# Patient Record
Sex: Male | Born: 1965 | Race: Black or African American | Hispanic: No | Marital: Single | Smoking: Former smoker
Health system: Southern US, Community
[De-identification: ages and names within clinical notes are randomized; demographics above are authoritative.]

## PROBLEM LIST (undated history)

## (undated) DIAGNOSIS — E669 Obesity, unspecified: Secondary | ICD-10-CM

## (undated) DIAGNOSIS — E785 Hyperlipidemia, unspecified: Secondary | ICD-10-CM

## (undated) DIAGNOSIS — Z9189 Other specified personal risk factors, not elsewhere classified: Secondary | ICD-10-CM

## (undated) DIAGNOSIS — E119 Type 2 diabetes mellitus without complications: Secondary | ICD-10-CM

## (undated) HISTORY — DX: Type 2 diabetes mellitus without complications: E11.9

## (undated) HISTORY — PX: COLONOSCOPY: SHX174

## (undated) HISTORY — PX: NO PAST SURGERIES: SHX2092

## (undated) HISTORY — DX: Other specified personal risk factors, not elsewhere classified: Z91.89

## (undated) HISTORY — DX: Hyperlipidemia, unspecified: E78.5

---

## 2004-12-17 ENCOUNTER — Emergency Department (HOSPITAL_COMMUNITY): Admission: EM | Admit: 2004-12-17 | Discharge: 2004-12-17 | Payer: Self-pay | Admitting: Emergency Medicine

## 2007-01-07 ENCOUNTER — Emergency Department (HOSPITAL_COMMUNITY): Admission: EM | Admit: 2007-01-07 | Discharge: 2007-01-07 | Payer: Self-pay | Admitting: *Deleted

## 2007-01-12 ENCOUNTER — Emergency Department (HOSPITAL_COMMUNITY): Admission: EM | Admit: 2007-01-12 | Discharge: 2007-01-12 | Payer: Self-pay | Admitting: Family Medicine

## 2008-04-30 ENCOUNTER — Emergency Department (HOSPITAL_COMMUNITY): Admission: EM | Admit: 2008-04-30 | Discharge: 2008-04-30 | Payer: Self-pay | Admitting: Emergency Medicine

## 2012-08-21 ENCOUNTER — Emergency Department (INDEPENDENT_AMBULATORY_CARE_PROVIDER_SITE_OTHER): Payer: Self-pay

## 2012-08-21 ENCOUNTER — Emergency Department (INDEPENDENT_AMBULATORY_CARE_PROVIDER_SITE_OTHER)
Admission: EM | Admit: 2012-08-21 | Discharge: 2012-08-21 | Disposition: A | Discharge status: Home / Self Care | Payer: Self-pay | Source: Home / Self Care | Attending: Family Medicine | Admitting: Family Medicine

## 2012-08-21 DIAGNOSIS — S93402A Sprain of unspecified ligament of left ankle, initial encounter: Secondary | ICD-10-CM

## 2012-08-21 DIAGNOSIS — S93409A Sprain of unspecified ligament of unspecified ankle, initial encounter: Secondary | ICD-10-CM

## 2012-08-21 MED ORDER — HYDROCODONE-ACETAMINOPHEN 5-500 MG PO TABS: 1.0000 | ORAL_TABLET | Freq: Four times a day (QID) | ORAL | Status: DC | PRN

## 2012-08-21 MED ORDER — IBUPROFEN 600 MG PO TABS: 600.0000 mg | ORAL_TABLET | Freq: Three times a day (TID) | ORAL | Status: DC | PRN

## 2012-08-21 MED ORDER — HYDROCODONE-ACETAMINOPHEN 5-325 MG PO TABS
ORAL_TABLET | ORAL | Status: AC
  Filled 2012-08-21: qty 1

## 2012-08-21 MED ORDER — HYDROCODONE-ACETAMINOPHEN 5-325 MG PO TABS
1.0000 | ORAL_TABLET | Freq: Once | ORAL | Status: AC
  Administered 2012-08-21: 1 via ORAL
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
## 2014-09-20 ENCOUNTER — Inpatient Hospital Stay (HOSPITAL_COMMUNITY)
Admission: EM | Admit: 2014-09-20 | Discharge: 2014-09-22 | DRG: 638 | Disposition: A | Discharge status: Home / Self Care | Payer: Self-pay | Attending: Internal Medicine | Admitting: Internal Medicine

## 2014-09-20 ENCOUNTER — Encounter (HOSPITAL_COMMUNITY): Payer: Self-pay | Admitting: Emergency Medicine

## 2014-09-20 DIAGNOSIS — Z87891 Personal history of nicotine dependence: Secondary | ICD-10-CM

## 2014-09-20 DIAGNOSIS — R739 Hyperglycemia, unspecified: Secondary | ICD-10-CM | POA: Diagnosis present

## 2014-09-20 DIAGNOSIS — E119 Type 2 diabetes mellitus without complications: Secondary | ICD-10-CM

## 2014-09-20 DIAGNOSIS — N19 Unspecified kidney failure: Secondary | ICD-10-CM | POA: Insufficient documentation

## 2014-09-20 DIAGNOSIS — N289 Disorder of kidney and ureter, unspecified: Secondary | ICD-10-CM

## 2014-09-20 DIAGNOSIS — E8729 Other acidosis: Secondary | ICD-10-CM | POA: Diagnosis present

## 2014-09-20 DIAGNOSIS — B351 Tinea unguium: Secondary | ICD-10-CM | POA: Diagnosis present

## 2014-09-20 DIAGNOSIS — E872 Acidosis: Secondary | ICD-10-CM | POA: Diagnosis present

## 2014-09-20 DIAGNOSIS — N179 Acute kidney failure, unspecified: Secondary | ICD-10-CM | POA: Diagnosis present

## 2014-09-20 DIAGNOSIS — Z6835 Body mass index (BMI) 35.0-35.9, adult: Secondary | ICD-10-CM

## 2014-09-20 DIAGNOSIS — E860 Dehydration: Secondary | ICD-10-CM | POA: Diagnosis present

## 2014-09-20 DIAGNOSIS — E669 Obesity, unspecified: Secondary | ICD-10-CM | POA: Diagnosis present

## 2014-09-20 DIAGNOSIS — E1310 Other specified diabetes mellitus with ketoacidosis without coma: Principal | ICD-10-CM | POA: Diagnosis present

## 2014-09-20 DIAGNOSIS — R748 Abnormal levels of other serum enzymes: Secondary | ICD-10-CM | POA: Diagnosis present

## 2014-09-20 DIAGNOSIS — N189 Chronic kidney disease, unspecified: Secondary | ICD-10-CM | POA: Diagnosis present

## 2014-09-20 HISTORY — DX: Obesity, unspecified: E66.9

## 2014-09-20 LAB — COMPREHENSIVE METABOLIC PANEL
ALBUMIN: 4 g/dL (ref 3.5–5.2)
ALT: 20 U/L (ref 0–53)
ANION GAP: 17 — AB (ref 5–15)
AST: 19 U/L (ref 0–37)
Alkaline Phosphatase: 244 U/L — ABNORMAL HIGH (ref 39–117)
BILIRUBIN TOTAL: 1.2 mg/dL (ref 0.3–1.2)
BUN: 11 mg/dL (ref 6–23)
CHLORIDE: 94 meq/L — AB (ref 96–112)
CO2: 18 mmol/L — AB (ref 19–32)
CREATININE: 1.26 mg/dL (ref 0.50–1.35)
Calcium: 9.4 mg/dL (ref 8.4–10.5)
GFR calc non Af Amer: 66 mL/min — ABNORMAL LOW (ref >90)
GFR, EST AFRICAN AMERICAN: 77 mL/min — AB (ref >90)
GLUCOSE: 661 mg/dL — AB (ref 70–99)
Potassium: 4.4 mmol/L (ref 3.5–5.1)
SODIUM: 129 mmol/L — AB (ref 135–145)
Total Protein: 7.1 g/dL (ref 6.0–8.3)

## 2014-09-20 LAB — URINALYSIS, ROUTINE W REFLEX MICROSCOPIC
BILIRUBIN URINE: NEGATIVE
Glucose, UA: >1000 mg/dL — AB
KETONES UR: 40 mg/dL — AB
LEUKOCYTES UA: NEGATIVE
Nitrite: NEGATIVE
PROTEIN: NEGATIVE mg/dL
SPECIFIC GRAVITY, URINE: 1.035 — AB (ref 1.005–1.030)
UROBILINOGEN UA: 0.2 mg/dL (ref 0.0–1.0)
pH: 5 (ref 5.0–8.0)

## 2014-09-20 LAB — BASIC METABOLIC PANEL
ANION GAP: 14 (ref 5–15)
Anion gap: 7 (ref 5–15)
BUN: 8 mg/dL (ref 6–23)
BUN: 6 mg/dL (ref 6–23)
CALCIUM: 8.3 mg/dL — AB (ref 8.4–10.5)
CHLORIDE: 103 meq/L (ref 96–112)
CO2: 19 mmol/L (ref 19–32)
CO2: 20 mmol/L (ref 19–32)
CREATININE: 1.1 mg/dL (ref 0.50–1.35)
Calcium: 8.6 mg/dL (ref 8.4–10.5)
Chloride: 109 mEq/L (ref 96–112)
Creatinine, Ser: 0.93 mg/dL (ref 0.50–1.35)
GFR calc Af Amer: >90 mL/min (ref >90)
GFR calc Af Amer: >90 mL/min (ref >90)
GFR calc non Af Amer: >90 mL/min (ref >90)
GFR, EST NON AFRICAN AMERICAN: 78 mL/min — AB (ref >90)
GLUCOSE: 253 mg/dL — AB (ref 70–99)
GLUCOSE: 162 mg/dL — AB (ref 70–99)
Potassium: 3.4 mmol/L — ABNORMAL LOW (ref 3.5–5.1)
Potassium: 3.2 mmol/L — ABNORMAL LOW (ref 3.5–5.1)
SODIUM: 136 mmol/L (ref 135–145)
SODIUM: 136 mmol/L (ref 135–145)

## 2014-09-20 LAB — GLUCOSE, CAPILLARY
GLUCOSE-CAPILLARY: 132 mg/dL — AB (ref 70–99)
GLUCOSE-CAPILLARY: 134 mg/dL — AB (ref 70–99)
Glucose-Capillary: 128 mg/dL — ABNORMAL HIGH (ref 70–99)
Glucose-Capillary: 130 mg/dL — ABNORMAL HIGH (ref 70–99)

## 2014-09-20 LAB — CBG MONITORING, ED
GLUCOSE-CAPILLARY: 406 mg/dL — AB (ref 70–99)
GLUCOSE-CAPILLARY: 249 mg/dL — AB (ref 70–99)
GLUCOSE-CAPILLARY: 167 mg/dL — AB (ref 70–99)
Glucose-Capillary: 582 mg/dL (ref 70–99)
Glucose-Capillary: 305 mg/dL — ABNORMAL HIGH (ref 70–99)
Glucose-Capillary: 244 mg/dL — ABNORMAL HIGH (ref 70–99)
Glucose-Capillary: 211 mg/dL — ABNORMAL HIGH (ref 70–99)
Glucose-Capillary: 197 mg/dL — ABNORMAL HIGH (ref 70–99)
Glucose-Capillary: 148 mg/dL — ABNORMAL HIGH (ref 70–99)

## 2014-09-20 LAB — URINE MICROSCOPIC-ADD ON

## 2014-09-20 LAB — CBC
HCT: 42.8 % (ref 39.0–52.0)
HEMOGLOBIN: 14.8 g/dL (ref 13.0–17.0)
MCH: 30.5 pg (ref 26.0–34.0)
MCHC: 34.6 g/dL (ref 30.0–36.0)
MCV: 88.2 fL (ref 78.0–100.0)
Platelets: 150 10*3/uL (ref 150–400)
RBC: 4.85 MIL/uL (ref 4.22–5.81)
RDW: 12.5 % (ref 11.5–15.5)
WBC: 7.3 10*3/uL (ref 4.0–10.5)

## 2014-09-20 LAB — MRSA PCR SCREENING: MRSA BY PCR: NEGATIVE

## 2014-09-20 LAB — HEMOGLOBIN A1C
HEMOGLOBIN A1C: 13.2 % — AB (ref <5.7)
MEAN PLASMA GLUCOSE: 332 mg/dL — AB (ref <117)

## 2014-09-20 LAB — I-STAT VENOUS BLOOD GAS, ED
Acid-base deficit: 7 mmol/L — ABNORMAL HIGH (ref 0.0–2.0)
BICARBONATE: 17.8 meq/L — AB (ref 20.0–24.0)
O2 Saturation: 93 %
PCO2 VEN: 32.2 mmHg — AB (ref 45.0–50.0)
PH VEN: 7.351 — AB (ref 7.250–7.300)
PO2 VEN: 70 mmHg — AB (ref 30.0–45.0)
TCO2: 19 mmol/L (ref 0–100)

## 2014-09-20 MED ORDER — INSULIN GLARGINE 100 UNIT/ML ~~LOC~~ SOLN
10.0000 [IU] | Freq: Every day | SUBCUTANEOUS | Status: DC
  Administered 2014-09-20: 10 [IU] via SUBCUTANEOUS
  Filled 2014-09-20 (×2): qty 0.1

## 2014-09-20 MED ORDER — INSULIN ASPART 100 UNIT/ML ~~LOC~~ SOLN
0.0000 [IU] | Freq: Three times a day (TID) | SUBCUTANEOUS | Status: DC
  Administered 2014-09-21: 3 [IU] via SUBCUTANEOUS
  Administered 2014-09-21: 7 [IU] via SUBCUTANEOUS

## 2014-09-20 MED ORDER — ACETAMINOPHEN 650 MG RE SUPP: 650.0000 mg | Freq: Four times a day (QID) | RECTAL | Status: DC | PRN

## 2014-09-20 MED ORDER — ONDANSETRON HCL 4 MG/2ML IJ SOLN: 4.0000 mg | Freq: Four times a day (QID) | INTRAMUSCULAR | Status: DC | PRN

## 2014-09-20 MED ORDER — HEPARIN SODIUM (PORCINE) 5000 UNIT/ML IJ SOLN
5000.0000 [IU] | Freq: Three times a day (TID) | INTRAMUSCULAR | Status: DC
  Administered 2014-09-20 – 2014-09-22 (×5): 5000 [IU] via SUBCUTANEOUS
  Filled 2014-09-20 (×10): qty 1

## 2014-09-20 MED ORDER — DEXTROSE-NACL 5-0.45 % IV SOLN
INTRAVENOUS | Status: DC
  Administered 2014-09-20: 14:00:00 via INTRAVENOUS

## 2014-09-20 MED ORDER — INSULIN GLARGINE 100 UNIT/ML ~~LOC~~ SOLN
10.0000 [IU] | Freq: Every day | SUBCUTANEOUS | Status: DC
  Filled 2014-09-20: qty 0.1

## 2014-09-20 MED ORDER — SODIUM CHLORIDE 0.9 % IV SOLN
1000.0000 mL | Freq: Once | INTRAVENOUS | Status: AC
  Administered 2014-09-20: 1000 mL via INTRAVENOUS

## 2014-09-20 MED ORDER — HEPARIN SODIUM (PORCINE) 5000 UNIT/ML IJ SOLN
5000.0000 [IU] | Freq: Three times a day (TID) | INTRAMUSCULAR | Status: DC
  Administered 2014-09-20: 5000 [IU] via SUBCUTANEOUS

## 2014-09-20 MED ORDER — ACETAMINOPHEN 325 MG PO TABS: 650.0000 mg | ORAL_TABLET | Freq: Four times a day (QID) | ORAL | Status: DC | PRN

## 2014-09-20 MED ORDER — SODIUM CHLORIDE 0.9 % IV SOLN
INTRAVENOUS | Status: DC
  Administered 2014-09-20: 3.5 [IU]/h via INTRAVENOUS
  Filled 2014-09-20: qty 2.5

## 2014-09-20 MED ORDER — SODIUM CHLORIDE 0.9 % IV SOLN
1000.0000 mL | INTRAVENOUS | Status: DC
  Administered 2014-09-20 – 2014-09-21 (×4): 1000 mL via INTRAVENOUS

## 2014-09-20 MED ORDER — POTASSIUM CHLORIDE 10 MEQ/100ML IV SOLN
10.0000 meq | INTRAVENOUS | Status: AC
  Filled 2014-09-20: qty 100

## 2014-09-20 MED ORDER — POTASSIUM CHLORIDE 10 MEQ/100ML IV SOLN
10.0000 meq | INTRAVENOUS | Status: AC
  Administered 2014-09-20 (×3): 10 meq via INTRAVENOUS
  Filled 2014-09-20: qty 100

## 2014-09-20 MED ORDER — ONDANSETRON HCL 4 MG PO TABS: 4.0000 mg | ORAL_TABLET | Freq: Four times a day (QID) | ORAL | Status: DC | PRN

## 2014-09-20 MED ORDER — SODIUM CHLORIDE 0.9 % IV BOLUS (SEPSIS)
1000.0000 mL | Freq: Once | INTRAVENOUS | Status: AC
  Administered 2014-09-20: 1000 mL via INTRAVENOUS

## 2014-09-20 NOTE — ED Notes (Addendum)
Pt c/o drinking too much water x 1 month. Pt reports drinking about 40-50 cups of water. CBG 582 in triage. Pt does not have history of diabetes.

## 2014-09-20 NOTE — H&P (Signed)
Date: 09/20/2014               Patient Name:  Dustin Hale MRN: [id number]  DOB: 1966/03/09 Age / Sex: 49 y.o., male   PCP: No primary care provider on file.         Medical Service: Internal Medicine Teaching Service         Attending Physician: Dr. Sid Falcon, MD    First Contact: Dr. Posey Pronto Pager: [phone]  Second Contact: Dr. Denton Brick Pager: [phone]       After Hours (After 5p/  First Contact Pager: [phone]  weekends / holidays): Second Contact Pager: [phone]   Chief Complaint: Polyuria, Polydipsia   History of Present Illness:  Dustin Hale is a 49 yr old man with PMH of tobacco use (16 pack-year quit in Dec 2015), obesity, no medical follow up in years, presenting with increased urinary frequency and thirst for the past month. He states that he was in his usual state of health until 1 month ago when his symptoms started. He thought he had an UTI so he took antibiotics from his "buddy" that were unexpired. His symptoms did not improve and he developed intermittent scant white penile discharge but no ulceration, dysuria, or rash. The penile discharge has resolved now. He denies fever/chills, N/V/D, or upper respiratory symptoms. He had chest pain months ago but none recently. He states that he has stopped smoking and drinking alcohol because he was urinating so much. He has no family history of diabetes but reports eating sweets and snacks frequently.   In the ED he was found to be hemodynamically stable with blood glucose of 661 with AG of 17, bicarb of 18, Venous blood gas with pH of 7.351/32/70/bicarb 17, UA with >1000 glucose, 40 ketones, no leucocytes, rare bacteria. He was given 2L NS bolus and promptly started on insulin drip and IVF. The IMTS was called for his hospitalization for further management of his hyperglycemia.   Meds: Current Facility-Administered Medications  Medication Dose Route Frequency Provider Last Rate Last Dose  . 0.9 %  sodium chloride infusion  1,000 mL  Intravenous Once [employer]. Pickering, MD 1,000 mL/hr at 09/20/14 1116 1,000 mL at 09/20/14 1116  . 0.9 %  sodium chloride infusion  1,000 mL Intravenous Continuous Jasper Riling. Pickering, MD      . dextrose 5 %-0.45 % sodium chloride infusion   Intravenous Continuous Jasper Riling. Pickering, MD      . insulin regular (NOVOLIN R,HUMULIN R) 250 Units in sodium chloride 0.9 % 250 mL (1 Units/mL) infusion   Intravenous Continuous Nathan R. Pickering, MD 3.5 mL/hr at 09/20/14 1115 3.5 Units/hr at 09/20/14 1115   No current outpatient prescriptions on file.    Allergies: Allergies as of 09/20/2014  . (No Known Allergies)   Past Medical History  Diagnosis Date  . Obesity    History reviewed. No pertinent past surgical history. Family History  Problem Relation Age of Onset  . Hypertension Mother   . Hypertension Father    History   Social History  . Marital Status: Single    Spouse Name: N/A    Number of Children: N/A  . Years of Education: N/A   Occupational History  . Not on file.   Social History Main Topics  . Smoking status: Former Smoker -- 0.50 packs/day for 32 years    Types: Cigarettes  . Smokeless tobacco: Never Used     Comment: quit smoking on 08/27/14  . Alcohol  Use: No     Comment: Used to drink heavily, quit in 2014  . Drug Use: No     Comment: Former  . Sexual Activity: Not on file   Other Topics Concern  . Not on file   Social History Narrative    Review of Systems: .Review of Systems  Constitutional: Negative for fever, chills, weight loss, malaise/fatigue and diaphoresis.  HENT: Negative for congestion and sore throat.   Eyes: Negative for blurred vision.  Respiratory: Negative for cough and shortness of breath.   Cardiovascular: Negative for chest pain and leg swelling.  Gastrointestinal: Negative for nausea, vomiting, abdominal pain and diarrhea.       Polydipsia   Genitourinary: Positive for frequency. Negative for dysuria and flank pain.        Polyuria   Musculoskeletal: Negative for myalgias.  Skin: Negative for rash.  Neurological: Negative for dizziness, weakness and headaches.  Psychiatric/Behavioral: Negative for depression. The patient is not nervous/anxious.      Physical Exam: Blood pressure 128/76, pulse 84, temperature 98.4 F (36.9 C), temperature source Oral, resp. rate 16, height _0  (1.753 m), weight 240 lb (108.863 kg), SpO2 97 %. Physical Exam  Nursing note and vitals reviewed. Constitutional: He is oriented to person, place, and time. No distress.  Obese  HENT:  Head: Normocephalic and atraumatic.  Mouth/Throat: Oropharynx is clear and moist. No oropharyngeal exudate.  Moist MM  Eyes: Conjunctivae and EOM are normal. Pupils are equal, round, and reactive to light. Right eye exhibits no discharge. Left eye exhibits no discharge. No scleral icterus.  Neck: Neck supple.  Cardiovascular: Normal rate and regular rhythm.   Respiratory: Effort normal and breath sounds normal. No respiratory distress. He has no wheezes. He has no rales.  GI: Soft. Bowel sounds are normal. There is no tenderness. There is no rebound and no guarding.  Genitourinary:  Exam deferred    Musculoskeletal: He exhibits no edema or tenderness.  Lymphadenopathy:    He has no cervical adenopathy.  Neurological: He is alert and oriented to person, place, and time.  Skin: Skin is warm and dry. He is not diaphoretic. No erythema.  Psychiatric: He has a normal mood and affect.     Lab results: Basic Metabolic Panel:  Recent Labs  09/20/14 0830  NA 129*  K 4.4  CL 94*  CO2 18*  GLUCOSE 661*  BUN 11  CREATININE 1.26  CALCIUM 9.4   Liver Function Tests:  Recent Labs  09/20/14 0830  AST 19  ALT 20  ALKPHOS 244*  BILITOT 1.2  PROT 7.1  ALBUMIN 4.0   CBC:  Recent Labs  09/20/14 0830  WBC 7.3  HGB 14.8  HCT 42.8  MCV 88.2  PLT 150   CBG:  Recent Labs  09/20/14 0730 09/20/14 1110  GLUCAP 582* 406*    Urinalysis:  Recent Labs  09/20/14 0824  COLORURINE YELLOW  LABSPEC 1.035*  PHURINE 5.0  GLUCOSEU >1000*  HGBUR TRACE*  BILIRUBINUR NEGATIVE  KETONESUR 40*  PROTEINUR NEGATIVE  UROBILINOGEN 0.2  NITRITE NEGATIVE  LEUKOCYTESUR NEGATIVE     Assessment & Plan by Problem: 49 yr old man with PMH of tobacco smoking, obesity, with no recent medical follow up, presenting with symptomatic hyperglycemia with increased AG metabolic acidosis, likely due to new onset DM2.   Hyperglycemia: Blood glucose of 661 on presentation. Has had polydipsia and polyuria for one month. Risk factor to include obesity, family history. No signs of acute infection that  could have triggered his hyperglycemia.  -Admit to SDU -Continue insulin drip with CBG q1h -BMET q4h -NS at 113m.hr, followed by D5NS if CBG <250 -After AG gap with 2 repeated BMETs, give Lantus 10 units, wait 1-2 hr for pt to eat carb mod diet then start SSI -Gentle potassium repletion as needed (based on BMET) given his decrease in GFR -NPO for now -Check HgbA1c  -Pending HgA1c, start diabetes medications, order diabetic teaching/diabetes coordinator consult -Will need PCP follow up, former [facility] patient, [facility]  Renal insufficiency, AKI v CKD: Pt has not had medical follow up with no labs in Epic. His Cr at presentation is 1.26 with GFR of 66. He may have chronic kidney disease due to uncontrolled diabetes.  -trend creatinine after IVF -Gentle potassium repletion   Elevated Alkaline phosphatase: No baseline. No hx of liver disease with normal AST, ALT, and bilirubin of only 1.2. He has no abdominal pain, N/V, or signs of obstruction. Isolated elevation of Alk phos can be seen with bone disease and renal disease as well (he may have CKD per above discussion) -CMP in am  Hx ofTobacco use: Has history of 16 pack-year cigarette smoking, quit on 08/27/14. Does not feel the need for  nicotine patch at this time.    DVT prophylaxis: Heparin [place] TID  FEN:  NS 1235mhr  Follow BMET, replete potassium gently as needed NPO for now, Carb mod diet once gap is closed x2 BMETs   Dispo: Disposition is deferred at this time, awaiting improvement of current medical problems. Anticipated discharge in approximately 1-2 day(s).   The patient does not have a current PCP and [facility].  The patient does not have transportation limitations that hinder transportation to clinic appointments.  Signed: SoBlain PaisMD  IMTS, PGY3 09/20/2014, 11:37 AM

## 2014-09-20 NOTE — ED Provider Notes (Signed)
CSN: [id number]     Arrival date & time 09/20/14  0715 History   First MD Initiated Contact with Patient 09/20/14 0813     Chief Complaint  Patient presents with  . Polydipsia     (Consider location/radiation/quality/duration/timing/severity/associated sxs/prior Treatment) The history is provided by the patient.   patient states that for the last month and a half he's been urinating frequently. States he goes all the time. He asked of water with him. He states he has some white penile discharge. No dysuria. No fevers. He states he feels as if his legs walk slow some time. No history of diabetes. He has had rare chest pain. No chest pain now. He states that he stop smoking and drinking because he was urinating so much. He states it did not help. No fevers.  Past Medical History  Diagnosis Date  . Obesity    History reviewed. No pertinent past surgical history. Family History  Problem Relation Age of Onset  . Hypertension Mother   . Hypertension Father    History  Substance Use Topics  . Smoking status: Former Smoker -- 0.50 packs/day for 32 years    Types: Cigarettes  . Smokeless tobacco: Never Used     Comment: quit smoking on 08/27/14  . Alcohol Use: No     Comment: Used to drink heavily, quit in 2014    Review of Systems  Constitutional: Negative for activity change and appetite change.  Eyes: Negative for pain.  Respiratory: Negative for chest tightness and shortness of breath.   Cardiovascular: Negative for chest pain and leg swelling.  Gastrointestinal: Negative for nausea, vomiting, abdominal pain and diarrhea.  Endocrine: Positive for polydipsia, polyphagia and polyuria.  Genitourinary: Positive for discharge. Negative for flank pain.  Musculoskeletal: Negative for back pain and neck stiffness.  Skin: Negative for rash.  Neurological: Negative for weakness, numbness and headaches.  Psychiatric/Behavioral: Negative for behavioral problems.      Allergies   Review of patient's allergies indicates no known allergies.  Home Medications   Prior to Admission medications   Not on File   BP 121/87 mmHg  Pulse 92  Temp(Src) 98.4 F (36.9 C) (Oral)  Resp 16  Ht 5\' 9"  (1.753 m)  Wt 240 lb (108.863 kg)  BMI 35.43 kg/m2  SpO2 98% Physical Exam  Constitutional: He is oriented to person, place, and time. He appears well-developed and well-nourished.  HENT:  Head: Normocephalic and atraumatic.  Neck: Normal range of motion.  Cardiovascular: Regular rhythm and normal heart sounds.   No murmur heard. Mild tachycardia.  Pulmonary/Chest: Effort normal and breath sounds normal.  Abdominal: Soft. Bowel sounds are normal. He exhibits no distension and no mass. There is no tenderness. There is no rebound and no guarding.  Musculoskeletal: Normal range of motion. He exhibits no edema.  Neurological: He is alert and oriented to person, place, and time. No cranial nerve deficit.  Skin: Skin is warm and dry.  Psychiatric: He has a normal mood and affect.  Nursing note and vitals reviewed.   ED Course  Procedures (including critical care time) Labs Review Labs Reviewed  COMPREHENSIVE METABOLIC PANEL - Abnormal; Notable for the following:    Sodium 129 (*)    Chloride 94 (*)    CO2 18 (*)    Glucose, Bld 661 (*)    Alkaline Phosphatase 244 (*)    GFR calc non Af Amer 66 (*)    GFR calc Af Amer 77 (*)  Anion gap 17 (*)    All other components within normal limits  URINALYSIS, ROUTINE W REFLEX MICROSCOPIC - Abnormal; Notable for the following:    Specific Gravity, Urine 1.035 (*)    Glucose, UA >1000 (*)    Hgb urine dipstick TRACE (*)    Ketones, ur 40 (*)    All other components within normal limits  CBG MONITORING, ED - Abnormal; Notable for the following:    Glucose-Capillary 582 (*)    All other components within normal limits  I-STAT VENOUS BLOOD GAS, ED - Abnormal; Notable for the following:    pH, Ven 7.351 (*)    pCO2, Ven 32.2  (*)    pO2, Ven 70.0 (*)    Bicarbonate 17.8 (*)    Acid-base deficit 7.0 (*)    All other components within normal limits  CBG MONITORING, ED - Abnormal; Notable for the following:    Glucose-Capillary 406 (*)    All other components within normal limits  CBG MONITORING, ED - Abnormal; Notable for the following:    Glucose-Capillary 305 (*)    All other components within normal limits  CBG MONITORING, ED - Abnormal; Notable for the following:    Glucose-Capillary 249 (*)    All other components within normal limits  CBG MONITORING, ED - Abnormal; Notable for the following:    Glucose-Capillary 244 (*)    All other components within normal limits  CBC  URINE MICROSCOPIC-ADD ON  HEMOGLOBIN I9J  BASIC METABOLIC PANEL  BASIC METABOLIC PANEL  BASIC METABOLIC PANEL  BASIC METABOLIC PANEL    Imaging Review No results found.   EKG Interpretation None      MDM   Final diagnoses:  Diabetic ketoacidosis without coma associated with other specified diabetes mellitus    Patient with new onset diabetes. Mild DKA. No primary care doctor for follow-up. Will admit to internal medicine.    Jasper Riling. Alvino Chapel, MD 09/20/14 [phone]

## 2014-09-20 NOTE — ED Notes (Signed)
Family at bedside. 

## 2014-09-21 LAB — BASIC METABOLIC PANEL
ANION GAP: 7 (ref 5–15)
BUN: 6 mg/dL (ref 6–23)
CALCIUM: 8.1 mg/dL — AB (ref 8.4–10.5)
CHLORIDE: 109 meq/L (ref 96–112)
CO2: 21 mmol/L (ref 19–32)
Creatinine, Ser: 0.84 mg/dL (ref 0.50–1.35)
GFR calc non Af Amer: >90 mL/min (ref >90)
Glucose, Bld: 137 mg/dL — ABNORMAL HIGH (ref 70–99)
Potassium: 3 mmol/L — ABNORMAL LOW (ref 3.5–5.1)
Sodium: 137 mmol/L (ref 135–145)

## 2014-09-21 LAB — GLUCOSE, CAPILLARY
GLUCOSE-CAPILLARY: 174 mg/dL — AB (ref 70–99)
GLUCOSE-CAPILLARY: 232 mg/dL — AB (ref 70–99)
Glucose-Capillary: 342 mg/dL — ABNORMAL HIGH (ref 70–99)
Glucose-Capillary: 347 mg/dL — ABNORMAL HIGH (ref 70–99)
Glucose-Capillary: 245 mg/dL — ABNORMAL HIGH (ref 70–99)

## 2014-09-21 LAB — COMPREHENSIVE METABOLIC PANEL
ALBUMIN: 2.9 g/dL — AB (ref 3.5–5.2)
ALK PHOS: 139 U/L — AB (ref 39–117)
ALT: 19 U/L (ref 0–53)
AST: 22 U/L (ref 0–37)
Anion gap: 3 — ABNORMAL LOW (ref 5–15)
BILIRUBIN TOTAL: 0.8 mg/dL (ref 0.3–1.2)
BUN: 5 mg/dL — AB (ref 6–23)
CO2: 26 mmol/L (ref 19–32)
Calcium: 8 mg/dL — ABNORMAL LOW (ref 8.4–10.5)
Chloride: 105 mEq/L (ref 96–112)
Creatinine, Ser: 1.11 mg/dL (ref 0.50–1.35)
GFR calc non Af Amer: 77 mL/min — ABNORMAL LOW (ref >90)
GFR, EST AFRICAN AMERICAN: 90 mL/min — AB (ref >90)
Glucose, Bld: 429 mg/dL — ABNORMAL HIGH (ref 70–99)
Potassium: 4 mmol/L (ref 3.5–5.1)
SODIUM: 134 mmol/L — AB (ref 135–145)
Total Protein: 5.3 g/dL — ABNORMAL LOW (ref 6.0–8.3)

## 2014-09-21 LAB — MAGNESIUM: Magnesium: 1.7 mg/dL (ref 1.5–2.5)

## 2014-09-21 MED ORDER — INSULIN ASPART 100 UNIT/ML ~~LOC~~ SOLN
0.0000 [IU] | SUBCUTANEOUS | Status: DC
  Administered 2014-09-21: 5 [IU] via SUBCUTANEOUS
  Administered 2014-09-21: 11 [IU] via SUBCUTANEOUS
  Administered 2014-09-22 (×3): 3 [IU] via SUBCUTANEOUS

## 2014-09-21 MED ORDER — INSULIN ASPART PROT & ASPART (70-30 MIX) 100 UNIT/ML ~~LOC~~ SUSP
15.0000 [IU] | Freq: Every day | SUBCUTANEOUS | Status: DC
  Filled 2014-09-21: qty 10

## 2014-09-21 MED ORDER — INSULIN ASPART PROT & ASPART (70-30 MIX) 100 UNIT/ML ~~LOC~~ SUSP
17.0000 [IU] | Freq: Two times a day (BID) | SUBCUTANEOUS | Status: DC
  Administered 2014-09-21: 17 [IU] via SUBCUTANEOUS
  Filled 2014-09-21: qty 10

## 2014-09-21 MED ORDER — TERBINAFINE HCL 250 MG PO TABS
250.0000 mg | ORAL_TABLET | Freq: Every day | ORAL | Status: DC
  Administered 2014-09-21 – 2014-09-22 (×2): 250 mg via ORAL
  Filled 2014-09-21 (×2): qty 1

## 2014-09-21 MED ORDER — POTASSIUM CHLORIDE 10 MEQ/100ML IV SOLN
10.0000 meq | INTRAVENOUS | Status: AC
  Administered 2014-09-21 (×4): 10 meq via INTRAVENOUS
  Filled 2014-09-21 (×4): qty 100

## 2014-09-21 MED ORDER — INSULIN ASPART 100 UNIT/ML ~~LOC~~ SOLN
7.0000 [IU] | Freq: Once | SUBCUTANEOUS | Status: AC
  Administered 2014-09-21: 7 [IU] via SUBCUTANEOUS

## 2014-09-21 MED ORDER — SODIUM CHLORIDE 0.9 % IV SOLN: 1000.0000 mL | INTRAVENOUS | Status: AC

## 2014-09-21 NOTE — Discharge Summary (Signed)
Name: Dustin Hale MRN: [id number] DOB: September 14, 1965 49 y.o. PCP: No primary care provider on file.  Date of Admission: 09/20/2014  8:05 AM Date of Discharge: 09/22/2014 Attending Physician: Dustin Falcon, MD  Discharge Diagnosis: Diabetic ketoacidosis Poorly controlled DM2 Onychomycosis Renal insufficiency Elevated alkaline phosphatase History of tobacco abuse   Discharge Medications:   Medication List    TAKE these medications        blood glucose meter kit and supplies Kit  Dispense based on patient and insurance preference. Use up to four times daily as directed. (FOR ICD-9 250.00, 250.01).     glucose blood test strip  Use as instructed     insulin NPH-regular Human (70-30) 100 UNIT/ML injection  Commonly known as:  NOVOLIN 70/30 RELION  Inject 20 Units into the skin 2 (two) times daily with a meal.     Insulin Pen Needle 32G X 5 MM Misc  1 Units by Does not apply route daily.     Lancets Thin Misc  1 Units by Does not apply route daily.     metFORMIN 500 MG tablet  Commonly known as:  GLUCOPHAGE  Take 1 tablet (500 mg total) by mouth 2 (two) times daily with a meal.     terbinafine 250 MG tablet  Commonly known as:  LAMISIL  Take 1 tablet (250 mg total) by mouth daily.        Disposition and follow-up:   DustinDaryl Hale was discharged from [facility] in Stable condition.  At the hospital follow up visit please address:  -Enrollment in insurance -DM2: adjustment of his insulin, lifestyle modification, tolerance of metformin -Onychomycosis: tolerance of terbinafine  2.  Labs / imaging needed at time of follow-up: none  3.  Pending labs/ test needing follow-up: none  Follow-up Appointments: Follow-up Information    Follow up with Dustin Hale    . Schedule an appointment as soon as possible for a visit in 1 week.   Contact information:   [address] [place]  [address] [phone]      Discharge Instructions: Discharge Instructions    Diet - low sodium heart healthy    Complete by:  As directed      Discharge instructions    Complete by:  As directed   Please follow up with a doctor at the [facility] as soon as you can- within a week, to establish care with a primary care doctor, as you will need to be monitored because you are on insulin. Please to with your glucometer to you appointment.  You will be taking 20units of your insulin two times a day, morning and evening.   Also we have prescribed a medication called metformin for you. Take one tablet once a day, this dose can be increased gradually when you follow up with a doctor. This medication is also used for treatment of diabetes.  We have also prescribed a medication for your toe nails called terbinafine. Take one tablet once a day for 6 weeks.     Increase activity slowly    Complete by:  As directed            Consultations:    Procedures Performed:  No results found.  Admission HPI: Dustin Hale is a 49 yr old man with PMH of tobacco use (16 pack-year quit in Dec 2015), obesity, no medical follow up in years, presenting with increased urinary frequency and thirst for  the past month. He states that he was in his usual state of health until 1 month ago when his symptoms started. He thought he had an UTI so he took antibiotics from his "buddy" that were unexpired. His symptoms did not improve and he developed intermittent scant white penile discharge but no ulceration, dysuria, or rash. The penile discharge has resolved now. He denies fever/chills, N/V/D, or upper respiratory symptoms. He had chest pain months ago but none recently. He states that he has stopped smoking and drinking alcohol because he was urinating so much. He has no family history of diabetes but reports eating sweets and snacks frequently.   In the ED he was found to be hemodynamically stable with blood  glucose of 661 with AG of 17, bicarb of 18, Venous blood gas with pH of 7.351/32/70/bicarb 17, UA with >1000 glucose, 40 ketones, no leucocytes, rare bacteria. He was given 2L NS bolus and promptly started on insulin drip and IVF. The IMTS was called for his hospitalization for further management of his hyperglycemia.   Hospital Course by problem list:   DKA: Anion gap closed & bicarb improved 18->29 with IV fluids and insulin. A1c was found to be 13.2, and he was diagnosed with DM2. Given that he was in the process of applying for insurance, he was transitioned to Novolog 70/30 given its affordability and started on 17 units BID [based on ~50 units needed for the first 24 hours with CBGs trending in the 200s]. On this regimen CBGs trended upper 100s, so his dose at discharge was increased to 20 units BID and metformin 526m BID. DM coordinator was consulted for insulin management and checking blood sugars. Case Management was also consulted to help arrange PCP follow-up for him. On the day of discharge, he confirmed that he verbalized his understanding of the management of his new diagnosis.  Onychomycosis: 2/2 poorly controlled DM2. He was started on terbinafine 4021mBID to complete a total four-week course of therapy.   Acute kidney injury: Crt on admission 1.3 improved to 0.8 with IV fluids which yields an estimated GFR >90.   Elevated alkaline phosphatase: Initially 244 on admission though trended down 139 on hospital day 2. He did not report any signs of abdominal pain during his hospital stay and should be reassessed at follow-up.   History of tobacco abuse: He declined any kind of nicotine replacement therapy as he had recently quit on 08/27/14.    Discharge Vitals:   BP 102/60 mmHg  Pulse 78  Temp(Src) 98.4 F (36.9 C) (Oral)  Resp 18  Ht 5' 9"  (1.753 m)  Wt 238 lb (107.956 kg)  BMI 35.13 kg/m2  SpO2 99%  Discharge Labs:  No results found for this or any previous visit (from the  past 24 hour(s)).  Signed: RuCharlott RakesMD 09/28/2014, 7:56 AM    Services Ordered on Discharge: None Equipment Ordered on Discharge: None

## 2014-09-21 NOTE — Plan of Care (Signed)
Problem: Phase I Progression Outcomes Goal: Other Phase I Outcomes/Goals Outcome: Progressing Taught patient to draw up and give his own insulin in case he goes home on it.

## 2014-09-21 NOTE — Progress Notes (Signed)
Subjective:  This AM, he is eating breakfast and reports feeling better. We explained to him that he had diabetes and that he would need insulin. His friend also uses insulin. He also reports he might have toe fungus which we explain will improve with medication and controlling blood sugars.  Objective: Vital signs in last 24 hours: Filed Vitals:   09/21/14 0007 09/21/14 0420 09/21/14 0500 09/21/14 0730  BP: 114/84 106/73  125/83  Pulse: 65 82  97  Temp: 98 F (36.7 C) 98.3 F (36.8 C)  98.2 F (36.8 C)  TempSrc: Oral Oral  Oral  Resp: 13 20  20   Height:      Weight:      SpO2: 97% 87% 96% 98%   Weight change:   Intake/Output Summary (Last 24 hours) at 09/21/14 0931 Last data filed at 09/21/14 0800  Gross per 24 hour  Intake   1515 ml  Output   1200 ml  Net    315 ml   General: resting in bed, eating breakfast HEENT: PERRL, EOMI, no scleral icterus Cardiac: RRR, no rubs, murmurs or gallops Pulm: clear to auscultation bilaterally, no wheezes, rales, or rhonchi Abd: soft, nontender, nondistended, BS present Ext: warm and well perfused, no pedal edema, feet bilaterally with lesions underlying nails that appears consistent with onychomycosis  Neuro: responds to questions appropriately; moving all extremities freely   Lab Results: Basic Metabolic Panel:  Recent Labs Lab 09/20/14 2321 09/21/14 0321  NA 137 134*  K 3.0* 4.0  CL 109 105  CO2 21 26  GLUCOSE 137* 429*  BUN 6 5*  CREATININE 0.84 1.11  CALCIUM 8.1* 8.0*  MG  --  1.7   Liver Function Tests:  Recent Labs Lab 09/20/14 0830 09/21/14 0321  AST 19 22  ALT 20 19  ALKPHOS 244* 139*  BILITOT 1.2 0.8  PROT 7.1 5.3*  ALBUMIN 4.0 2.9*   CBC:  Recent Labs Lab 09/20/14 0830  WBC 7.3  HGB 14.8  HCT 42.8  MCV 88.2  PLT 150   CBG:  Recent Labs Lab 09/20/14 1930 09/20/14 2039 09/20/14 2147 09/20/14 2244 09/20/14 2344 09/21/14 0736  GLUCAP 167* 132* 128* 130* 134* 232*   Hemoglobin  A1C:  Recent Labs Lab 09/20/14 1500  HGBA1C 13.2*   Urinalysis:  Recent Labs Lab 09/20/14 0824  COLORURINE YELLOW  LABSPEC 1.035*  PHURINE 5.0  GLUCOSEU >1000*  HGBUR TRACE*  BILIRUBINUR NEGATIVE  KETONESUR 40*  PROTEINUR NEGATIVE  UROBILINOGEN 0.2  NITRITE NEGATIVE  LEUKOCYTESUR NEGATIVE    Micro Results: Recent Results (from the past 240 hour(s))  MRSA PCR Screening     Status: None   Collection Time: 09/20/14  8:50 PM  Result Value Ref Range Status   MRSA by PCR NEGATIVE NEGATIVE Final    Comment:        The GeneXpert MRSA Assay (FDA approved for NASAL specimens only), is one component of a comprehensive MRSA colonization surveillance program. It is not intended to diagnose MRSA infection nor to guide or monitor treatment for MRSA infections.    Studies/Results: No results found. Medications: I have reviewed the patient's current medications. Scheduled Meds: . heparin  5,000 Units Subcutaneous 3 times per day  . insulin aspart  0-9 Units Subcutaneous TID WC   Continuous Infusions: . sodium chloride 1,000 mL (09/21/14 0900)   PRN Meds:.acetaminophen **OR** acetaminophen, ondansetron **OR** ondansetron (ZOFRAN) IV Assessment/Plan:  DM2: DKA resolved with gap closed and bicarb 26. A1c 13.2 indicates  poor control and will require insulin at discharge as he is a new diagnosis of DM2. He received total 53.8 units of insulin, 10 of which were Lantus overnight. Correction factor roughly 28 which is suggestive of resistance. As he is in the process of procuring insurance, Novolog 70/30 would be better choice. -Start terbinafine 250mg  BID x 4 weeks for onychomycosis  -Change SSI-S->SSI-M and check CBGs q4h -Given Novolog 70/30 17 units QHS and before breakfast -Consult Diabetic Educator for teaching -Consult Case Manager to assist with setting up follow-up with [facility] as he is interested in going there  Renal insufficiency: Crt trending  0.8-1.1, down from 1.3 on admission. Unsure of baseline for now but dehydration likely contributory to initial value. -Recheck BMET tomorrow AM  Elevated alkaline phosphatase: AP 139 this AM, down from 244. No physical exam findings suggestive of acute obstruction.  -Workup as outpatient   History of tobacco abuse: 16-pack year history of smoking though quit on 08/27/14.  #FEN:  -Diet: Carb Modified  #DVT prophylaxis: heparin 5000 units subcutaneous  #CODE STATUS: FULL CODE   Dispo: Disposition is deferred at this time, awaiting improvement of current medical problems.  Anticipated discharge in approximately 1 day(s).   The patient does not have a current PCP (No primary care provider on file.) and does not need an [facility] hospital follow-up appointment after discharge.  The patient does not know have transportation limitations that hinder transportation to clinic appointments.  .Services Needed at time of discharge: Y = Yes, Blank = No PT:   OT:   RN:   Equipment:   Other:     LOS: 1 day   Charlott Rakes, MD 09/21/2014, 9:31 AM

## 2014-09-22 DIAGNOSIS — B351 Tinea unguium: Secondary | ICD-10-CM

## 2014-09-22 DIAGNOSIS — E1010 Type 1 diabetes mellitus with ketoacidosis without coma: Secondary | ICD-10-CM

## 2014-09-22 DIAGNOSIS — N179 Acute kidney failure, unspecified: Secondary | ICD-10-CM

## 2014-09-22 LAB — BASIC METABOLIC PANEL
ANION GAP: 6 (ref 5–15)
CO2: 25 mmol/L (ref 19–32)
Calcium: 8.8 mg/dL (ref 8.4–10.5)
Chloride: 104 mEq/L (ref 96–112)
Creatinine, Ser: 0.8 mg/dL (ref 0.50–1.35)
GFR calc Af Amer: >90 mL/min (ref >90)
GFR calc non Af Amer: >90 mL/min (ref >90)
GLUCOSE: 318 mg/dL — AB (ref 70–99)
Potassium: 3.3 mmol/L — ABNORMAL LOW (ref 3.5–5.1)
SODIUM: 135 mmol/L (ref 135–145)

## 2014-09-22 LAB — GLUCOSE, CAPILLARY
GLUCOSE-CAPILLARY: 186 mg/dL — AB (ref 70–99)
Glucose-Capillary: 197 mg/dL — ABNORMAL HIGH (ref 70–99)
Glucose-Capillary: 219 mg/dL — ABNORMAL HIGH (ref 70–99)

## 2014-09-22 MED ORDER — BLOOD GLUCOSE MONITOR KIT: PACK | Status: DC

## 2014-09-22 MED ORDER — INSULIN ASPART PROT & ASPART (70-30 MIX) 100 UNIT/ML ~~LOC~~ SUSP
20.0000 [IU] | Freq: Two times a day (BID) | SUBCUTANEOUS | Status: DC
  Administered 2014-09-22: 20 [IU] via SUBCUTANEOUS

## 2014-09-22 MED ORDER — INSULIN ASPART PROT & ASPART (70-30 MIX) 100 UNIT/ML ~~LOC~~ SUSP: 20.0000 [IU] | Freq: Two times a day (BID) | SUBCUTANEOUS | Status: DC

## 2014-09-22 MED ORDER — INSULIN NPH ISOPHANE & REGULAR (70-30) 100 UNIT/ML ~~LOC~~ SUSP: 20.0000 [IU] | Freq: Two times a day (BID) | SUBCUTANEOUS | Status: DC

## 2014-09-22 MED ORDER — LANCETS THIN MISC: 1.0000 [IU] | Freq: Every day | Status: DC

## 2014-09-22 MED ORDER — GLUCOSE BLOOD VI STRP: ORAL_STRIP | Status: DC

## 2014-09-22 MED ORDER — METFORMIN HCL 500 MG PO TABS: 500.0000 mg | ORAL_TABLET | Freq: Two times a day (BID) | ORAL | Status: DC

## 2014-09-22 MED ORDER — TERBINAFINE HCL 250 MG PO TABS: 250.0000 mg | ORAL_TABLET | Freq: Every day | ORAL | Status: DC

## 2014-09-22 MED ORDER — LIVING WELL WITH DIABETES BOOK
Freq: Once | Status: AC
  Administered 2014-09-22: 10:00:00
  Filled 2014-09-22 (×2): qty 1

## 2014-09-22 MED ORDER — INSULIN STARTER KIT- SYRINGES (ENGLISH)
1.0000 | Freq: Once | Status: AC
  Administered 2014-09-22: 1
  Filled 2014-09-22: qty 1

## 2014-09-22 MED ORDER — INSULIN PEN NEEDLE 32G X 5 MM MISC: 1.0000 [IU] | Freq: Every day | Status: DC

## 2014-09-22 MED ORDER — INSULIN ASPART 100 UNIT/ML ~~LOC~~ SOLN
0.0000 [IU] | Freq: Three times a day (TID) | SUBCUTANEOUS | Status: DC
  Administered 2014-09-22: 5 [IU] via SUBCUTANEOUS

## 2014-09-22 NOTE — Progress Notes (Signed)
  Date: 09/22/2014  Patient name: Dustin Hale  Medical record number: [id number]  Date of birth: [birth year]   This patient's plan of care was discussed with the house staff. Please see Dr. Talmadge Coventry note for complete details. I concur with her findings.  Patient will have outpatient follow up for his DM2.  He will need continued followed up for his DM and good education to help get the disease under control.  He can be discharged today.  Renal function likely stable (fluctuating around 1.0) while here.    Sid Falcon, MD 09/22/2014, 9:54 AM

## 2014-09-22 NOTE — Progress Notes (Addendum)
Discharge instructions gone over with patient. Home medications gone over. Prescriptions faxed to [facility] pharmacy. Patient to make follow up appointment in the morning. Diabetes videos all watched, starter kit given and gone over, as well as "living with Diabetes" booklet. Match letter also given and bottle of 70/30 insulin per physician order. Patient has return demonstrated how to draw up insulin and inject insulin. He stated how to safely dispose of syringes and properly store insulin. Stated signs and symptoms of hypoglycemia. Patient stated understanding of discharge instructions. 

## 2014-09-22 NOTE — Care Management Note (Signed)
    Page 1 of 1   09/22/2014     12:57:05 PM CARE MANAGEMENT NOTE 09/22/2014  Patient:  Dustin Hale,Dustin Hale   Account Number:  [id number]  Date Initiated:  09/22/2014  Documentation initiated by:  [facility]  Subjective/Objective Assessment:   adm: polyuria, polydipsia and fatigue     Action/Plan:   discharge planning   Anticipated DC Date:  09/22/2014   Anticipated DC Plan:  [place]  CM consult  [facility]      Choice offered to / List presented to:             Status of service:   Medicare Important Message given?   (If response is "NO", the following Medicare IM given date fields will be blank) Date Medicare IM given:   Medicare IM given by:   Date Additional Medicare IM given:   Additional Medicare IM given by:    Discharge Disposition:  HOME/SELF CARE  Per UR Regulation:    If discussed at Long Length of Stay Meetings, dates discussed:    Comments:  09/22/14 12:50 Cm met with pt in room and gave pt [place] letter with list of participating pharmacies.  Pt verbalized understanding of MATCH parameters and states he is going to [facility] to have prescriptions filled so he can pick up a ReLion Meter and strips.  CM also gave pt [place] pamphlet and pt verbalizes understanding he is going to clinic in the morning from 9-10:00 am and ask for: AN APPOINTMENT FOR A PRIMARY CARE PHYSICIAN; AN APPOINTMENT FOR A NAVIGATOR TO BEGIN THE INSURANCE PROCESS(MEDICAID);and AN APOINTMENT FOR FOLLOW UP MEDICAL CARE AND DIABETIC RESOURCES.  No other CM needs were communicated. Mariane Masters, BSN, CM [phone].

## 2014-09-22 NOTE — Progress Notes (Signed)
UR Completed.  [phone]  

## 2014-09-22 NOTE — Progress Notes (Signed)
Spoke with patient about new diabetes diagnosis. Discussed A1C results and explained what an A1C is, basic pathophysiology of DM Type 2, basic home care, importance of checking CBGs and maintaining good CBG control to prevent long-term and short-term complications. Reviewed signs and symptoms of hyperglycemia and hypoglycemia along with treatment for both.  Discussed impact of nutrition, exercise, stress, sickness, and medications on diabetes control.  Discussed carbohydrates, carbohydrate goals per day and meal, along with portion sizes. Asked patient to check his glucose at least 4 times a day (before meals and at bedtime and any time his glucose feels low or high) and to keep log of glucose values to take with him to follow up visits so adjustments can be made with his insulin regimen. Since patient does not have insurance or a PCP, advised patient to follow up with [facility] and [facility]. Informed patient he could purchase the Reli-On glucometer at [facility] for $15 and a box of 50 test strips for $9.  Also informed patient that Novolin 70/30 can be purchased at [facility] for $25 per vial (versus Novolog 70/30 which is over $150 per vial). Discussed insulin injection administration and patient states that he has already been giving himself insulin shots and feels comfortable with self injecting insulin. RNs to provide ongoing basic DM education at bedside with this patient and engage patient to actively check blood glucose and administer insulin injections. Have ordered educational booklet, insulin starter kit, RD consult, and DM videos. Patient verbalized understanding of information discussed and he states that he has no further questions at this time related to diabetes.   Patient appears very eager and motivated to learn about diabetes and how to improve glycemic control. Patient expressed appreciation of time and information provided.   Thanks, Barnie Alderman, RN, MSN, CCRN Diabetes  Coordinator Inpatient Diabetes Program [phone] (Team Pager) [phone] (AP office) [phone] [facility] office)

## 2014-09-22 NOTE — Progress Notes (Signed)
Patient demonstrates knowledge of drawing up insulin and used proper technique with injecting insulin. Educational material on diabetes given to patient. On call diabetes coordinator contacted for patient and materials from pharmacy ordered. Patient also has demonstrated proper technique in checking blood glucose level. Will continue to monitor patient.

## 2014-09-22 NOTE — Progress Notes (Signed)
Inpatient Diabetes Program Recommendations  AACE/ADA: New Consensus Statement on Inpatient Glycemic Control (2013)  Target Ranges:  Prepandial:   less than 140 mg/dL      Peak postprandial:   less than 180 mg/dL (1-2 hours)      Critically ill patients:  140 - 180 mg/dL  Results for NIKE, SOUTHWELL (MRN [id number]) as of 09/22/2014 08:11  Ref. Range 09/21/2014 07:36 09/21/2014 12:11 09/21/2014 15:54 09/21/2014 19:57 09/21/2014 23:57 09/22/2014 04:33 09/22/2014 07:52  Glucose-Capillary Latest Range: 70-99 mg/dL 232 (H) 342 (H) 347 (H) 245 (H) 174 (H) 197 (H) 186 (H)   Results for DELWIN, RACZKOWSKI (MRN [id number]) as of 09/22/2014 08:11  Ref. Range 09/20/2014 15:00  Hgb A1c MFr Bld Latest Range: <5.7 % 13.2 (H)   Diabetes history:None Outpatient Diabetes medications: NA Current orders for Inpatient glycemic control:Novolog 70/30 20 units BID, Novolog 0-15 units Q4H   Inpatient Diabetes Program Recommendations Insulin - Basal: Please consider increasing 70/30 to 23 units BID. Correction (SSI): Please consider changing frequency of correction to ACHS.  Note: Paged by RN regarding consult for diabetes education for newly diagnosed DM. Ordered living well with diabetes booklet, DM videos, RD consult, and insulin starter kit. Noted patient does not have any insurance nor a PCP. Patient will need close follow up since newly diagnosed and new to insulin. Plan to talk with patient once he receives booklet and insulin starter kit. Have asked RN to be sure he receives both once they come up from pharmacy.  In reviewing current glycemic trends recommend increasing 70/30 to 23 units BID and changing frequency of correction to ACHS.  Thanks, Barnie Alderman, RN, MSN, CCRN, CDE Diabetes Coordinator Inpatient Diabetes Program [phone] (Team Pager) [phone] ([place] office) [phone] [facility] office)

## 2014-09-22 NOTE — Progress Notes (Addendum)
Subjective:  No complaints today. Will like to go home today. Tolerating PO. Had not been seen by DM co-ordinator.   Objective: Vital signs in last 24 hours: Filed Vitals:   09/21/14 1639 09/21/14 2144 09/22/14 0217 09/22/14 0300  BP: 129/86 124/82 103/56 100/51  Pulse: 80 75 78 77  Temp: 98.7 F (37.1 C) 97.8 F (36.6 C) 98 F (36.7 C) 97.9 F (36.6 C)  TempSrc: Oral Oral Oral Oral  Resp: _0 Height: _1  (1.753 m)     Weight: 238 lb (107.956 kg)     SpO2: 99% 100% 100% 98%   Weight change: -2 lb (-0.907 kg)  Intake/Output Summary (Last 24 hours) at 09/22/14 0844 Last data filed at 09/21/14 1400  Gross per 24 hour  Intake   1050 ml  Output    600 ml  Net    450 ml   General: resting in bed, waiting for breakfast HEENT: PERRL, EOMI, no scleral icterus Cardiac: RRR, no rubs, murmurs or gallops Pulm: clear to auscultation bilaterally, no wheezes, rales, or rhonchi Abd: soft, nontender, nondistended, BS present Ext: warm and well perfused, no pedal edema, feet bilaterally with lesions underlying nails that appears consistent with onychomycosis  Neuro: responds to questions appropriately; moving all extremities freely  Lab Results: Basic Metabolic Panel:  Recent Labs Lab 09/20/14 2321 09/21/14 0321  NA 137 134*  K 3.0* 4.0  CL 109 105  CO2 21 26  GLUCOSE 137* 429*  BUN 6 5*  CREATININE 0.84 1.11  CALCIUM 8.1* 8.0*  MG  --  1.7   Liver Function Tests:  Recent Labs Lab 09/20/14 0830 09/21/14 0321  AST 19 22  ALT 20 19  ALKPHOS 244* 139*  BILITOT 1.2 0.8  PROT 7.1 5.3*  ALBUMIN 4.0 2.9*   CBC:  Recent Labs Lab 09/20/14 0830  WBC 7.3  HGB 14.8  HCT 42.8  MCV 88.2  PLT 150   CBG:  Recent Labs Lab 09/21/14 1211 09/21/14 1554 09/21/14 1957 09/21/14 2357 09/22/14 0433 09/22/14 0752  GLUCAP 342* 347* 245* 174* 197* 186*   Hemoglobin A1C:  Recent Labs Lab 09/20/14 1500  HGBA1C 13.2*   Urinalysis:  Recent Labs Lab  09/20/14 0824  COLORURINE YELLOW  LABSPEC 1.035*  PHURINE 5.0  GLUCOSEU >1000*  HGBUR TRACE*  BILIRUBINUR NEGATIVE  KETONESUR 40*  PROTEINUR NEGATIVE  UROBILINOGEN 0.2  NITRITE NEGATIVE  LEUKOCYTESUR NEGATIVE    Micro Results: Recent Results (from the past 240 hour(s))  MRSA PCR Screening     Status: None   Collection Time: 09/20/14  8:50 PM  Result Value Ref Range Status   MRSA by PCR NEGATIVE NEGATIVE Final    Comment:        The GeneXpert MRSA Assay (FDA approved for NASAL specimens only), is one component of a comprehensive MRSA colonization surveillance program. It is not intended to diagnose MRSA infection nor to guide or monitor treatment for MRSA infections.    Studies/Results: No results found. Medications: I have reviewed the patient's current medications. Scheduled Meds: . heparin  5,000 Units Subcutaneous 3 times per day  . insulin aspart  0-15 Units Subcutaneous 6 times per day  . insulin aspart protamine- aspart  20 Units Subcutaneous BID WC  . insulin starter kit- syringes  1 kit Other Once  . living well with diabetes book   Does not apply Once  . terbinafine  250 mg Oral Daily   Continuous Infusions:  PRN Meds:.acetaminophen **OR** acetaminophen, ondansetron **OR** ondansetron (ZOFRAN) IV Assessment/Plan:  DM2: DKA resolved with gap closed A1c 13.2 indicates poor control and will require insulin at discharge as he is a new diagnosis of DM2. Started on 70/30. -Start terbinafine 27m BID x 4 weeks for onychomycosis  -Change SSI-S->SSI-M and [facility] -Increased 70/30 to 20u BID -Awaiting for Diabetic Educator to see pt so pt can be discharged today. -Consult Case Manager to assist with [facility]. - Discharge home on metformin with 70/30  Renal insufficiency: Improved. Unsure of baseline.  -BMEt today pending  Elevated alkaline phosphatase: ALP 139 this AM,  down from 244. No physical exam findings suggestive of acute obstruction.  -Workup as outpatient   History of tobacco abuse: 16-pack year history of smoking though quit on 08/27/14.  #FEN:  -Diet: Carb Modified  #DVT prophylaxis: heparin 5000 units subcutaneous  #CODE STATUS: FULL CODE   Dispo: Disposition is deferred at this time, awaiting improvement of current medical problems.  Anticipated discharge in approximately 1 day.   The patient does not have a current PCP (No primary care provider on file.) and [facility].  The patient does not know have transportation limitations that hinder transportation to clinic appointments.   LOS: 2 days   EBethena Roys MD 09/22/2014, 8:44 AM

## 2014-09-30 ENCOUNTER — Encounter: Payer: Self-pay | Admitting: Internal Medicine

## 2014-09-30 ENCOUNTER — Ambulatory Visit: Payer: MEDICAID | Attending: Internal Medicine | Admitting: Internal Medicine

## 2014-09-30 VITALS — BP 130/88 | HR 80 | Temp 98.0°F | Resp 16 | Wt 243.0 lb

## 2014-09-30 DIAGNOSIS — R202 Paresthesia of skin: Secondary | ICD-10-CM

## 2014-09-30 DIAGNOSIS — E876 Hypokalemia: Secondary | ICD-10-CM

## 2014-09-30 DIAGNOSIS — E119 Type 2 diabetes mellitus without complications: Secondary | ICD-10-CM | POA: Insufficient documentation

## 2014-09-30 DIAGNOSIS — H538 Other visual disturbances: Secondary | ICD-10-CM

## 2014-09-30 DIAGNOSIS — B351 Tinea unguium: Secondary | ICD-10-CM

## 2014-09-30 DIAGNOSIS — E139 Other specified diabetes mellitus without complications: Secondary | ICD-10-CM

## 2014-09-30 DIAGNOSIS — R200 Anesthesia of skin: Secondary | ICD-10-CM

## 2014-09-30 DIAGNOSIS — L299 Pruritus, unspecified: Secondary | ICD-10-CM

## 2014-09-30 LAB — GLUCOSE, POCT (MANUAL RESULT ENTRY): POC Glucose: 109 mg/dl — AB (ref 70–99)

## 2014-09-30 LAB — COMPLETE METABOLIC PANEL WITH GFR
ALT: 44 U/L (ref 0–53)
AST: 24 U/L (ref 0–37)
Albumin: 4.1 g/dL (ref 3.5–5.2)
Alkaline Phosphatase: 140 U/L — ABNORMAL HIGH (ref 39–117)
BUN: 8 mg/dL (ref 6–23)
CO2: 24 meq/L (ref 19–32)
Calcium: 9.6 mg/dL (ref 8.4–10.5)
Chloride: 107 mEq/L (ref 96–112)
Creat: 0.99 mg/dL (ref 0.50–1.35)
GFR, Est Non African American: >89 mL/min
GLUCOSE: 116 mg/dL — AB (ref 70–99)
Potassium: 4 mEq/L (ref 3.5–5.3)
SODIUM: 143 meq/L (ref 135–145)
Total Bilirubin: 0.3 mg/dL (ref 0.2–1.2)
Total Protein: 6.9 g/dL (ref 6.0–8.3)

## 2014-09-30 LAB — TSH: TSH: 0.876 u[IU]/mL (ref 0.350–4.500)

## 2014-09-30 MED ORDER — GABAPENTIN 300 MG PO CAPS: 300.0000 mg | ORAL_CAPSULE | Freq: Every day | ORAL | Status: DC

## 2014-09-30 MED ORDER — CETIRIZINE HCL 10 MG PO TABS: 10.0000 mg | ORAL_TABLET | Freq: Every day | ORAL | Status: DC

## 2014-09-30 NOTE — Patient Instructions (Signed)
Diabetes Mellitus and Food It is important for you to manage your blood sugar (glucose) level. Your blood glucose level can be greatly affected by what you eat. Eating healthier foods in the appropriate amounts throughout the day at about the same time each day will help you control your blood glucose level. It can also help slow or prevent worsening of your diabetes mellitus. Healthy eating may even help you improve the level of your blood pressure and reach or maintain a healthy weight.  HOW CAN FOOD AFFECT ME? Carbohydrates Carbohydrates affect your blood glucose level more than any other type of food. Your dietitian will help you determine how many carbohydrates to eat at each meal and teach you how to count carbohydrates. Counting carbohydrates is important to keep your blood glucose at a healthy level, especially if you are using insulin or taking certain medicines for diabetes mellitus. Alcohol Alcohol can cause sudden decreases in blood glucose (hypoglycemia), especially if you use insulin or take certain medicines for diabetes mellitus. Hypoglycemia can be a life-threatening condition. Symptoms of hypoglycemia (sleepiness, dizziness, and disorientation) are similar to symptoms of having too much alcohol.  If your health care provider has given you approval to drink alcohol, do so in moderation and use the following guidelines:  Women should not have more than one drink per day, and men should not have more than two drinks per day. One drink is equal to:  12 oz of beer.  5 oz of wine.  1 oz of hard liquor.  Do not drink on an empty stomach.  Keep yourself hydrated. Have water, diet soda, or unsweetened iced tea.  Regular soda, juice, and other mixers might contain a lot of carbohydrates and should be counted. WHAT FOODS ARE NOT RECOMMENDED? As you make food choices, it is important to remember that all foods are not the same. Some foods have fewer nutrients per serving than other  foods, even though they might have the same number of calories or carbohydrates. It is difficult to get your body what it needs when you eat foods with fewer nutrients. Examples of foods that you should avoid that are high in calories and carbohydrates but low in nutrients include:  Trans fats (most processed foods list trans fats on the Nutrition Facts label).  Regular soda.  Juice.  Candy.  Sweets, such as cake, pie, doughnuts, and cookies.  Fried foods. WHAT FOODS CAN I EAT? Have nutrient-rich foods, which will nourish your body and keep you healthy. The food you should eat also will depend on several factors, including:  The calories you need.  The medicines you take.  Your weight.  Your blood glucose level.  Your blood pressure level.  Your cholesterol level. You also should eat a variety of foods, including:  Protein, such as meat, poultry, fish, tofu, nuts, and seeds (lean animal proteins are best).  Fruits.  Vegetables.  Dairy products, such as milk, cheese, and yogurt (low fat is best).  Breads, grains, pasta, cereal, rice, and beans.  Fats such as olive oil, trans fat-free margarine, canola oil, avocado, and olives. DOES EVERYONE WITH DIABETES MELLITUS HAVE THE SAME MEAL PLAN? Because every person with diabetes mellitus is different, there is not one meal plan that works for everyone. It is very important that you meet with a dietitian who will help you create a meal plan that is just right for you. Document Released: 05/20/2005 Document Revised: 08/28/2013 Document Reviewed: 07/20/2013 Exit[employer] Patient Information 2015 [employer]. This   information is not intended to replace advice given to you by your health care provider. Make sure you discuss any questions you have with your health care provider.  

## 2014-09-30 NOTE — Progress Notes (Signed)
Patient Demographics  Dustin Hale, is a 49 y.o. male  RXV:[id number]  [id number]:[id number]  DOB - [birth year]  CC:  Chief Complaint  Patient presents with  . Establish Care       HPI: Dustin Hale is a 49 y.o. male here today to establish medical care.History of tobacco use for several years, recently hospitalized with symptoms of increased urinary frequency past, EMR reviewed her patient was found to be in DKA, he was treated with IV insulin IV fluids, anion gap closed, subsequently was started on NovoLog 70/3020 units twice a day as well as on metformin, his hemoglobin A1c was greater than 13%, she also has onychomycosis and was started on Lamisil. As per patient he is taking his medications, he does report a blurry vision and as per patient he has started itching on his back pain down, denies any fever chills chest and shortness of breath, patient denies change in soap detergent but patient thinks it's probably the Lamisil when he started he noticed the symptoms, is not taking any allergy medications.patient also reported to have noticed some numbness tingling in his right arm but currently denies any. Patient has No headache, No chest pain, No abdominal pain - No Nausea, No new weakness tingling or numbness, No Cough - SOB.  No Known Allergies Past Medical History  Diagnosis Date  . Obesity   . Diabetes mellitus without complication    Current Outpatient Prescriptions on File Prior to Visit  Medication Sig Dispense Refill  . blood glucose meter kit and supplies KIT Dispense based on patient and insurance preference. Use up to four times daily as directed. (FOR ICD-9 250.00, 250.01). 1 each 2  . glucose blood test strip Use as instructed 100 each 12  . insulin NPH-regular Human (NOVOLIN 70/30 RELION) (70-30) 100 UNIT/ML injection Inject 20 Units into the skin 2 (two) times daily with a meal. 10 mL 2  . Insulin Pen Needle 32G X 5 MM MISC 1 Units by Does not apply route daily. 50 each 1   . Lancets Thin MISC 1 Units by Does not apply route daily. 50 each 1  . metFORMIN (GLUCOPHAGE) 500 MG tablet Take 1 tablet (500 mg total) by mouth 2 (two) times daily with a meal. 30 tablet 0  . terbinafine (LAMISIL) 250 MG tablet Take 1 tablet (250 mg total) by mouth daily. 42 tablet 0   No current facility-administered medications on file prior to visit.   Family History  Problem Relation Age of Onset  . Hypertension Mother   . Hypertension Father   . Hypertension Maternal Grandmother   . Hypertension Maternal Grandfather   . Hypertension Paternal Grandmother    History   Social History  . Marital Status: Single    Spouse Name: N/A    Number of Children: N/A  . Years of Education: N/A   Occupational History  . Not on file.   Social History Main Topics  . Smoking status: Former Smoker -- 0.50 packs/day for 32 years    Types: Cigarettes  . Smokeless tobacco: Never Used     Comment: quit smoking on 08/27/14  . Alcohol Use: No     Comment: Used to drink heavily, quit in 2014  . Drug Use: No     Comment: Former  . Sexual Activity: Not on file   Other Topics Concern  . Not on file   Social History Narrative    Review of Systems: Constitutional: Negative for fever, chills, diaphoresis,  activity change, appetite change and fatigue. HENT: Negative for ear pain, nosebleeds, congestion, facial swelling, rhinorrhea, neck pain, neck stiffness and ear discharge.  Eyes: Negative for pain, discharge, redness, itching and visual disturbance. Respiratory: Negative for cough, choking, chest tightness, shortness of breath, wheezing and stridor.  Cardiovascular: Negative for chest pain, palpitations and leg swelling. Gastrointestinal: Negative for abdominal distention. Genitourinary: Negative for dysuria, urgency, frequency, hematuria, flank pain, decreased urine volume, difficulty urinating and dyspareunia.  Musculoskeletal: Negative for back pain, joint swelling, arthralgia and  gait problem. Neurological: Negative for dizziness, tremors, seizures, syncope, facial asymmetry, speech difficulty, weakness, light-headedness, numbness and headaches.  Hematological: Negative for adenopathy. Does not bruise/bleed easily. Psychiatric/Behavioral: Negative for hallucinations, behavioral problems, confusion, dysphoric mood, decreased concentration and agitation.    Objective:   Filed Vitals:   09/30/14 1024  BP: 130/88  Pulse: 80  Temp: 98 F (36.7 C)  Resp: 16    Physical Exam: Constitutional: Patient appears well-developed and well-nourished. No distress. HENT: Normocephalic, atraumatic, External right and left ear normal. Oropharynx is clear and moist.  Eyes: Conjunctivae and EOM are normal. PERRLA, no scleral icterus. Neck: Normal ROM. Neck supple. No JVD. No tracheal deviation. No thyromegaly. CVS: RRR, S1/S2 +, no murmurs, no gallops, no carotid bruit.  Pulmonary: Effort and breath sounds normal, no stridor, rhonchi, wheezes, rales.  Abdominal: Soft. BS +, no distension, tenderness, rebound or guarding.  Musculoskeletal: Normal range of motion. No edema and no tenderness.  Lymphadenopathy: No lymphadenopathy noted, cervical, inguinal or axillary Neuro: Alert. Normal reflexes, muscle tone coordination. No cranial nerve deficit. Skin: Skin is warm and dry. No rash noted. Not diaphoretic. No erythema. No pallor.onychomycosis of bilateral feet big toe, no ulcers.2+ dorsalis pedis pulse Psychiatric: Normal mood and affect. Behavior, judgment, thought content normal.  Lab Results  Component Value Date   WBC 7.3 09/20/2014   HGB 14.8 09/20/2014   HCT 42.8 09/20/2014   MCV 88.2 09/20/2014   PLT 150 09/20/2014   Lab Results  Component Value Date   CREATININE 0.80 09/22/2014   BUN <5* 09/22/2014   NA 135 09/22/2014   K 3.3* 09/22/2014   CL 104 09/22/2014   CO2 25 09/22/2014    Lab Results  Component Value Date   HGBA1C 13.2* 09/20/2014   Lipid Panel    No results found for: CHOL, TRIG, HDL, CHOLHDL, VLDL, LDLCALC     Assessment and plan:   1. Other specified diabetes mellitus without complications Results for orders placed or performed in visit on 09/30/14  Glucose (CBG)  Result Value Ref Range   POC Glucose 109 (A) 70 - 99 mg/dl   Patient will continue with her current dose of insulin 20 units twice a day, metformin 500 mg twice a day, advise patient to keep the fingerstick log, will repeat A1c in 3 months. Ordered baseline blood work.  - Glucose (CBG) - COMPLETE METABOLIC PANEL WITH GFR - Vit D  25 hydroxy (rtn osteoporosis monitoring) - TSH - Ambulatory referral to Podiatry  2. Blurry vision  - Ambulatory referral to Ophthalmology  3. Numbness and tingling Trial of - gabapentin (NEURONTIN) 300 MG capsule; Take 1 capsule (300 mg total) by mouth at bedtime.  Dispense: 90 capsule; Refill: 3  4. Itching ?secondary to Lamisil, patient will try Zyrtec first if not improved, he will  discontinue the Lamisil medication. - cetirizine (ZYRTEC) 10 MG tablet; Take 1 tablet (10 mg total) by mouth daily.  Dispense: 30 tablet; Refill: 3  5. Onychomycosis  -  Ambulatory referral to Podiatry  6. Hypokalemia Will repeat - COMPLETE METABOLIC PANEL WITH GFR  Health Maintenance  -Vaccinations:  Offered but Patient will think about flu shot   Return in about 3 months (around 12/30/2014) for diabetes.  Lorayne Marek, MD

## 2014-09-30 NOTE — Progress Notes (Signed)
Patient here to establish  Care  Recently diagnosed with type 2 diabetes Complains of blurred vision Tingling in his right arm Right leg pain Complains of being itchy all over his torso

## 2014-10-01 LAB — VITAMIN D 25 HYDROXY (VIT D DEFICIENCY, FRACTURES): Vit D, 25-Hydroxy: 8 ng/mL — ABNORMAL LOW (ref 30–100)

## 2014-10-03 ENCOUNTER — Telehealth: Payer: Self-pay | Admitting: *Deleted

## 2014-10-03 MED ORDER — VITAMIN D (ERGOCALCIFEROL) 1.25 MG (50000 UNIT) PO CAPS: 50000.0000 [IU] | ORAL_CAPSULE | ORAL | Status: DC

## 2014-10-03 NOTE — Telephone Encounter (Signed)
-----   Message from Lorayne Marek, MD sent at 10/01/2014  9:23 AM EST ----- Blood work reviewed, noticed low vitamin D, call patient advise to start ergocalciferol 50,000 units once a week for the duration of  12 weeks. Let the patient know that his potassium level is in normal range.

## 2014-10-03 NOTE — Telephone Encounter (Signed)
Unable to contact pt, not a working number Rx was send to [facility]

## 2014-10-05 ENCOUNTER — Emergency Department (HOSPITAL_COMMUNITY)
Admission: EM | Admit: 2014-10-05 | Discharge: 2014-10-05 | Disposition: A | Discharge status: Home / Self Care | Payer: MEDICAID | Attending: Emergency Medicine | Admitting: Emergency Medicine

## 2014-10-05 ENCOUNTER — Encounter (HOSPITAL_COMMUNITY): Payer: Self-pay | Admitting: Cardiology

## 2014-10-05 DIAGNOSIS — Z79899 Other long term (current) drug therapy: Secondary | ICD-10-CM | POA: Insufficient documentation

## 2014-10-05 DIAGNOSIS — Z794 Long term (current) use of insulin: Secondary | ICD-10-CM | POA: Insufficient documentation

## 2014-10-05 DIAGNOSIS — H539 Unspecified visual disturbance: Secondary | ICD-10-CM | POA: Insufficient documentation

## 2014-10-05 DIAGNOSIS — E669 Obesity, unspecified: Secondary | ICD-10-CM | POA: Insufficient documentation

## 2014-10-05 DIAGNOSIS — H538 Other visual disturbances: Secondary | ICD-10-CM

## 2014-10-05 DIAGNOSIS — H5789 Other specified disorders of eye and adnexa: Secondary | ICD-10-CM

## 2014-10-05 DIAGNOSIS — Z87891 Personal history of nicotine dependence: Secondary | ICD-10-CM | POA: Insufficient documentation

## 2014-10-05 DIAGNOSIS — E119 Type 2 diabetes mellitus without complications: Secondary | ICD-10-CM | POA: Insufficient documentation

## 2014-10-05 LAB — CBG MONITORING, ED: Glucose-Capillary: 107 mg/dL — ABNORMAL HIGH (ref 70–99)

## 2014-10-05 MED ORDER — PROPARACAINE HCL 0.5 % OP SOLN
1.0000 [drp] | Freq: Once | OPHTHALMIC | Status: AC
  Administered 2014-10-05: 1 [drp] via OPHTHALMIC
  Filled 2014-10-05: qty 15

## 2014-10-05 MED ORDER — POLYMYXIN B-TRIMETHOPRIM 10000-0.1 UNIT/ML-% OP SOLN
1.0000 [drp] | Freq: Once | OPHTHALMIC | Status: AC
  Administered 2014-10-05: 1 [drp] via OPHTHALMIC
  Filled 2014-10-05: qty 10

## 2014-10-05 NOTE — ED Provider Notes (Signed)
CSN: [id number]     Arrival date & time 10/05/14  1115 History  This chart was scribed for Alecia Lemming, PA-C, working with Quintella Reichert, MD by Steva Colder, ED Scribe. The patient was seen in room TR09C/TR09C at 12:14 PM.    Chief Complaint  Patient presents with  . Eye Pain     The history is provided by the patient. No language interpreter was used.    HPI Comments: Dustin Hale is a 49 y.o. male who presents to the Emergency Department complaining of bilateral blurry vision and scant discharge onset 1 week. He reports that he can not see things far away like he normally would. He reports that his sugar this morning was 151 and he was recently dx with DM. He states that he is having associated symptoms of eye discharge, eye itching, watery eyes. He reports that he can not watch tv for a long period because he is squinting. He reports that he has had these symptoms since he was dx with DM.  He denies any other symptoms. He denies wearing glasses or contacts.   Past Medical History  Diagnosis Date  . Obesity   . Diabetes mellitus without complication    History reviewed. No pertinent past surgical history. Family History  Problem Relation Age of Onset  . Hypertension Mother   . Hypertension Father   . Hypertension Maternal Grandmother   . Hypertension Maternal Grandfather   . Hypertension Paternal Grandmother    History  Substance Use Topics  . Smoking status: Former Smoker -- 0.50 packs/day for 32 years    Types: Cigarettes  . Smokeless tobacco: Never Used     Comment: quit smoking on 08/27/14  . 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  HENT: Negative for rhinorrhea and sore throat.   Eyes: Positive for discharge, itching and visual disturbance. Negative for pain and redness.       Bilateral watery eyes  Respiratory: Negative for cough.   Cardiovascular: Negative for chest  pain.  Gastrointestinal: Negative for nausea, vomiting, abdominal pain and diarrhea.  Genitourinary: Negative for dysuria.  Musculoskeletal: Negative for myalgias.  Skin: Negative for rash.  Neurological: Negative for headaches.    Allergies  Review of patient's allergies indicates no known allergies.  Home Medications   Prior to Admission medications   Medication Sig Start Date End Date Taking? Authorizing Provider  blood glucose meter kit and supplies KIT Dispense based on patient and insurance preference. Use up to four times daily as directed. (FOR ICD-9 250.00, 250.01). 09/22/14   Ejiroghene E Denton Brick, MD  cetirizine (ZYRTEC) 10 MG tablet Take 1 tablet (10 mg total) by mouth daily. 09/30/14   Lorayne Marek, MD  gabapentin (NEURONTIN) 300 MG capsule Take 1 capsule (300 mg total) by mouth at bedtime. 09/30/14   Lorayne Marek, MD  glucose blood test strip Use as instructed 09/22/14   Ejiroghene E Denton Brick, MD  insulin NPH-regular Human (NOVOLIN 70/30 RELION) (70-30) 100 UNIT/ML injection Inject 20 Units into the skin 2 (two) times daily with a meal. 09/22/14   Ejiroghene E Emokpae, MD  Insulin Pen Needle 32G X 5 MM MISC 1 Units by Does not apply route daily. 09/22/14   Ejiroghene Arlyce Dice, MD  Lancets Thin MISC 1 Units by Does not apply route daily. 09/22/14   Ejiroghene Arlyce Dice, MD  metFORMIN (GLUCOPHAGE) 500 MG  tablet Take 1 tablet (500 mg total) by mouth 2 (two) times daily with a meal. 09/22/14   Ejiroghene E Emokpae, MD  terbinafine (LAMISIL) 250 MG tablet Take 1 tablet (250 mg total) by mouth daily. 09/22/14   Ejiroghene Arlyce Dice, MD  Vitamin D, Ergocalciferol, (DRISDOL) 50000 UNITS CAPS capsule Take 1 capsule (50,000 Units total) by mouth every 7 (seven) days. 10/03/14   Lorayne Marek, MD   BP 127/77 mmHg  Pulse 89  Temp(Src) 98.6 F (37 C)  Resp 16  Ht 5' 9"  (1.753 m)  Wt 260 lb (117.935 kg)  BMI 38.38 kg/m2  SpO2 94%  Physical Exam  Constitutional: He is oriented to person,  place, and time. He appears well-developed and well-nourished. No distress.  HENT:  Head: Normocephalic and atraumatic.  Mouth/Throat: Oropharynx is clear and moist.  Eyes: Conjunctivae and EOM are normal. Pupils are equal, round, and reactive to light. Right conjunctiva is not injected. Left conjunctiva is not injected. Right eye exhibits normal extraocular motion. Left eye exhibits normal extraocular motion.  Funduscopic exam limited due to no dilation and patient's compliance. No gross abnormality noted on this limited exam.   Neck: Normal range of motion. Neck supple. No tracheal deviation present.  Cardiovascular: Normal rate.   Pulmonary/Chest: Effort normal. No respiratory distress.  Musculoskeletal: Normal range of motion.  Neurological: He is alert and oriented to person, place, and time.  Skin: Skin is warm and dry.  Psychiatric: He has a normal mood and affect. His behavior is normal.  Nursing note and vitals reviewed.   ED Course  Procedures (including critical care time) DIAGNOSTIC STUDIES: Oxygen Saturation is 94% on room air, adequate by my interpretation.    COORDINATION OF CARE: 12:19 PM-Discussed treatment plan which includes referral to opthalmologist, abx eye drops with pt at bedside and pt agreed to plan.   Labs Review Labs Reviewed  CBG MONITORING, ED    Imaging Review No results found.   EKG Interpretation None      Two drops of proparacaine instilled into affected eye.   Tonometry performed. Right eye pressure: 20 Left eye pressure: 18  Patient tolerated procedure well without immediate complication.   D/c to home with polytrim drops and ophthalmology follow-up.    MDM   Final diagnoses:  Eye drainage  Blurry vision   Patient with diabetes, sugar controlled, with decreased visual acuity bilaterally but not vision loss and some scant discharge.  No foreign bodies noted, suspected. No surrounding erythema, swelling, vision changes/loss  suspicious for orbital or periorbital cellulitis. No signs of iritis. No signs of glaucoma, intraocular pressures normal. No symptoms of retinal detachment. No ophthalmologic emergency suspected. Outpatient referral given in case of no improvement.   I personally performed the services described in this documentation, which was scribed in my presence. The recorded information has been reviewed and is accurate.    Carlisle Cater, PA-C 10/05/14 1317  Quintella Reichert, MD 10/05/14 [phone]

## 2014-10-05 NOTE — Discharge Instructions (Signed)
Please read and follow all provided instructions.  Your diagnoses today include:  1. Eye drainage   2. Blurry vision     Tests performed today include:  Visual acuity testing to check your vision  Tonometry to check the pressure inside of your eye  Vital signs. See below for your results today.   Medications prescribed:   Polytrim (polymyxin B/trimethoprim) - antibiotic eye drops  Use this medication as follows:  Use 1 drop in affected eye every 3 hours while awake for 10 days. Do not exceed 6 doses in 24 hours.  Take any prescribed medications only as directed.  Home care instructions:  Follow any educational materials contained in this packet. If you wear contact lenses, do not use them until your eye caregiver approves. Follow-up care is necessary to be sure the infection is healing if not completely resolved in 2-3 days. See your caregiver or eye specialist as suggested for followup.   If you have an eye infection, wash your hands often as this is very contagious and is easily spread from person to person.   Follow-up instructions: Please follow-up with the opthalmologist listed in the next 2-3 days for further evaluation of your symptoms.  Return instructions:   Please return to the Emergency Department if you experience worsening symptoms.   Please return immediately if you develop severe pain, pus drainage, new change in vision, or fever.  Please return if you have any other emergent concerns.  Additional Information:  Your vital signs today were: BP 127/77 mmHg   Pulse 89   Temp(Src) 98.6 F (37 C)   Resp 16   Ht 5\' 9"  (1.753 m)   Wt 260 lb (117.935 kg)   BMI 38.38 kg/m2   SpO2 94% If your blood pressure (BP) was elevated above 135/85 this visit, please have this repeated by your doctor within one month. ---------------

## 2014-10-05 NOTE — ED Notes (Signed)
Reports that he has had burning to bilateral eyes for the past week. Denies any substance in his eyes. States he is having trouble reading his phone.

## 2014-10-05 NOTE — ED Notes (Signed)
Josh PA at bedside   

## 2014-10-08 ENCOUNTER — Telehealth: Payer: Self-pay | Admitting: *Deleted

## 2014-10-08 MED ORDER — METFORMIN HCL 500 MG PO TABS: 500.0000 mg | ORAL_TABLET | Freq: Two times a day (BID) | ORAL | Status: DC

## 2014-10-08 NOTE — Telephone Encounter (Signed)
Pt requesting Rx refills Metformin Refill was send to [facility]

## 2014-10-16 ENCOUNTER — Encounter (HOSPITAL_COMMUNITY): Payer: Self-pay | Admitting: Emergency Medicine

## 2014-10-16 ENCOUNTER — Emergency Department (HOSPITAL_COMMUNITY)
Admission: EM | Admit: 2014-10-16 | Discharge: 2014-10-16 | Disposition: A | Discharge status: Home / Self Care | Payer: Self-pay | Attending: Emergency Medicine | Admitting: Emergency Medicine

## 2014-10-16 DIAGNOSIS — Z79899 Other long term (current) drug therapy: Secondary | ICD-10-CM | POA: Insufficient documentation

## 2014-10-16 DIAGNOSIS — E119 Type 2 diabetes mellitus without complications: Secondary | ICD-10-CM | POA: Insufficient documentation

## 2014-10-16 DIAGNOSIS — G5621 Lesion of ulnar nerve, right upper limb: Secondary | ICD-10-CM

## 2014-10-16 DIAGNOSIS — E669 Obesity, unspecified: Secondary | ICD-10-CM | POA: Insufficient documentation

## 2014-10-16 DIAGNOSIS — Z794 Long term (current) use of insulin: Secondary | ICD-10-CM | POA: Insufficient documentation

## 2014-10-16 DIAGNOSIS — Z87891 Personal history of nicotine dependence: Secondary | ICD-10-CM | POA: Insufficient documentation

## 2014-10-16 DIAGNOSIS — G5601 Carpal tunnel syndrome, right upper limb: Secondary | ICD-10-CM | POA: Insufficient documentation

## 2014-10-16 MED ORDER — NAPROXEN 500 MG PO TABS: 500.0000 mg | ORAL_TABLET | Freq: Two times a day (BID) | ORAL | Status: DC

## 2014-10-16 NOTE — ED Provider Notes (Signed)
CSN: [id number]     Arrival date & time 10/16/14  1347 History   First MD Initiated Contact with Patient 10/16/14 1506     Chief Complaint  Patient presents with  . right arm numb      (Consider location/radiation/quality/duration/timing/severity/associated sxs/prior Treatment) HPI Dustin Hale is a 49 y.o. male with a history of diabetes comes in for evaluation of right finger numbness. Patient states on Sunday before the game started he was sitting in his recliner leaning with his arm on the armrest when his fourth and fifth fingers on his right hand started to tingle. He says he moved his arm around and sensation went away after about 20 minutes. He noticed it happened again today this morning around 9:00 when he was cooking in the kitchen. He felt that it was due to his diabetes so he took his medications, he reports the sensation left quickly as he took his medications. He denies discomfort now. He reports he is prescribed diabetes medications and "nerve pills" from his PCP, Dr. Annitta Needs. He reports he had a pinched nerve in his back one time and experienced symptoms similar to this. He denies any weakness, neck pain, back pain, loss of bowel or bladder, difficulties ambulating, chronic steroid use, IV drug use, vision changes. Denies any other associated symptoms. No other modifying factors. Reports his diabetes is well-controlled with typical blood sugars in the 130s.  Past Medical History  Diagnosis Date  . Obesity   . Diabetes mellitus without complication    History reviewed. No pertinent past surgical history. Family History  Problem Relation Age of Onset  . Hypertension Mother   . Hypertension Father   . Hypertension Maternal Grandmother   . Hypertension Maternal Grandfather   . Hypertension Paternal Grandmother    History  Substance Use Topics  . Smoking status: Former Smoker -- 0.50 packs/day for 32 years    Types: Cigarettes  . Smokeless tobacco: Never Used     Comment:  quit smoking on 08/27/14  . Alcohol Use: No     Comment: Used to drink heavily, quit in 2014    Review of Systems  All other systems reviewed and are negative.  A 10 point review of systems was completed and was negative except for pertinent positives and negatives as mentioned in the history of present illness     Allergies  Review of patient's allergies indicates no known allergies.  Home Medications   Prior to Admission medications   Medication Sig Start Date End Date Taking? Authorizing Provider  blood glucose meter kit and supplies KIT Dispense based on patient and insurance preference. Use up to four times daily as directed. (FOR ICD-9 250.00, 250.01). 09/22/14   Ejiroghene E Denton Brick, MD  cetirizine (ZYRTEC) 10 MG tablet Take 1 tablet (10 mg total) by mouth daily. 09/30/14   Lorayne Marek, MD  gabapentin (NEURONTIN) 300 MG capsule Take 1 capsule (300 mg total) by mouth at bedtime. 09/30/14   Lorayne Marek, MD  glucose blood test strip Use as instructed 09/22/14   Ejiroghene E Denton Brick, MD  insulin NPH-regular Human (NOVOLIN 70/30 RELION) (70-30) 100 UNIT/ML injection Inject 20 Units into the skin 2 (two) times daily with a meal. 09/22/14   Ejiroghene E Emokpae, MD  Insulin Pen Needle 32G X 5 MM MISC 1 Units by Does not apply route daily. 09/22/14   Ejiroghene Arlyce Dice, MD  Lancets Thin MISC 1 Units by Does not apply route daily. 09/22/14   Ejiroghene E Emokpae,  MD  metFORMIN (GLUCOPHAGE) 500 MG tablet Take 1 tablet (500 mg total) by mouth 2 (two) times daily with a meal. 10/08/14   Lorayne Marek, MD  naproxen (NAPROSYN) 500 MG tablet Take 1 tablet (500 mg total) by mouth 2 (two) times daily with a meal. 10/16/14   Viona Gilmore [place], PA-C  terbinafine (LAMISIL) 250 MG tablet Take 1 tablet (250 mg total) by mouth daily. 09/22/14   Ejiroghene Arlyce Dice, MD  Vitamin D, Ergocalciferol, (DRISDOL) 50000 UNITS CAPS capsule Take 1 capsule (50,000 Units total) by mouth every 7 (seven) days. 10/03/14    Lorayne Marek, MD   BP 138/96 mmHg  Pulse 87  Temp(Src) 98 F (36.7 C) (Oral)  Ht 5' 9" (1.753 m)  Wt 246 lb 14.4 oz (111.993 kg)  BMI 36.44 kg/m2  SpO2 99% Physical Exam  Constitutional: He is oriented to person, place, and time. He appears well-developed and well-nourished.  HENT:  Head: Normocephalic and atraumatic.  Mouth/Throat: Oropharynx is clear and moist.  Eyes: Conjunctivae and EOM are normal. Pupils are equal, round, and reactive to light. Right eye exhibits no discharge. Left eye exhibits no discharge. No scleral icterus.  Neck: Neck supple.  Cardiovascular: Normal rate, regular rhythm and normal heart sounds.   Pulmonary/Chest: Effort normal and breath sounds normal. No respiratory distress. He has no wheezes. He has no rales.  Abdominal: Soft. There is no tenderness.  Musculoskeletal: He exhibits no tenderness.  Paresthesias reproduced with palpation and tapping on cubital tunnel on the right elbow. Maintains full active range of motion of bilateral upper extremities, cervical, thoracic, lumbar spine.  Neurological: He is alert and oriented to person, place, and time.  Cranial Nerves II-XII grossly intact. Motor 5/5 in bilateral upper extremities. Grip strength intact and equal bilaterally. Patient is able to plantarflex and dorsiflex wrist without difficulty. Able to abduct and adduct fingers without difficulty. Sensation is slightly decreased over palmar aspect of fourth and fifth fingers right hand. Sensation is intact over the dorsal aspect. Gait is baseline without any apparent ataxia.  Skin: Skin is warm and dry. No rash noted.  Psychiatric: He has a normal mood and affect.  Nursing note and vitals reviewed.   ED Course  Procedures (including critical care time) Labs Review Labs Reviewed - No data to display  Imaging Review No results found.   EKG Interpretation None     Meds given in ED:  Medications - No data to display  New Prescriptions   NAPROXEN  (NAPROSYN) 500 MG TABLET    Take 1 tablet (500 mg total) by mouth 2 (two) times daily with a meal.   Filed Vitals:   10/16/14 1352  BP: 138/96  Pulse: 87  Temp: 98 F (36.7 C)  TempSrc: Oral  Height: 5' 9" (1.753 m)  Weight: 246 lb 14.4 oz (111.993 kg)  SpO2: 99%    MDM  Vitals stable - WNL -afebrile Pt resting comfortably in ED. PE--paresthesias reproduced with palpation of cubital tunnel on right arm consistent with cubital tunnel syndrome. No focal neurodeficits concerning for stroke.  DDX--Will DC with NSAIDs and referral to orthopedics for further evaluation and management of his symptoms. Low concern for any other acute or emergent pathology at this time.  I discussed all relevant lab findings and imaging results with pt and they verbalized understanding. Discussed f/u with PCP within 48 hrs and return precautions, pt very amenable to plan.  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

## 2014-10-16 NOTE — Discharge Instructions (Signed)
Cubital Tunnel Syndrome (Ulnar Neuritis) Cubital tunnel syndrome is a disorder of the nervous system of the elbow and upper arm the causes pain, tingling, weakness of the hand, or the loss of feeling in the ring and little fingers. The disorder is caused by a compression or stretching of the ulnar nerve at the elbow and in the forearm by muscles or ligament-like tissues. The ulnar nerve is susceptible to injury because it has little tissue that protects it at the elbow. The lack of protection increases the risk of injury. Cubital tunnel syndrome may decrease athletic performance in sports that require strong hand or wrist actions (tennis or racquetball).  SYMPTOMS   Clumsiness or weakness of the hand.  Poor dexterity (fine hand function).  Tenderness of the inner elbow.  Aching or soreness of the inner elbow.  Increased pain with forced full-elbow bending.  Reduced control with throwing, such as pitching.  Tingling, numbness, or burning inside the forearm or in part of the hand or fingers (especially the little finger or ring finger).  Sharp pains that shoot from the elbow down to the wrist and hand.  A weak grip, especially power grip, and a weak pinch.  Reduced performance in sports that require a strong grip. CAUSES   Increased pressure on the ulnar nerve at the elbow, arm, or forearm caused by swollen, inflamed, or scarred tissues; ligament-like; or between muscles.  Stretching of the nerve due to loose elbow ligaments.  Trauma to the nerve at the elbow.  Repetitive elbow bending. RISK INCREASES WITH:  Poor strength or flexibility.  Inadequate warm-up properly physical activity.  Diabetes mellitus.  under-active thyroid gland(hypothyroidism).  Repetitive and/or strenuous throwing motions such as baseball and javelin throwing.  Contact sports (football, soccer, rugby, or lacrosse).  Other elbow conditions (medial epicondylitis or loose inner elbow  ligaments). PREVENTION  Warm up and stretch properly before activity.  Maintain physical fitness:  Wrist, forearm, and elbow flexibility.  Muscle strength and endurance.  Cardiovascular fitness.  Wear proper protective equipment, including elbow pads.  Learn and use proper throwing techniques. PROGNOSIS  Cubital tunnel syndrome is typically curable is treated appropriately. Is some cases the condition may heal without treatment. If the muscle begins to waste or the nerve damage worsens, surgery may be necessary. RELATED COMPLICATIONS   Permanent numbness and weakness of the ring and little fingers.  Weak grip.  Permanent paralysis of some hand and finger muscles.  Risks associated with surgery, including infection, bleeding, injury to nerves (including the ulnar nerve), recurrent or continued symptoms, and elbow stiffness. TREATMENT  Treatment initially involves stopping the activities that cause the symptoms to worsen. Medications and ice can be used to reduce pain in inflammation. Splinting and protecting the elbow with padding (especially at night) to prevent full bending of the elbow may help. Stretching and strengthening exercises of the muscles of the forearm and elbow are important, and they may be performed at home or with the assistance of a therapist. If conservative treatment is not successful, surgery may be necessary to reduce compression of the nerve. Before return to sport, assessment for proper throwing and hitting mechanics is important.  MEDICATION   If pain medication is necessary, nonsteroidal anti-inflammatory medications, such as aspirin and ibuprofen, or other minor pain relievers, such as acetaminophen, are often recommended. Contact your caregiver immediately if any bleeding, stomach upset, or signs of an allergic reaction occur.  Prescription pain relievers are usually only prescribed after surgery. Use only as directed and  only as much as you need. COLD  THERAPY   Cold treatment (icing) relieves pain and reduces inflammation. Cold treatment should be applied for 10 to 15 minutes every 2 to 3 hours for inflammation and pain and immediately after any activity that aggravates your symptoms. Use ice packs or an ice massage. SEEK MEDICAL CARE IF:   Symptoms get worse or do not improve in 2 weeks despite treatment.  You experience pain, numbness, or coldness in the hand.  Blue, gray, or dark color appears in the fingernails.  Any of the following occur after surgery: increased pain, swelling, redness, drainage, or bleeding in the surgical area or signs of infection.  New, unexplained symptoms develop (drugs used in treatment may produce side effects). Document Released: 08/23/2005 Document Revised: 11/15/2011 Document Reviewed: 12/05/2008 [facility] Patient Information 2015 [place], [place]. This information is not intended to replace advice given to you by your health care provider. Make sure you discuss any questions you have with your health care provider.  That does not appear to be an emergent cause for your finger numbness at this time. It is important for you to release follow-up with your primary care provider for further evaluation and management of your symptoms. If your symptoms do not improve he may follow-up with orthopedics. Please take your naproxen as directed for any inflammation or discomfort he may experience. Return to ED for new or worsening symptoms.

## 2014-10-16 NOTE — ED Notes (Signed)
C/O RIGHT ARM,PALM OF HAND AND 4TH/5TH FINGERS WITH NUMBNESS SINCE THIS AM. DENIES PAIN. STATES HE HAS BEEN TOLD HE HAS "A PINCHED NERVE" IN HIS NECK. WAS RECOMMENDED TO HAVE SX.

## 2014-10-16 NOTE — ED Notes (Signed)
Pt had numbness and tingling in right arm, episode on Sunday, and again today-- currently only tip of fingers are numb-- this started approx 9am, states has been dx with "pinched nerve in neck before with same symptoms"

## 2014-10-24 ENCOUNTER — Telehealth: Payer: Self-pay | Admitting: Internal Medicine

## 2014-10-24 ENCOUNTER — Telehealth: Payer: Self-pay

## 2014-10-24 NOTE — Telephone Encounter (Signed)
Patient called to request to speak to doctor, patient states that he was not approved for disability. Patient states that he is on a medication that does not allow him to work. Please f/u with pt.

## 2014-10-24 NOTE — Telephone Encounter (Signed)
Pt Stated Disability been denied, was not sure the reason. Pt has Hx DM type II and is unable to read sometime due to blurry vision  Medication he is taking said no operating machines. Pt stated taking six more medication is not in our records. I explain to Pt PCP does not make the decision on Disability approval.

## 2014-10-24 NOTE — Telephone Encounter (Signed)
Patient called and told he was denied for disability  Patient feels with all the medications he takes he is unable to work Is there any documentation we can give him for his claim?

## 2014-10-29 ENCOUNTER — Ambulatory Visit: Payer: Self-pay | Admitting: Internal Medicine

## 2014-11-01 ENCOUNTER — Ambulatory Visit: Payer: Self-pay | Admitting: Podiatry

## 2014-11-12 ENCOUNTER — Encounter: Payer: Self-pay | Admitting: Internal Medicine

## 2014-11-12 ENCOUNTER — Ambulatory Visit: Payer: Self-pay | Attending: Internal Medicine | Admitting: Internal Medicine

## 2014-11-12 VITALS — BP 130/87 | HR 70 | Temp 98.0°F | Resp 16 | Wt 243.6 lb

## 2014-11-12 DIAGNOSIS — Z87891 Personal history of nicotine dependence: Secondary | ICD-10-CM | POA: Insufficient documentation

## 2014-11-12 DIAGNOSIS — Z794 Long term (current) use of insulin: Secondary | ICD-10-CM | POA: Insufficient documentation

## 2014-11-12 DIAGNOSIS — E119 Type 2 diabetes mellitus without complications: Secondary | ICD-10-CM | POA: Insufficient documentation

## 2014-11-12 DIAGNOSIS — Z791 Long term (current) use of non-steroidal anti-inflammatories (NSAID): Secondary | ICD-10-CM | POA: Insufficient documentation

## 2014-11-12 DIAGNOSIS — E559 Vitamin D deficiency, unspecified: Secondary | ICD-10-CM | POA: Insufficient documentation

## 2014-11-12 DIAGNOSIS — E139 Other specified diabetes mellitus without complications: Secondary | ICD-10-CM

## 2014-11-12 LAB — GLUCOSE, POCT (MANUAL RESULT ENTRY): POC Glucose: 91 mg/dl (ref 70–99)

## 2014-11-12 NOTE — Progress Notes (Signed)
MRN: [id number] Name: Dustin Hale  Sex: male Age: 49 y.o. DOB: 10/30/1965  Allergies: Review of patient's allergies indicates no known allergies.  Chief Complaint  Patient presents with  . Follow-up    HPI: Patient is 49 y.o. male who has history of diabetes mellitus currently taking metformin and NovoLog, he denies any hypoglycemic symptoms, patient had a blood work done which was reviewed with him, noticed vitamin D deficiency, today his major concern is his financial issues, denies any acute symptoms denies any headache dizziness chest and shortness of breath.  Past Medical History  Diagnosis Date  . Obesity   . Diabetes mellitus without complication     History reviewed. No pertinent past surgical history.    Medication List       This list is accurate as of: 11/12/14  5:34 PM.  Always use your most recent med list.               blood glucose meter kit and supplies Kit  Dispense based on patient and insurance preference. Use up to four times daily as directed. (FOR ICD-9 250.00, 250.01).     cetirizine 10 MG tablet  Commonly known as:  ZYRTEC  Take 1 tablet (10 mg total) by mouth daily.     gabapentin 300 MG capsule  Commonly known as:  NEURONTIN  Take 1 capsule (300 mg total) by mouth at bedtime.     glucose blood test strip  Use as instructed     insulin NPH-regular Human (70-30) 100 UNIT/ML injection  Commonly known as:  NOVOLIN 70/30 RELION  Inject 20 Units into the skin 2 (two) times daily with a meal.     Insulin Pen Needle 32G X 5 MM Misc  1 Units by Does not apply route daily.     Lancets Thin Misc  1 Units by Does not apply route daily.     metFORMIN 500 MG tablet  Commonly known as:  GLUCOPHAGE  Take 1 tablet (500 mg total) by mouth 2 (two) times daily with a meal.     naproxen 500 MG tablet  Commonly known as:  NAPROSYN  Take 1 tablet (500 mg total) by mouth 2 (two) times daily with a meal.     terbinafine 250 MG tablet  Commonly  known as:  LAMISIL  Take 1 tablet (250 mg total) by mouth daily.     Vitamin D (Ergocalciferol) 50000 UNITS Caps capsule  Commonly known as:  DRISDOL  Take 1 capsule (50,000 Units total) by mouth every 7 (seven) days.        No orders of the defined types were placed in this encounter.     There is no immunization history on file for this patient.  Family History  Problem Relation Age of Onset  . Hypertension Mother   . Hypertension Father   . Hypertension Maternal Grandmother   . Hypertension Maternal Grandfather   . Hypertension Paternal Grandmother     History  Substance Use Topics  . Smoking status: Former Smoker -- 0.50 packs/day for 32 years    Types: Cigarettes  . Smokeless tobacco: Never Used     Comment: quit smoking on 08/27/14  . 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  Eyes: EOM are normal. Pupils are equal, round, and reactive to light.  Cardiovascular: Normal rate and regular rhythm.   Pulmonary/Chest: Breath sounds normal. No respiratory distress. He has no wheezes. He has no rales.  Musculoskeletal: He exhibits no edema.    CBC    Component Value Date/Time   WBC 7.3 09/20/2014 0830   RBC 4.85 09/20/2014 0830   HGB 14.8 09/20/2014 0830   HCT 42.8 09/20/2014 0830   PLT 150 09/20/2014 0830   MCV 88.2 09/20/2014 0830    CMP     Component Value Date/Time   NA 143 09/30/2014 1109   K 4.0 09/30/2014 1109   CL 107 09/30/2014 1109   CO2 24 09/30/2014 1109   GLUCOSE 116* 09/30/2014 1109   BUN 8 09/30/2014 1109   CREATININE 0.99 09/30/2014 1109   CREATININE 0.80 09/22/2014 0921   CALCIUM 9.6 09/30/2014 1109   PROT 6.9 09/30/2014 1109   ALBUMIN 4.1 09/30/2014 1109   AST 24 09/30/2014 1109   ALT 44 09/30/2014 1109    ALKPHOS 140* 09/30/2014 1109   BILITOT 0.3 09/30/2014 1109   GFRNONAA >89 09/30/2014 1109   GFRNONAA >90 09/22/2014 0921   GFRAA >89 09/30/2014 1109   GFRAA >90 09/22/2014 0921    No results found for: CHOL  No components found for: HGA1C  Lab Results  Component Value Date/Time   AST 24 09/30/2014 11:09 AM    Assessment and Plan  Other specified diabetes mellitus without complications - Plan:  Results for orders placed or performed in visit on 11/12/14  Glucose (CBG)  Result Value Ref Range   POC Glucose 91 70 - 99 mg/dl    patient reported improvement in blood sugar levels, he will continue with metformin, insulin 20 units 2 times a day, keep the fingerstick log will repeat A1c in 3 months.  Vitamin D deficiency Patient has been prescribed vitamin D 50,000 units, patient has financial issues he will check with financial counselor also with her pharmacy to help with the medications.    Return in about 3 months (around 02/12/2015).   This note has been created with Surveyor, quantity. Any transcriptional errors are unintentional.    Lorayne Marek, MD

## 2014-11-12 NOTE — Progress Notes (Signed)
Patient here for follow up on his diabetes Patient has some issues he would like to discuss with doctor Has been turned down three times for disability

## 2014-11-27 ENCOUNTER — Telehealth: Payer: Self-pay | Admitting: Internal Medicine

## 2014-11-27 NOTE — Telephone Encounter (Signed)
Patient called to request his med refills for all of his current medications, please f/u with pt.

## 2014-12-06 ENCOUNTER — Ambulatory Visit: Payer: Self-pay

## 2014-12-09 ENCOUNTER — Telehealth: Payer: Self-pay | Admitting: Internal Medicine

## 2014-12-09 ENCOUNTER — Telehealth: Payer: Self-pay

## 2014-12-09 NOTE — Telephone Encounter (Signed)
Patient called stating he was cut off from food stamps due to new policy that took effect April 1  Patient is a diabetic and not working Asking for a letter  To state his medical conditioner to try to assist with him getting food stamps

## 2014-12-09 NOTE — Telephone Encounter (Signed)
Pt called requesting to speak to nurse, pt states he is needing a letter from the doctor stating that pt is diabetic and needs to get food stamps. Pt states he is not working and does not qualify to receive food stamps. Please f/u with pt

## 2014-12-11 ENCOUNTER — Other Ambulatory Visit: Payer: Self-pay

## 2014-12-12 LAB — HM DIABETES EYE EXAM

## 2014-12-22 ENCOUNTER — Emergency Department (HOSPITAL_COMMUNITY)
Admission: EM | Admit: 2014-12-22 | Discharge: 2014-12-22 | Disposition: A | Discharge status: Home / Self Care | Payer: Self-pay | Attending: Emergency Medicine | Admitting: Emergency Medicine

## 2014-12-22 ENCOUNTER — Emergency Department (HOSPITAL_COMMUNITY): Payer: Self-pay

## 2014-12-22 ENCOUNTER — Encounter (HOSPITAL_COMMUNITY): Payer: Self-pay

## 2014-12-22 DIAGNOSIS — Z87891 Personal history of nicotine dependence: Secondary | ICD-10-CM | POA: Insufficient documentation

## 2014-12-22 DIAGNOSIS — R0789 Other chest pain: Secondary | ICD-10-CM | POA: Insufficient documentation

## 2014-12-22 DIAGNOSIS — Z794 Long term (current) use of insulin: Secondary | ICD-10-CM | POA: Insufficient documentation

## 2014-12-22 DIAGNOSIS — E669 Obesity, unspecified: Secondary | ICD-10-CM | POA: Insufficient documentation

## 2014-12-22 DIAGNOSIS — E119 Type 2 diabetes mellitus without complications: Secondary | ICD-10-CM | POA: Insufficient documentation

## 2014-12-22 DIAGNOSIS — Z79899 Other long term (current) drug therapy: Secondary | ICD-10-CM | POA: Insufficient documentation

## 2014-12-22 DIAGNOSIS — Z791 Long term (current) use of non-steroidal anti-inflammatories (NSAID): Secondary | ICD-10-CM | POA: Insufficient documentation

## 2014-12-22 LAB — I-STAT TROPONIN, ED: Troponin i, poc: 0 ng/mL (ref 0.00–0.08)

## 2014-12-22 LAB — BASIC METABOLIC PANEL
ANION GAP: 10 (ref 5–15)
BUN: 11 mg/dL (ref 6–23)
CO2: 22 mmol/L (ref 19–32)
Calcium: 8.9 mg/dL (ref 8.4–10.5)
Chloride: 105 mmol/L (ref 96–112)
Creatinine, Ser: 0.95 mg/dL (ref 0.50–1.35)
GFR calc Af Amer: >90 mL/min (ref >90)
Glucose, Bld: 83 mg/dL (ref 70–99)
POTASSIUM: 3.7 mmol/L (ref 3.5–5.1)
Sodium: 137 mmol/L (ref 135–145)

## 2014-12-22 LAB — CBC
HEMATOCRIT: 42.1 % (ref 39.0–52.0)
Hemoglobin: 13.7 g/dL (ref 13.0–17.0)
MCH: 29.5 pg (ref 26.0–34.0)
MCHC: 32.5 g/dL (ref 30.0–36.0)
MCV: 90.5 fL (ref 78.0–100.0)
Platelets: 171 10*3/uL (ref 150–400)
RBC: 4.65 MIL/uL (ref 4.22–5.81)
RDW: 13.2 % (ref 11.5–15.5)
WBC: 5.9 10*3/uL (ref 4.0–10.5)

## 2014-12-22 MED ORDER — OXYCODONE-ACETAMINOPHEN 5-325 MG PO TABS
1.0000 | ORAL_TABLET | Freq: Once | ORAL | Status: AC
  Administered 2014-12-22: 1 via ORAL
  Filled 2014-12-22: qty 1

## 2014-12-22 NOTE — ED Notes (Signed)
Pt c/o right sided chest pain worsening on movement x 5 days. Denies injury, shortness of breath. Reports pain in constant.

## 2014-12-22 NOTE — ED Notes (Signed)
Onset 1 week constant right sided chest pain.  Pain has not increased.  No other s/s noted.

## 2014-12-22 NOTE — Discharge Instructions (Signed)
Return to the ED with any concerns including difficulty breathing, fainting, leg swelling, decreased level of alertness/lethargy, or any other alarming symptoms  You should continue taking naproxen as scheduled

## 2014-12-22 NOTE — ED Provider Notes (Signed)
CSN: [id number]     Arrival date & time 12/22/14  1253 History   First MD Initiated Contact with Patient 12/22/14 1546     Chief Complaint  Patient presents with  . Chest Pain     (Consider location/radiation/quality/duration/timing/severity/associated sxs/prior Treatment) HPI  Pt presenting with c/o chest pain over the past 1-2 weeks.  He states pain has been constant.  Pain is worse with movement of right shoulder, picking up his daughter.  Chest wall is sore to touch.  No fever/chills.  No cough.  No leg swelling.  No nausea, diaphoresis or radiation of pain.  No leg swelling.  No hx of DVT/PE no recent travel/trauma/surgery.  No shortness of breath associated with pain.  There are no other associated systemic symptoms, there are no other alleviating or modifying factors.   Past Medical History  Diagnosis Date  . Obesity   . Diabetes mellitus without complication    History reviewed. No pertinent past surgical history. Family History  Problem Relation Age of Onset  . Hypertension Mother   . Hypertension Father   . Hypertension Maternal Grandmother   . Hypertension Maternal Grandfather   . Hypertension Paternal Grandmother    History  Substance Use Topics  . Smoking status: Former Smoker -- 0.50 packs/day for 32 years    Types: Cigarettes  . Smokeless tobacco: Never Used     Comment: quit smoking on 08/27/14  . 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  Home Medications   Prior to Admission medications   Medication Sig Start Date End Date Taking? Authorizing Provider  blood glucose meter kit and supplies KIT Dispense based on patient and insurance preference. Use up to four times daily as directed. (FOR ICD-9 250.00, 250.01). 09/22/14   Ejiroghene E Denton Brick, MD  cetirizine (ZYRTEC)  10 MG tablet Take 1 tablet (10 mg total) by mouth daily. 09/30/14   Lorayne Marek, MD  gabapentin (NEURONTIN) 300 MG capsule Take 1 capsule (300 mg total) by mouth at bedtime. 09/30/14   Lorayne Marek, MD  glucose blood test strip Use as instructed 09/22/14   Ejiroghene E Denton Brick, MD  insulin NPH-regular Human (NOVOLIN 70/30 RELION) (70-30) 100 UNIT/ML injection Inject 20 Units into the skin 2 (two) times daily with a meal. 09/22/14   Ejiroghene E Emokpae, MD  Insulin Pen Needle 32G X 5 MM MISC 1 Units by Does not apply route daily. 09/22/14   Ejiroghene Arlyce Dice, MD  Lancets Thin MISC 1 Units by Does not apply route daily. 09/22/14   Ejiroghene Arlyce Dice, MD  metFORMIN (GLUCOPHAGE) 500 MG tablet Take 1 tablet (500 mg total) by mouth 2 (two) times daily with a meal. 10/08/14   Lorayne Marek, MD  naproxen (NAPROSYN) 500 MG tablet Take 1 tablet (500 mg total) by mouth 2 (two) times daily with a meal. 10/16/14   Comer Locket, PA-C  terbinafine (LAMISIL) 250 MG tablet Take 1 tablet (250 mg total) by mouth daily. 09/22/14   Ejiroghene Arlyce Dice, MD  Vitamin D, Ergocalciferol, (DRISDOL) 50000 UNITS CAPS capsule Take 1 capsule (50,000 Units total) by mouth every 7 (seven) days. 10/03/14   Lorayne Marek, MD   BP 120/77 mmHg  Pulse 72  Temp(Src) 98.1 F (36.7 C) (Oral)  Resp 16  Ht 5' 9"  (1.753 m)  Wt 239 lb 6.4 oz (108.591 kg)  BMI 35.34 kg/m2  SpO2 98%  Vitals reviewed Physical Exam  Physical Examination: General appearance - alert, well appearing, and in no distress Mental status - alert, oriented to person, place, and time Eyes - no conjunctival injection no scleral icterus Mouth - mucous membranes moist, pharynx normal without lesions Chest - clear to auscultation, no wheezes, rales or rhonchi, symmetric air entry, ttp over right pectoral distribution Heart - normal rate, regular rhythm, normal S1, S2, no murmurs, rubs, clicks or gallops Abdomen - soft, nontender, nondistended, no masses or  organomegaly Musculoskeletal - no joint tenderness, deformity or swelling, pain in right side of chest with ROM of right shoulder, no bony point tenderness around shoulder joint itself Extremities - peripheral pulses normal, no pedal edema, no clubbing or cyanosis Skin - normal coloration and turgor, no rashes  ED Course  Procedures (including critical care time) Labs Review Labs Reviewed  CBC  BASIC METABOLIC PANEL  Randolm Idol, ED    Imaging Review Dg Chest 2 View  12/22/2014   CLINICAL DATA:  Right side chest pain for 1 week.  EXAM: CHEST  2 VIEW  COMPARISON:  PA and lateral chest 04/30/2008.  FINDINGS: Lung volumes are somewhat lower than on the comparison study with crowding of the bronchovascular structures. The lungs are clear. Heart size is normal. No pneumothorax or pleural effusion.  IMPRESSION: No acute disease.   Electronically Signed   By: Inge Rise M.D.   On: 12/22/2014 14:15     EKG Interpretation   Date/Time:  Sunday December 22 2014 12:57:21 EDT Ventricular Rate:  79 PR Interval:  162 QRS Duration: 96 QT Interval:  382 QTC Calculation: 438 R Axis:   -24 Text Interpretation:  Normal sinus rhythm Normal ECG No old tracing to  compare Confirmed by [facility]  MD, Michaeal Davis [phone]) on 12/22/2014 4:06:56 PM      MDM   Final diagnoses:  Chest wall pain    Pt presenting with pain in right chest that is made worse by palpation movement of arm, lifting.  EKG, CXR and labs are all reassuring.  Doubt acs or PE,  Pain with more c/w musculoskeletal chest wall pain.   Xray images reviewed and interpreted by me as well.  Prior records reviewed and considered during this visit Nursing notes including past medical history and social history reviewed and considered in documentation Discharged with strict return precautions.  Pt agreeable with plan.     Alfonzo Beers, MD 12/24/14 [phone]

## 2014-12-25 ENCOUNTER — Ambulatory Visit: Payer: No Typology Code available for payment source | Admitting: Podiatry

## 2014-12-25 ENCOUNTER — Encounter: Payer: Self-pay | Admitting: Podiatry

## 2014-12-25 VITALS — BP 117/70 | HR 78 | Resp 18

## 2014-12-25 DIAGNOSIS — M79676 Pain in unspecified toe(s): Secondary | ICD-10-CM

## 2014-12-25 DIAGNOSIS — B351 Tinea unguium: Secondary | ICD-10-CM

## 2014-12-25 MED ORDER — CICLOPIROX 8 % EX SOLN: Freq: Every day | CUTANEOUS | Status: DC

## 2014-12-25 NOTE — Patient Instructions (Signed)
Onychomycosis/Fungal Toenails  WHAT IS IT? An infection that lies within the keratin of your nail plate that is caused by a fungus.  WHY ME? Fungal infections affect all ages, sexes, races, and creeds.  There may be many factors that predispose you to a fungal infection such as age, coexisting medical conditions such as diabetes, or an autoimmune disease; stress, medications, fatigue, genetics, etc.  Bottom line: fungus thrives in a warm, moist environment and your shoes offer such a location.  IS IT CONTAGIOUS? Theoretically, yes.  You do not want to share shoes, nail clippers or files with someone who has fungal toenails.  Walking around barefoot in the same room or sleeping in the same bed is unlikely to transfer the organism.  It is important to realize, however, that fungus can spread easily from one nail to the next on the same foot.  HOW DO WE TREAT THIS?  There are several ways to treat this condition.  Treatment may depend on many factors such as age, medications, pregnancy, liver and kidney conditions, etc.  It is best to ask your doctor which options are available to you.  1. No treatment.   Unlike many other medical concerns, you can live with this condition.  However for many people this can be a painful condition and may lead to ingrown toenails or a bacterial infection.  It is recommended that you keep the nails cut short to help reduce the amount of fungal nail. 2. Topical treatment.  These range from herbal remedies to prescription strength nail lacquers.  About 40-50% effective, topicals require twice daily application for approximately 9 to 12 months or until an entirely new nail has grown out.  The most effective topicals are medical grade medications available through physicians offices. 3. Oral antifungal medications.  With an 80-90% cure rate, the most common oral medication requires 3 to 4 months of therapy and stays in your system for a year as the new nail grows out.  Oral  antifungal medications do require blood work to make sure it is a safe drug for you.  A liver function panel will be performed prior to starting the medication and after the first month of treatment.  It is important to have the blood work performed to avoid any harmful side effects.  In general, this medication safe but blood work is required. 4. Laser Therapy.  This treatment is performed by applying a specialized laser to the affected nail plate.  This therapy is noninvasive, fast, and non-painful.  It is not covered by insurance and is therefore, out of pocket.  The results have been very good with a 80-95% cure rate.  [facility] is the only practice in the area to offer this therapy. Permanent Nail Avulsion.  Removing the entire nail so that a new nail will not grow back.Diabetes and Foot Care Diabetes may cause you to have problems because of poor blood supply (circulation) to your feet and legs. This may cause the skin on your feet to become thinner, break easier, and heal more slowly. Your skin may become dry, and the skin may peel and crack. You may also have nerve damage in your legs and feet causing decreased feeling in them. You may not notice minor injuries to your feet that could lead to infections or more serious problems. Taking care of your feet is one of the most important things you can do for yourself.  HOME CARE INSTRUCTIONS 5. Wear shoes at all times,  even in the house. Do not go barefoot. Bare feet are easily injured. 6. Check your feet daily for blisters, cuts, and redness. If you cannot see the bottom of your feet, use a mirror or ask someone for help. 7. Wash your feet with warm water (do not use hot water) and mild soap. Then pat your feet and the areas between your toes until they are completely dry. Do not soak your feet as this can dry your skin. 8. Apply a moisturizing lotion or petroleum jelly (that does not contain alcohol and is unscented) to the skin on your feet  and to dry, brittle toenails. Do not apply lotion between your toes. 9. Trim your toenails straight across. Do not dig under them or around the cuticle. File the edges of your nails with an emery board or nail file. 10. Do not cut corns or calluses or try to remove them with medicine. 11. Wear clean socks or stockings every day. Make sure they are not too tight. Do not wear knee-high stockings since they may decrease blood flow to your legs. 12. Wear shoes that fit properly and have enough cushioning. To break in new shoes, wear them for just a few hours a day. This prevents you from injuring your feet. Always look in your shoes before you put them on to be sure there are no objects inside. 13. Do not cross your legs. This may decrease the blood flow to your feet. 14. If you find a minor scrape, cut, or break in the skin on your feet, keep it and the skin around it clean and dry. These areas may be cleansed with mild soap and water. Do not cleanse the area with peroxide, alcohol, or iodine. 15. When you remove an adhesive bandage, be sure not to damage the skin around it. 16. If you have a wound, look at it several times a day to make sure it is healing. 17. Do not use heating pads or hot water bottles. They may burn your skin. If you have lost feeling in your feet or legs, you may not know it is happening until it is too late. 18. Make sure your health care provider performs a complete foot exam at least annually or more often if you have foot problems. Report any cuts, sores, or bruises to your health care provider immediately. SEEK MEDICAL CARE IF:   You have an injury that is not healing.  You have cuts or breaks in the skin.  You have an ingrown nail.  You notice redness on your legs or feet.  You feel burning or tingling in your legs or feet.  You have pain or cramps in your legs and feet.  Your legs or feet are numb.  Your feet always feel cold. SEEK IMMEDIATE MEDICAL CARE IF:    There is increasing redness, swelling, or pain in or around a wound.  There is a red line that goes up your leg.  Pus is coming from a wound.  You develop a fever or as directed by your health care provider.  You notice a bad smell coming from an ulcer or wound. Document Released: 08/20/2000 Document Revised: 04/25/2013 Document Reviewed: 01/30/2013 [facility] Patient Information 2015 [place], [place]. This information is not intended to replace advice given to you by your health care provider. Make sure you discuss any questions you have with your health care provider.

## 2014-12-25 NOTE — Progress Notes (Signed)
   Subjective:    Patient ID: Dustin Hale, male    DOB: Mar 24, 1966, 49 y.o.   MRN: [id number]  HPI I 49 year old male presents the office they with complaints of thick, painful, elongated toenails. He states he is previously on Lamisil however his primary care physician and stop taking the medication after 6 weeks. He states that he is unable to trim the toenails himself. States that the toenail to become painful with irritation in shoe gear. Denies any redness or drainage on the nail sites.   Review of Systems  All other systems reviewed and are negative.      Objective:   Physical Exam AAO x3, NAD DP/PT pulses palpable bilaterally, CRT less than 3 seconds Protective sensation intact with Simms Weinstein monofilament, vibratory sensation intact, Achilles tendon reflex intact Nails are hypertrophic, dystrophic, elongated, brittle, discolored particular bilateral hallux and second digit toenails. There is tenderness palpation overlying nails 1-5 bilaterally. There is no swelling erythema or drainage on the nail sites. No areas of tenderness to bilateral lower extremities. MMT 5/5, ROM WNL.  No open lesions or pre-ulcerative lesions.  No overlying edema, erythema, increase in warmth to bilateral lower extremities.  No pain with calf compression, swelling, warmth, erythema bilaterally.       Assessment & Plan:  49 year old male with symptomatic onychomycosis -Treatment options were discussed with the patient including all alternatives, risks, complications -Nail sharply debrided 10 without complications/bleeding -Prescribed Penlac. Application instructions and side effects the medication were discussed with the patient. -Discussed importance of daily foot inspection. Call the office with any changes. -Follow-up in 3 months or sooner if any problems are to arise. Call any questions or concerns or any change in symptoms.

## 2014-12-27 ENCOUNTER — Encounter: Payer: Self-pay | Admitting: Podiatry

## 2014-12-30 ENCOUNTER — Ambulatory Visit: Payer: Self-pay | Admitting: Internal Medicine

## 2015-01-27 ENCOUNTER — Encounter: Payer: Self-pay | Admitting: Internal Medicine

## 2015-03-11 ENCOUNTER — Telehealth: Payer: Self-pay

## 2015-03-11 NOTE — Telephone Encounter (Signed)
Returned patient phone call Patient needed to schedule an appointment with Tye Maryland in pharmacy for PASS program Call transferred to pharmacy

## 2015-03-17 ENCOUNTER — Ambulatory Visit: Payer: Self-pay

## 2015-03-19 ENCOUNTER — Other Ambulatory Visit: Payer: Self-pay

## 2015-03-19 MED ORDER — INSULIN NPH ISOPHANE & REGULAR (70-30) 100 UNIT/ML ~~LOC~~ SUSP: 20.0000 [IU] | Freq: Two times a day (BID) | SUBCUTANEOUS | Status: DC

## 2015-03-25 ENCOUNTER — Ambulatory Visit: Payer: No Typology Code available for payment source | Admitting: Podiatry

## 2015-03-25 ENCOUNTER — Encounter: Payer: Self-pay | Admitting: Podiatry

## 2015-03-25 VITALS — BP 116/75 | HR 69 | Resp 15 | Ht 73.0 in | Wt 235.0 lb

## 2015-03-25 DIAGNOSIS — B351 Tinea unguium: Secondary | ICD-10-CM

## 2015-03-25 DIAGNOSIS — E119 Type 2 diabetes mellitus without complications: Secondary | ICD-10-CM

## 2015-03-25 NOTE — Progress Notes (Signed)
Subjective:     Patient ID: Dustin Hale, male   DOB: 1965-12-16, 49 y.o.   MRN: [id number]  HPIThius patient presents with discoloration on end of her big toes both feet.He says there is fungus and he wants to proceed with treatment.  He previously was treated with lamisil which was previously prescribed and stopped in mid treatment. He denies any pain in the nails. He was seen by dr. Jacqualyn Posey with penlac.     Review of Systems     Objective:   Physical Exam GENERAL APPEARANCE: Alert, conversant. Appropriately groomed. No acute distress.  VASCULAR: Pedal pulses palpable at 2/4 DP and PT bilateral.  Capillary refill time is immediate to all digits,  Proximal to distal cooling it warm to warm.  Digital hair growth is present bilateral  NEUROLOGIC: sensation is intact epicritically and protectively to 5.07 monofilament at 5/5 sites bilateral.  Light touch is intact bilateral, vibratory sensation intact bilateral, achilles tendon reflex is intact bilateral.  MUSCULOSKELETAL: acceptable muscle strength, tone and stability bilateral.  Intrinsic muscluature intact bilateral.  Rectus appearance of foot and digits noted bilateral.   DERMATOLOGIC: skin color, texture, and turgor are within normal limits.  No preulcerative lesions are seen, no interdigital maceration noted.  No open lesions present.   No drainage noted. NAILS  Thick disfigured discolored hallux nails both feet.       Assessment:     Onychomycosis Hallux B/L     Plan:     IE  Sample put onto DTM plate. I wonder why lamisil was discontinued.  He also never included penlac in his history.

## 2015-04-04 ENCOUNTER — Ambulatory Visit: Payer: No Typology Code available for payment source

## 2015-04-16 ENCOUNTER — Telehealth: Payer: Self-pay | Admitting: Podiatry

## 2015-04-16 NOTE — Telephone Encounter (Signed)
Pt requesting lab results from Dr. Prudence Davidson.

## 2015-04-16 NOTE — Telephone Encounter (Signed)
PT LVM ASKING FOR A CALL BACK REGUARDING LAB RESULTS.

## 2015-04-22 ENCOUNTER — Ambulatory Visit: Payer: No Typology Code available for payment source | Admitting: Podiatry

## 2015-04-25 ENCOUNTER — Ambulatory Visit (INDEPENDENT_AMBULATORY_CARE_PROVIDER_SITE_OTHER): Payer: No Typology Code available for payment source | Admitting: Podiatry

## 2015-04-25 ENCOUNTER — Encounter: Payer: Self-pay | Admitting: Podiatry

## 2015-04-25 VITALS — BP 138/73 | HR 71 | Resp 18

## 2015-04-25 DIAGNOSIS — B351 Tinea unguium: Secondary | ICD-10-CM

## 2015-04-25 DIAGNOSIS — L603 Nail dystrophy: Secondary | ICD-10-CM

## 2015-04-26 NOTE — Progress Notes (Signed)
Subjective:     Patient ID: Dustin Hale, male   DOB: 1966-05-24, 49 y.o.   MRN: [id number]  HPIThis patient presents for continued evaluation of his nails.  His nails have grown thick and long.  He desires to be evaluated for possible fungal infection to nails.   Review of Systems     Objective:   Physical Exam GENERAL APPEARANCE: Alert, conversant. Appropriately groomed. No acute distress.  VASCULAR: Pedal pulses palpable at  [facility] and PT bilateral.  Capillary refill time is immediate to all digits,  Normal temperature gradient.  Digital hair growth is present bilateral  NEUROLOGIC: sensation is normal to 5.07 monofilament at 5/5 sites bilateral.  Light touch is intact bilateral, Muscle strength normal.  MUSCULOSKELETAL: acceptable muscle strength, tone and stability bilateral.  Intrinsic muscluature intact bilateral.  Rectus appearance of foot and digits noted bilateral.   DERMATOLOGIC: skin color, texture, and turgor are within normal limits.  No preulcerative lesions or ulcers  are seen, no interdigital maceration noted.  No open lesions present.  Digital nails are asymptomatic. No drainage noted. His hallux nails are thick and disfigured at distal aspect nail plate hallux B/L      Assessment:     Onychomycosis     Plan:     Nail sample taken and sent to [facility].

## 2015-04-28 ENCOUNTER — Encounter (HOSPITAL_COMMUNITY): Payer: Self-pay | Admitting: Emergency Medicine

## 2015-04-28 ENCOUNTER — Emergency Department (HOSPITAL_COMMUNITY)
Admission: EM | Admit: 2015-04-28 | Discharge: 2015-04-28 | Disposition: A | Discharge status: Home / Self Care | Payer: Self-pay | Attending: Emergency Medicine | Admitting: Emergency Medicine

## 2015-04-28 DIAGNOSIS — Z87891 Personal history of nicotine dependence: Secondary | ICD-10-CM | POA: Insufficient documentation

## 2015-04-28 DIAGNOSIS — E669 Obesity, unspecified: Secondary | ICD-10-CM | POA: Insufficient documentation

## 2015-04-28 DIAGNOSIS — E119 Type 2 diabetes mellitus without complications: Secondary | ICD-10-CM | POA: Insufficient documentation

## 2015-04-28 DIAGNOSIS — M25512 Pain in left shoulder: Secondary | ICD-10-CM | POA: Insufficient documentation

## 2015-04-28 DIAGNOSIS — Z794 Long term (current) use of insulin: Secondary | ICD-10-CM | POA: Insufficient documentation

## 2015-04-28 DIAGNOSIS — Z79899 Other long term (current) drug therapy: Secondary | ICD-10-CM | POA: Insufficient documentation

## 2015-04-28 MED ORDER — NAPROXEN 500 MG PO TABS: 500.0000 mg | ORAL_TABLET | Freq: Two times a day (BID) | ORAL | Status: DC

## 2015-04-28 NOTE — Discharge Instructions (Signed)
You were evaluated in the ED for your left shoulder pain. There does not appear to be an emergent cause her symptoms at this time. Please take your medications as prescribed. Do not take this medication in combination with Advil, Motrin, ibuprofen. Follow up with your primary care doctor as needed for reevaluation. Return to ED for worsening symptoms.  Arthralgia Your caregiver has diagnosed you as suffering from an arthralgia. Arthralgia means there is pain in a joint. This can come from many reasons including:  Bruising the joint which causes soreness (inflammation) in the joint.  Wear and tear on the joints which occur as we grow older (osteoarthritis).  Overusing the joint.  Various forms of arthritis.  Infections of the joint. Regardless of the cause of pain in your joint, most of these different pains respond to anti-inflammatory drugs and rest. The exception to this is when a joint is infected, and these cases are treated with antibiotics, if it is a bacterial infection. HOME CARE INSTRUCTIONS   Rest the injured area for as long as directed by your caregiver. Then slowly start using the joint as directed by your caregiver and as the pain allows. Crutches as directed may be useful if the ankles, knees or hips are involved. If the knee was splinted or casted, continue use and care as directed. If an stretchy or elastic wrapping bandage has been applied today, it should be removed and re-applied every 3 to 4 hours. It should not be applied tightly, but firmly enough to keep swelling down. Watch toes and feet for swelling, bluish discoloration, coldness, numbness or excessive pain. If any of these problems (symptoms) occur, remove the ace bandage and re-apply more loosely. If these symptoms persist, contact your caregiver or return to this location.  For the first 24 hours, keep the injured extremity elevated on pillows while lying down.  Apply ice for 15-20 minutes to the sore joint every  couple hours while awake for the first half day. Then 03-04 times per day for the first 48 hours. Put the ice in a plastic bag and place a towel between the bag of ice and your skin.  Wear any splinting, casting, elastic bandage applications, or slings as instructed.  Only take over-the-counter or prescription medicines for pain, discomfort, or fever as directed by your caregiver. Do not use aspirin immediately after the injury unless instructed by your physician. Aspirin can cause increased bleeding and bruising of the tissues.  If you were given crutches, continue to use them as instructed and do not resume weight bearing on the sore joint until instructed. Persistent pain and inability to use the sore joint as directed for more than 2 to 3 days are warning signs indicating that you should see a caregiver for a follow-up visit as soon as possible. Initially, a hairline fracture (break in bone) may not be evident on X-rays. Persistent pain and swelling indicate that further evaluation, non-weight bearing or use of the joint (use of crutches or slings as instructed), or further X-rays are indicated. X-rays may sometimes not show a small fracture until a week or 10 days later. Make a follow-up appointment with your own caregiver or one to whom we have referred you. A radiologist (specialist in reading X-rays) may read your X-rays. Make sure you know how you are to obtain your X-ray results. Do not assume everything is normal if you do not hear from [place]. SEEK MEDICAL CARE IF: Bruising, swelling, or pain increases. [place]  IF:   Your fingers or toes are numb or blue.  The pain is not responding to medications and continues to stay the same or get worse.  The pain in your joint becomes severe.  You develop a fever over 102 F (38.9 C).  It becomes impossible to move or use the joint. MAKE SURE YOU:   Understand these instructions.  Will watch your condition.  Will get help  right away if you are not doing well or get worse. Document Released: 08/23/2005 Document Revised: 11/15/2011 Document Reviewed: 04/10/2008 [facility] Patient Information 2015 [place], [place]. This information is not intended to replace advice given to you by your health care provider. Make sure you discuss any questions you have with your health care provider.

## 2015-04-28 NOTE — ED Notes (Signed)
Declined W/C at D/C and was escorted to lobby by RN. 

## 2015-04-28 NOTE — ED Notes (Addendum)
Pt has L shoulder pain that started 2 mos ago; has full ROM but painful; Nothing seems to make it better or worse except using it to lift things. Cannot think of any injury. R shoulder fine and no other complaints.

## 2015-04-28 NOTE — ED Notes (Signed)
PT refuses to remove shirt to place gown on for physical exam of painful LT shoulder.

## 2015-04-28 NOTE — ED Provider Notes (Signed)
CSN: [id number]     Arrival date & time 04/28/15  0850 History   This chart was scribed for non-physician practitioner, Comer Locket, PA-C, working with Forde Dandy, MD by Ladene Artist, ED Scribe. This patient was seen in room TR06C/TR06C and the patient's care was started at 10:17 AM.   Chief Complaint  Patient presents with  . Shoulder Pain   The history is provided by the patient. No language interpreter was used.   HPI Comments: Dustin Hale is a 49 y.o. male, with a h/o DM,  who presents to the Emergency Department complaining of persistent left shoulder pain for the past 2 months. Pt denies injury. He reports constant sharp pain that is exacerbated with movement. He states that he has discussed symptoms with his PCP Lorayne Marek, MD previously who suspected neuropathy. Pt denies redness, swelling or warmth. No h/o gout. No h/o abdominal or kidney complications.   Past Medical History  Diagnosis Date  . Obesity   . Diabetes mellitus without complication    History reviewed. No pertinent past surgical history. Family History  Problem Relation Age of Onset  . Hypertension Mother   . Hypertension Father   . Hypertension Maternal Grandmother   . Hypertension Maternal Grandfather   . Hypertension Paternal Grandmother    Social History  Substance Use Topics  . Smoking status: Former Smoker -- 0.50 packs/day for 32 years    Types: Cigarettes  . Smokeless tobacco: Never Used     Comment: quit smoking on 08/27/14  . 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 Negative for joint swelling.  Skin: Negative for color change.  All other systems reviewed and are negative.  Allergies  Review of patient's allergies indicates no known allergies.  Home Medications   Prior to Admission medications   Medication Sig Start Date End Date Taking? Authorizing Provider  blood glucose meter kit and  supplies KIT Dispense based on patient and insurance preference. Use up to four times daily as directed. (FOR ICD-9 250.00, 250.01). 09/22/14   Ejiroghene E Denton Brick, MD  cetirizine (ZYRTEC) 10 MG tablet Take 1 tablet (10 mg total) by mouth daily. 09/30/14   Lorayne Marek, MD  ciclopirox (PENLAC) 8 % solution Apply topically at bedtime. Apply over nail and surrounding skin. Apply daily over previous coat. After seven (7) days, may remove with alcohol and continue cycle. 12/25/14   Trula Slade, DPM  gabapentin (NEURONTIN) 300 MG capsule Take 1 capsule (300 mg total) by mouth at bedtime. 09/30/14   Lorayne Marek, MD  glucose blood test strip Use as instructed 09/22/14   Ejiroghene E Denton Brick, MD  insulin NPH-regular Human (NOVOLIN 70/30 RELION) (70-30) 100 UNIT/ML injection Inject 20 Units into the skin 2 (two) times daily with a meal. 03/19/15   Lorayne Marek, MD  Insulin Pen Needle 32G X 5 MM MISC 1 Units by Does not apply route daily. 09/22/14   Ejiroghene Arlyce Dice, MD  Lancets Thin MISC 1 Units by Does not apply route daily. 09/22/14   Ejiroghene Arlyce Dice, MD  metFORMIN (GLUCOPHAGE) 500 MG tablet Take 1 tablet (500 mg total) by mouth 2 (two) times daily with a meal. 10/08/14   Lorayne Marek, MD  naproxen (NAPROSYN) 500 MG tablet Take 1 tablet (500 mg total) by mouth 2 (two) times daily. 04/28/15   Comer Locket, PA-C  terbinafine (LAMISIL) 250 MG tablet  Take 1 tablet (250 mg total) by mouth daily. 09/22/14   Ejiroghene Arlyce Dice, MD  Vitamin D, Ergocalciferol, (DRISDOL) 50000 UNITS CAPS capsule Take 1 capsule (50,000 Units total) by mouth every 7 (seven) days. 10/03/14   Lorayne Marek, MD   BP 123/80 mmHg  Pulse 79  Temp(Src) 98.8 F (37.1 C) (Oral)  Resp 17  Ht 6' 1"  (1.854 m)  Wt 206 lb (93.441 kg)  BMI 27.18 kg/m2  SpO2 100% Physical Exam  Constitutional: He is oriented to person, place, and time. He appears well-developed and well-nourished. No distress.  HENT:  Head: Normocephalic and  atraumatic.  Eyes: Conjunctivae and EOM are normal.  Neck: Neck supple. No tracheal deviation present.  Cardiovascular: Normal rate and intact distal pulses.   Pulmonary/Chest: Effort normal. No respiratory distress.  Musculoskeletal: Normal range of motion.  L shoulder: full active ROM with some discomfort. No bony tenderness. No swelling, warmth, effusion or erythema. Distal pulses intact. No other lesions or deformity noted to L shoulder. No clavicular or scapular tenderness.   Neurological: He is alert and oriented to person, place, and time.  Skin: Skin is warm and dry.  Psychiatric: He has a normal mood and affect. His behavior is normal.  Nursing note and vitals reviewed.  ED Course  Procedures (including critical care time) DIAGNOSTIC STUDIES: Oxygen Saturation is 100% on RA, normal by my interpretation.    COORDINATION OF CARE: 10:21 AM-Discussed treatment plan with pt at bedside and pt agreed to plan.   Labs Review Labs Reviewed - No data to display  Imaging Review No results found. I have personally reviewed and evaluated these images and lab results as part of my medical decision-making.   EKG Interpretation None     Meds given in ED:  Medications - No data to display  Discharge Medication List as of 04/28/2015 10:55 AM     Filed Vitals:   04/28/15 0910 04/28/15 1114  BP: 123/80 118/90  Pulse: 79 90  Temp: 98.8 F (37.1 C) 98.6 F (37 C)  TempSrc: Oral Oral  Resp: 17 16  Height: 6' 1"  (1.854 m)   Weight: 206 lb (93.441 kg)   SpO2: 100% 98%   Meds given in ED:  Medications - No data to display  Discharge Medication List as of 04/28/2015 10:55 AM     Filed Vitals:   04/28/15 0910 04/28/15 1114  BP: 123/80 118/90  Pulse: 79 90  Temp: 98.8 F (37.1 C) 98.6 F (37 C)  TempSrc: Oral Oral  Resp: 17 16  Height: 6' 1"  (1.854 m)   Weight: 206 lb (93.441 kg)   SpO2: 100% 98%    MDM  Vitals stable - WNL -afebrile Pt resting comfortably in  ED. PE--physical exam as above. Normal neuro exam. Suspect likely musculoskeletal source  DDX--patient here for evaluation of shoulder pain for the past 2 months. Denies any mechanism of injury. Suspect likely musculoskeletal injury. No evidence of septic joint, hemarthrosis. No erythema or other evidence of infection. No bony tenderness. Maintains full active range of motion with some discomfort. Will DC with anti-inflammatory's and instruction to follow-up with PCP. No evidence of other acute or emergent pathology at this time I discussed all relevant lab findings and imaging results with pt and they verbalized understanding. Discussed f/u with PCP within 48 hrs and return precautions, pt very amenable to plan.  Final diagnoses:  Left shoulder pain    I personally performed the services described in this documentation, which  was scribed in my presence. The recorded information has been reviewed and is accurate.   Comer Locket, PA-C 04/28/15 [place]or[place] Liu, MD 04/28/15 [phone]

## 2015-04-29 NOTE — Telephone Encounter (Signed)
Patient came back in the office([place]) and Dr Prudence Davidson took another sample to sent to [facility]. Lattie Haw

## 2015-04-29 NOTE — Addendum Note (Signed)
Addended by: Ronald Lobo on: 04/29/2015 08:29 AM   Modules accepted: Orders

## 2015-05-14 ENCOUNTER — Ambulatory Visit: Payer: Self-pay | Attending: Internal Medicine

## 2015-05-22 ENCOUNTER — Telehealth: Payer: Self-pay | Admitting: *Deleted

## 2015-05-22 ENCOUNTER — Ambulatory Visit: Payer: No Typology Code available for payment source | Admitting: Podiatry

## 2015-05-22 ENCOUNTER — Ambulatory Visit: Payer: Self-pay | Admitting: Family Medicine

## 2015-05-29 NOTE — Telephone Encounter (Signed)
Error opened

## 2015-06-19 ENCOUNTER — Other Ambulatory Visit: Payer: Self-pay | Admitting: Internal Medicine

## 2015-06-20 ENCOUNTER — Ambulatory Visit: Payer: Self-pay | Attending: Family Medicine | Admitting: Family Medicine

## 2015-06-20 ENCOUNTER — Encounter: Payer: Self-pay | Admitting: Family Medicine

## 2015-06-20 VITALS — BP 111/69 | HR 68 | Temp 98.5°F | Resp 18 | Ht 69.0 in | Wt 242.0 lb

## 2015-06-20 DIAGNOSIS — Z7984 Long term (current) use of oral hypoglycemic drugs: Secondary | ICD-10-CM | POA: Insufficient documentation

## 2015-06-20 DIAGNOSIS — IMO0002 Reserved for concepts with insufficient information to code with codable children: Secondary | ICD-10-CM | POA: Insufficient documentation

## 2015-06-20 DIAGNOSIS — Z87891 Personal history of nicotine dependence: Secondary | ICD-10-CM | POA: Insufficient documentation

## 2015-06-20 DIAGNOSIS — Z794 Long term (current) use of insulin: Secondary | ICD-10-CM | POA: Insufficient documentation

## 2015-06-20 DIAGNOSIS — M79602 Pain in left arm: Secondary | ICD-10-CM | POA: Insufficient documentation

## 2015-06-20 DIAGNOSIS — E119 Type 2 diabetes mellitus without complications: Secondary | ICD-10-CM | POA: Insufficient documentation

## 2015-06-20 DIAGNOSIS — F17200 Nicotine dependence, unspecified, uncomplicated: Secondary | ICD-10-CM

## 2015-06-20 DIAGNOSIS — H5203 Hypermetropia, bilateral: Secondary | ICD-10-CM

## 2015-06-20 DIAGNOSIS — M542 Cervicalgia: Secondary | ICD-10-CM | POA: Insufficient documentation

## 2015-06-20 DIAGNOSIS — Z720 Tobacco use: Secondary | ICD-10-CM

## 2015-06-20 DIAGNOSIS — E1165 Type 2 diabetes mellitus with hyperglycemia: Secondary | ICD-10-CM | POA: Insufficient documentation

## 2015-06-20 DIAGNOSIS — H5200 Hypermetropia, unspecified eye: Secondary | ICD-10-CM | POA: Insufficient documentation

## 2015-06-20 DIAGNOSIS — Z79899 Other long term (current) drug therapy: Secondary | ICD-10-CM | POA: Insufficient documentation

## 2015-06-20 DIAGNOSIS — M792 Neuralgia and neuritis, unspecified: Secondary | ICD-10-CM

## 2015-06-20 LAB — COMPLETE METABOLIC PANEL WITH GFR
ALBUMIN: 4.2 g/dL (ref 3.6–5.1)
ALK PHOS: 107 U/L (ref 40–115)
ALT: 24 U/L (ref 9–46)
AST: 16 U/L (ref 10–40)
BILIRUBIN TOTAL: 0.4 mg/dL (ref 0.2–1.2)
BUN: 12 mg/dL (ref 7–25)
CO2: 29 mmol/L (ref 20–31)
CREATININE: 1.16 mg/dL (ref 0.60–1.35)
Calcium: 9.8 mg/dL (ref 8.6–10.3)
Chloride: 104 mmol/L (ref 98–110)
GFR, EST AFRICAN AMERICAN: 86 mL/min (ref >=60)
GFR, EST NON AFRICAN AMERICAN: 75 mL/min (ref >=60)
GLUCOSE: 89 mg/dL (ref 65–99)
Potassium: 4.7 mmol/L (ref 3.5–5.3)
SODIUM: 140 mmol/L (ref 135–146)
TOTAL PROTEIN: 7 g/dL (ref 6.1–8.1)

## 2015-06-20 LAB — POCT URINALYSIS DIPSTICK
Bilirubin, UA: NEGATIVE
Glucose, UA: NEGATIVE
Ketones, UA: NEGATIVE
Leukocytes, UA: NEGATIVE
Nitrite, UA: NEGATIVE
Protein, UA: NEGATIVE
SPEC GRAV UA: 1.025
Urobilinogen, UA: 2
pH, UA: 6

## 2015-06-20 LAB — POCT GLYCOSYLATED HEMOGLOBIN (HGB A1C): Hemoglobin A1C: 5.8

## 2015-06-20 LAB — GLUCOSE, POCT (MANUAL RESULT ENTRY): POC Glucose: 104 mg/dl — AB (ref 70–99)

## 2015-06-20 MED ORDER — INSULIN NPH ISOPHANE & REGULAR (70-30) 100 UNIT/ML ~~LOC~~ SUSP: 15.0000 [IU] | Freq: Two times a day (BID) | SUBCUTANEOUS | Status: DC

## 2015-06-20 MED ORDER — TRUEPLUS LANCETS 28G MISC: 1.0000 | Freq: Three times a day (TID) | Status: DC

## 2015-06-20 MED ORDER — METFORMIN HCL 1000 MG PO TABS: 1000.0000 mg | ORAL_TABLET | Freq: Two times a day (BID) | ORAL | Status: DC

## 2015-06-20 MED ORDER — GLUCOSE BLOOD VI STRP: 1.0000 | ORAL_STRIP | Freq: Three times a day (TID) | Status: DC

## 2015-06-20 MED ORDER — TRUE METRIX METER W/DEVICE KIT: 1.0000 | PACK | Status: DC | PRN

## 2015-06-20 NOTE — Patient Instructions (Addendum)
Dustin Hale was seen today for diabetes.  Diagnoses and all orders for this visit:  Controlled type 2 diabetes mellitus without complication, with long-term current use of insulin (HCC) -     HgB A1c -     Glucose (CBG) -     Microalbumin/Creatinine Ratio, Urine -     POCT urinalysis dipstick -     COMPLETE METABOLIC PANEL WITH GFR -     metFORMIN (GLUCOPHAGE) 1000 MG tablet; Take 1 tablet (1,000 mg total) by mouth 2 (two) times daily with a meal. -     insulin NPH-regular Human (NOVOLIN 70/30 RELION) (70-30) 100 UNIT/ML injection; Inject 15 Units into the skin 2 (two) times daily with a meal. -     Blood Glucose Monitoring Suppl (TRUE METRIX METER) W/DEVICE KIT; 1 each by Does not apply route as needed. -     TRUEPLUS LANCETS 28G MISC; 1 each by Does not apply route 3 (three) times daily. -     glucose blood (TRUE METRIX BLOOD GLUCOSE TEST) test strip; 1 each by Other route 3 (three) times daily.  Farsightedness, bilateral  Radicular pain in left arm -     DG Cervical Spine 2 or 3 views; Future -     DG Shoulder Left; Future  Current smoker  Smoking cessation support: smoking cessation hotline: 1-800-QUIT-NOW.  Smoking cessation classes are available through [facility]. Call [phone] or visit our website at [email or web address]   Taper off insulin as tolerated by decrease by 5 U every 2 weeks Start 15 U twice daily now 10 U in two weeks 5 U in two weeks after that  Stop taper if sugars start to rise   Diabetes blood sugar goals  Fasting (in AM before breakfast, 8 hrs of no eating or drinking (except water or unsweetened coffee or tea): 90-110 2 hrs after meals: < 160,   No low sugars: nothing < 70   F.u for flu shot at your convenience  F/u in 6 weeks for shoulder pain  Dr. Adrian Blackwater     Varenicline oral tablets What is this medicine? VARENICLINE (var EN i kleen) is used to help people quit smoking. It can reduce the symptoms caused by stopping  smoking. It is used with a patient support program recommended by your physician. This medicine may be used for other purposes; ask your health care provider or pharmacist if you have questions. What should I tell my health care provider before I take this medicine? They need to know if you have any of these conditions: -bipolar disorder, depression, schizophrenia or other mental illness -heart disease -if you often drink alcohol -kidney disease -peripheral vascular disease -seizures -stroke -suicidal thoughts, plans, or attempt; a previous suicide attempt by you or a family member -an unusual or allergic reaction to varenicline, other medicines, foods, dyes, or preservatives -pregnant or trying to get pregnant -breast-feeding How should I use this medicine? Take this medicine by mouth after eating. Take with a full glass of water. Follow the directions on the prescription label. Take your doses at regular intervals. Do not take your medicine more often than directed. There are 3 ways you can use this medicine to help you quit smoking; talk to your health care professional to decide which plan is right for you: 1) you can choose a quit date and start this medicine 1 week before the quit date, or, 2) you can start taking this medicine before you choose a quit date, and  then pick a quit date between day 8 and 35 days of treatment, or, 3) if you are not sure that you are able or willing to quit smoking right away, start taking this medicine and slowly decrease the amount you smoke as directed by your health care professional with the goal of being cigarette-free by week 12 of treatment. Stick to your plan; ask about support groups or other ways to help you remain cigarette-free. If you are motivated to quit smoking and did not succeed during a previous attempt with this medicine for reasons other than side effects, or if you returned to smoking after this treatment, speak with your health care  professional about whether another course of this medicine may be right for you. A special MedGuide will be given to you by the pharmacist with each prescription and refill. Be sure to read this information carefully each time. Talk to your pediatrician regarding the use of this medicine in children. This medicine is not approved for use in children. Overdosage: If you think you have taken too much of this medicine contact a poison control center or emergency room at once. NOTE: This medicine is only for you. Do not share this medicine with others. What if I miss a dose? If you miss a dose, take it as soon as you can. If it is almost time for your next dose, take only that dose. Do not take double or extra doses. What may interact with this medicine? -alcohol or any product that contains alcohol -insulin -other stop smoking aids -theophylline -warfarin This list may not describe all possible interactions. Give your health care provider a list of all the medicines, herbs, non-prescription drugs, or dietary supplements you use. Also tell them if you smoke, drink alcohol, or use illegal drugs. Some items may interact with your medicine. What should I watch for while using this medicine? Visit your doctor or health care professional for regular check ups. Ask for ongoing advice and encouragement from your doctor or healthcare professional, friends, and family to help you quit. If you smoke while on this medication, quit again Your mouth may get dry. Chewing sugarless gum or sucking hard candy, and drinking plenty of water may help. Contact your doctor if the problem does not go away or is severe. You may get drowsy or dizzy. Do not drive, use machinery, or do anything that needs mental alertness until you know how this medicine affects you. Do not stand or sit up quickly, especially if you are an older patient. This reduces the risk of dizzy or fainting spells. Sleepwalking can happen during treatment  with this medicine, and can sometimes lead to behavior that is harmful to you, other people, or property. Stop taking this medicine and tell your doctor if you start sleepwalking or have other unusual sleep-related activity. Decrease the amount of alcoholic beverages that you drink during treatment with this medicine until you know if this medicine affects your ability to tolerate alcohol. Some people have experienced increased drunkenness (intoxication), unusual or sometimes aggressive behavior, or no memory of things that have happened (amnesia) during treatment with this medicine. The use of this medicine may increase the chance of suicidal thoughts or actions. Pay special attention to how you are responding while on this medicine. Any worsening of mood, or thoughts of suicide or dying should be reported to your health care professional right away. What side effects may I notice from receiving this medicine? Side effects that you should report to  your doctor or health care professional as soon as possible: -allergic reactions like skin rash, itching or hives, swelling of the face, lips, tongue, or throat -acting aggressive, being angry or violent, or acting on dangerous impulses -breathing problems -changes in vision -chest pain or chest tightness -confusion, trouble speaking or understanding -new or worsening depression, anxiety, or panic attacks -extreme increase in activity and talking (mania) -fast, irregular heartbeat -feeling faint or lightheaded, falls -fever -pain in legs when walking -problems with balance, talking, walking -redness, blistering, peeling or loosening of the skin, including inside the mouth -ringing in ears -seeing or hearing things that aren't there (hallucinations) -seizures -sleepwalking -sudden numbness or weakness of the face, arm or leg -thoughts about suicide or dying, or attempts to commit suicide -trouble passing urine or change in the amount of  urine -unusual bleeding or bruising -unusually weak or tired Side effects that usually do not require medical attention (report to your doctor or health care professional if they continue or are bothersome): -constipation -headache -nausea, vomiting -strange dreams -stomach gas -trouble sleeping This list may not describe all possible side effects. Call your doctor for medical advice about side effects. You may report side effects to FDA at 1-800-FDA-1088. Where should I keep my medicine? Keep out of the reach of children. Store at room temperature between 15 and 30 degrees C (59 and 86 degrees F). Throw away any unused medicine after the expiration date. NOTE: This sheet is a summary. It may not cover all possible information. If you have questions about this medicine, talk to your doctor, pharmacist, or health care provider.    2016, Elsevier/Gold Standard. (2015-05-08 16:14:23)

## 2015-06-20 NOTE — Progress Notes (Signed)
F/U DM  Complaining of lt arm pain x 7 month No hx injury  Pain scale #8

## 2015-06-20 NOTE — Assessment & Plan Note (Signed)
  Taper off insulin as tolerated by decrease by 5 U every 2 weeks Start 15 U twice daily now 10 U in two weeks 5 U in two weeks after that  Stop taper if sugars start to rise   Diabetes blood sugar goals  Fasting (in AM before breakfast, 8 hrs of no eating or drinking (except water or unsweetened coffee or tea): 90-110 2 hrs after meals: < 160,   No low sugars: nothing < 70

## 2015-06-20 NOTE — Assessment & Plan Note (Signed)
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

## 2015-06-20 NOTE — Progress Notes (Signed)
Subjective:  Patient ID: Dustin Hale, male    DOB: 04-11-1966  Age: 49 y.o. MRN: [id number]  CC: Diabetes   HPI Rian Busche presents for   1. L arm pain: 6-7 months. No injury. Pain from shoulder down to fingers. Pain is worsening over past few months. Cannot lay L side. Also with L sided neck pain. Decreased L hand grip strength.   2. CHRONIC DIABETES  Disease Monitoring  Blood Sugar Ranges: not checking   Polyuria: no   Visual problems: yes, decreased near vision    Medication Compliance: yes  Medication Side Effects  Hypoglycemia: no   Preventitive Health Care  Eye Exam: done   Foot Exam: done today   Diet pattern: low carb   Exercise: moderate   Social History  Substance Use Topics  . Smoking status: Former Smoker -- 0.50 packs/day for 32 years    Types: Cigarettes  . Smokeless tobacco: Never Used     Comment: quit smoking on 08/27/14  . 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 blood glucose meter kit and supplies KIT Dispense based on patient and insurance preference. Use up to four times daily as directed. (FOR ICD-9 250.00, 250.01). 1 each 2  . cetirizine (ZYRTEC) 10 MG tablet Take 1 tablet (10 mg total) by mouth daily. 30 tablet 3  . ciclopirox (PENLAC) 8 % solution Apply topically at bedtime. Apply over nail and surrounding skin. Apply daily over previous coat. After seven (7) days, may remove with alcohol and continue cycle. 6.6 mL 4  . gabapentin (NEURONTIN) 300 MG capsule Take 1 capsule (300 mg total) by mouth at bedtime. 90 capsule 3  . glucose blood test strip Use as instructed 100 each 12  . insulin NPH-regular Human (NOVOLIN 70/30 RELION) (70-30) 100 UNIT/ML injection Inject 20 Units into the skin 2 (two) times daily with a meal. 40 mL 3  . Insulin Pen Needle 32G X 5 MM MISC 1 Units by Does not apply route daily. 50 each 1  . Lancets Thin MISC 1  Units by Does not apply route daily. 50 each 1  . metFORMIN (GLUCOPHAGE) 500 MG tablet Take 1 tablet (500 mg total) by mouth 2 (two) times daily with a meal. 30 tablet 3  . naproxen (NAPROSYN) 500 MG tablet Take 1 tablet (500 mg total) by mouth 2 (two) times daily. 30 tablet 0  . terbinafine (LAMISIL) 250 MG tablet Take 1 tablet (250 mg total) by mouth daily. 42 tablet 0  . Vitamin D, Ergocalciferol, (DRISDOL) 50000 UNITS CAPS capsule Take 1 capsule (50,000 Units total) by mouth every 7 (seven) days. 12 capsule 0   No facility-administered medications prior to visit.    ROS Review of Systems  Constitutional: Negative for fever, chills, fatigue and unexpected weight change.  Eyes: Positive for visual disturbance.  Respiratory: Negative for cough and shortness of breath.   Cardiovascular: Negative for chest pain, palpitations and leg swelling.  Gastrointestinal: Negative for nausea, vomiting, abdominal pain, diarrhea, constipation and blood in stool.  Endocrine: Negative for polydipsia, polyphagia and polyuria.  Musculoskeletal: Positive for arthralgias and neck pain. Negative for myalgias, back pain and gait problem.  Skin: Negative for rash.  Allergic/Immunologic: Negative for immunocompromised state.  Hematological: Negative for adenopathy. Does not bruise/bleed easily.  Psychiatric/Behavioral: Negative for suicidal ideas, sleep disturbance and dysphoric mood. The  patient is not nervous/anxious.     Objective:  BP 111/69 mmHg  Pulse 68  Temp(Src) 98.5 F (36.9 C) (Oral)  Resp 18  Ht 5' 9" (1.753 m)  Wt 242 lb (109.77 kg)  BMI 35.72 kg/m2  SpO2 100%  BP/Weight 06/20/2015 04/28/2015 03/12/2375  Systolic BP 283 151 761  Diastolic BP 69 90 73  Wt. (Lbs) 242 206 -  BMI 35.72 27.18 -   Physical Exam  Constitutional: He appears well-developed and well-nourished. No distress.  HENT:  Head: Normocephalic and atraumatic.  Neck: Normal range of motion. Neck supple. Muscular  tenderness (L side ) present. No spinous process tenderness present. No rigidity. No edema, no erythema and normal range of motion present.  Neck: Positve spurling's on R  Full neck range of motion Sensation normal in bilateral hands Slight decrease in R grip strength with poor effort during exam  Strength good C4 to T1 distribution No sensory change to C4 to T1   Cardiovascular: Normal rate, regular rhythm, normal heart sounds and intact distal pulses.   Pulmonary/Chest: Effort normal and breath sounds normal.  Musculoskeletal: He exhibits no edema.  Neurological: He is alert.  Skin: Skin is warm and dry. No rash noted. No erythema.  Psychiatric: He has a normal mood and affect.    Lab Results  Component Value Date   HGBA1C 13.2* 09/20/2014   Lab Results  Component Value Date   HGBA1C 5.80 06/20/2015   CBG 104  Assessment & Plan:   Problem List Items Addressed This Visit    Current smoker   Diabetes type 2, controlled ([place]) - Primary (Chronic)   Relevant Medications   metFORMIN (GLUCOPHAGE) 1000 MG tablet   insulin NPH-regular Human (NOVOLIN 70/30 RELION) (70-30) 100 UNIT/ML injection   Blood Glucose Monitoring Suppl (TRUE METRIX METER) W/DEVICE KIT   TRUEPLUS LANCETS 28G MISC   glucose blood (TRUE METRIX BLOOD GLUCOSE TEST) test strip   Other Relevant Orders   HgB A1c (Completed)   Glucose (CBG) (Completed)   Microalbumin/Creatinine Ratio, Urine   POCT urinalysis dipstick (Completed)   COMPLETE METABOLIC PANEL WITH GFR   Farsightedness (Chronic)   Radicular pain in left arm (Chronic)   Relevant Orders   DG Cervical Spine 2 or 3 views   DG Shoulder Left      No orders of the defined types were placed in this encounter.    Follow-up: No Follow-up on file.   Boykin Nearing MD

## 2015-06-21 LAB — MICROALBUMIN / CREATININE URINE RATIO
Creatinine, Urine: 277.1 mg/dL
MICROALB/CREAT RATIO: 2.5 mg/g (ref 0.0–30.0)
Microalb, Ur: 0.7 mg/dL (ref <2.0)

## 2015-06-25 ENCOUNTER — Telehealth: Payer: Self-pay | Admitting: *Deleted

## 2015-06-25 NOTE — Telephone Encounter (Signed)
Date of birth verified by pt Negative lab and urine results given to pt Pt verbalized understanding

## 2015-06-25 NOTE — Telephone Encounter (Signed)
-----   Message from Boykin Nearing, MD sent at 06/23/2015  1:10 PM EDT ----- Normal CMP Normal urine micro albumin

## 2015-06-25 NOTE — Telephone Encounter (Signed)
LVM to return call.

## 2015-06-25 NOTE — Telephone Encounter (Signed)
Pt. Returned call. Please f/u °

## 2015-07-07 ENCOUNTER — Telehealth: Payer: Self-pay | Admitting: Family Medicine

## 2015-07-07 NOTE — Telephone Encounter (Signed)
Pt. Called requesting a letter from PCP stating that he needs his food stamps. Pt. is not employed  Please f/u with pt. For more information.

## 2015-07-07 NOTE — Telephone Encounter (Signed)
Patient came into facility to request a letter from PCP stating that he is a diabetic and needs his food stamps to be able to eat. Please f/u

## 2015-07-08 NOTE — Telephone Encounter (Signed)
Patient came into facility to request a letter from PCP stating that he is a diabetic and needs his food stamps to be able to eat. Please f/u

## 2015-07-08 NOTE — Telephone Encounter (Signed)
Pt requesting letter stating Dx DM type II Letter for Food stamps since has not received food stamps for Oct  Pt not working at this time and looking for a Job

## 2015-07-08 NOTE — Telephone Encounter (Signed)
Patient presents to the clinic wanting to know if the letter for his food stamps is ready for pick up. He is needing this by tomorrow. I mentioned that he can call us in the morning to check on the status, as it would be the second business day. Thank you.

## 2015-07-11 ENCOUNTER — Telehealth: Payer: Self-pay | Admitting: Clinical

## 2015-07-11 NOTE — Telephone Encounter (Signed)
F/u with patient about food resources and job resources needed: Mr. Dustin Hale states that he took the letter from Dr. Adrian Blackwater to DSS, and was told that since the letter did not say that he was medically unable to work, that he still does not qualify for food stamps. Patient aware that there will be a hot meal provided at [employer], [place] at [place]. Mr. Dustin Hale is concerned that food banks will be empty as it gets closer to the holidays, and that he will be unable to eat proper foods to manage his diabetes. Mr. 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

## 2015-07-11 NOTE — Telephone Encounter (Signed)
Letter written and placed up front for pick up Please also give patient coupons to [employer] Also schedule of food kitchens in area

## 2015-07-11 NOTE — Telephone Encounter (Signed)
Patient needs letter from doctor explaining patient is diabetic and eating the correct food is important. Patient is unemployed and social services cut patients benefits out completely.  Patient was getting ebt benefits but can no longer get benefits.  Patient is at unemployment office looking for work now.  Patient can not get foods, eats cookies and snacks, not good for diabetes.

## 2015-07-11 NOTE — Telephone Encounter (Signed)
Pt notified  The little blue/green book given to pt

## 2015-07-23 ENCOUNTER — Telehealth: Payer: Self-pay | Admitting: Family Medicine

## 2015-07-23 DIAGNOSIS — Z0000 Encounter for general adult medical examination without abnormal findings: Secondary | ICD-10-CM | POA: Insufficient documentation

## 2015-07-23 NOTE — Telephone Encounter (Signed)
Dental referral placed Please inform patient  

## 2015-07-23 NOTE — Telephone Encounter (Signed)
-----   Message from [place]in[place]el sent at 07/16/2015  9:41 AM EST ----- Mr. Dearment has orange card, does he need to make appointment for referral to dentist, or can he get a referral without an appointment?

## 2015-10-11 ENCOUNTER — Ambulatory Visit (HOSPITAL_COMMUNITY)
Admission: RE | Admit: 2015-10-11 | Discharge: 2015-10-11 | Disposition: A | Discharge status: Home / Self Care | Payer: MEDICAID | Source: Ambulatory Visit | Attending: Family Medicine | Admitting: Family Medicine

## 2015-10-11 ENCOUNTER — Encounter (HOSPITAL_COMMUNITY): Payer: Self-pay | Admitting: Emergency Medicine

## 2015-10-11 ENCOUNTER — Ambulatory Visit (HOSPITAL_COMMUNITY)
Admission: RE | Admit: 2015-10-11 | Discharge: 2015-10-11 | Disposition: A | Discharge status: Home / Self Care | Payer: Self-pay | Source: Ambulatory Visit | Attending: Family Medicine | Admitting: Family Medicine

## 2015-10-11 ENCOUNTER — Emergency Department (HOSPITAL_COMMUNITY)
Admission: EM | Admit: 2015-10-11 | Discharge: 2015-10-11 | Disposition: A | Discharge status: Home / Self Care | Payer: Self-pay | Attending: Emergency Medicine | Admitting: Emergency Medicine

## 2015-10-11 DIAGNOSIS — E669 Obesity, unspecified: Secondary | ICD-10-CM | POA: Insufficient documentation

## 2015-10-11 DIAGNOSIS — M79602 Pain in left arm: Secondary | ICD-10-CM

## 2015-10-11 DIAGNOSIS — M25512 Pain in left shoulder: Secondary | ICD-10-CM | POA: Insufficient documentation

## 2015-10-11 DIAGNOSIS — M542 Cervicalgia: Secondary | ICD-10-CM | POA: Insufficient documentation

## 2015-10-11 DIAGNOSIS — M19012 Primary osteoarthritis, left shoulder: Secondary | ICD-10-CM | POA: Insufficient documentation

## 2015-10-11 DIAGNOSIS — Z79899 Other long term (current) drug therapy: Secondary | ICD-10-CM | POA: Insufficient documentation

## 2015-10-11 DIAGNOSIS — M5412 Radiculopathy, cervical region: Secondary | ICD-10-CM | POA: Insufficient documentation

## 2015-10-11 DIAGNOSIS — G8929 Other chronic pain: Secondary | ICD-10-CM | POA: Insufficient documentation

## 2015-10-11 DIAGNOSIS — E119 Type 2 diabetes mellitus without complications: Secondary | ICD-10-CM | POA: Insufficient documentation

## 2015-10-11 DIAGNOSIS — M792 Neuralgia and neuritis, unspecified: Secondary | ICD-10-CM

## 2015-10-11 DIAGNOSIS — Z794 Long term (current) use of insulin: Secondary | ICD-10-CM | POA: Insufficient documentation

## 2015-10-11 DIAGNOSIS — R202 Paresthesia of skin: Secondary | ICD-10-CM | POA: Insufficient documentation

## 2015-10-11 DIAGNOSIS — M47892 Other spondylosis, cervical region: Secondary | ICD-10-CM | POA: Insufficient documentation

## 2015-10-11 MED ORDER — HYDROCODONE-ACETAMINOPHEN 5-325 MG PO TABS
1.0000 | ORAL_TABLET | Freq: Once | ORAL | Status: AC
  Administered 2015-10-11: 1 via ORAL
  Filled 2015-10-11: qty 1

## 2015-10-11 MED ORDER — HYDROCODONE-ACETAMINOPHEN 5-325 MG PO TABS: 1.0000 | ORAL_TABLET | Freq: Four times a day (QID) | ORAL | Status: DC | PRN

## 2015-10-11 MED ORDER — MELOXICAM 15 MG PO TABS: 15.0000 mg | ORAL_TABLET | Freq: Every day | ORAL | Status: DC

## 2015-10-11 NOTE — ED Notes (Signed)
Pt c/o left arm pain ongoing for more than 4 weeks. Pt appears tense in triage. When questioned about recent stress, reports, "nothing that I can't handle."

## 2015-10-11 NOTE — ED Provider Notes (Signed)
CSN: [id number]     Arrival date & time 10/11/15  0813 History   First MD Initiated Contact with Patient 10/11/15 0845     Chief Complaint  Patient presents with  . Arm Pain     (Consider location/radiation/quality/duration/timing/severity/associated sxs/prior Treatment) HPI Comments: Dustin Hale is a 50 y.o. male with a PMHx of DM2, who presents to the ED with complaints of chronic left shoulder pain 4.5 years. He was scribed the pain is 10/10 constant throbbing left shoulder pain radiating into his arm and hand, worse with arm movements, with no treatments tried prior to arrival. Associated symptoms include intermittent tingling in his fingertips of his left hand, and somewhat diminished grip strength of his left hand. Chart review reveals he was seen in Aug 2016 for L shoulder pain which his PCP felt was neuropathy at that time. Was seen again on 06/20/15 at which time they ordered xray imaging of neck and L shoulder which it appears he never had done. In that note, they recommended potential MRI if it persisted. Patient states he never had the x-rays done, and has a follow-up appointment on Monday (2 days from now) with his primary care doctor. He does endorse some overhead activities, but denies any injury or trauma to the shoulder.  He denies any fevers, chills, chest pain, shortness breath, abdominal pain, nausea, vomiting, diarrhea, constipation, dysuria, hematuria, joint swelling, skin changes, neck or back pain, or numbness. Aside from the decreased grip strength, no other focal weakness.   Patient is a 50 y.o. male presenting with arm pain. The history is provided by the patient and medical records. No language interpreter was used.  Arm Pain This is a chronic problem. The current episode started more than 1 year ago. The problem occurs constantly. The problem has been unchanged. Associated symptoms include arthralgias (L shoulder/arm) and weakness (decreased grip strength L hand).  Pertinent negatives include no abdominal pain, chest pain, chills, fever, joint swelling, myalgias, nausea, neck pain, numbness, urinary symptoms or vomiting. Exacerbated by: arm movement. He has tried nothing for the symptoms. The treatment provided no relief.    Past Medical History  Diagnosis Date  . Obesity   . Diabetes mellitus without complication ([place])    History reviewed. No pertinent past surgical history. Family History  Problem Relation Age of Onset  . Hypertension Mother   . Hypertension Father   . Hypertension Maternal Grandmother   . Hypertension Maternal Grandfather   . Hypertension Paternal Grandmother    Social History  Substance Use Topics  . Smoking status: Passive Smoke Exposure - Never Smoker -- 0.33 packs/day for 32 years    Types: Cigarettes  . Smokeless tobacco: Never Used     Comment: quit smoking on 08/27/14  . 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  Respiratory: Negative for shortness of breath.   Cardiovascular: Negative for chest pain.  Gastrointestinal: Negative for nausea, vomiting, abdominal pain, diarrhea and constipation.  Genitourinary: Negative for dysuria and hematuria.  Musculoskeletal: Positive for arthralgias (L shoulder/arm). Negative for myalgias, back pain, joint swelling and neck pain.  Skin: Negative for color change.  Allergic/Immunologic: Positive for immunocompromised state (diabetic).  Neurological: Positive for weakness (decreased grip strength L hand). Negative for numbness.       +tingling L hand  Psychiatric/Behavioral: Negative for confusion.   10 Systems reviewed and are negative for acute  change except as noted in the HPI.    Allergies  Review of patient's allergies indicates no known allergies.  Home Medications   Prior to Admission medications   Medication Sig Start Date End Date Taking? Authorizing Provider  blood  glucose meter kit and supplies KIT Dispense based on patient and insurance preference. Use up to four times daily as directed. (FOR ICD-9 250.00, 250.01). 09/22/14   Ejiroghene Arlyce Dice, MD  Blood Glucose Monitoring Suppl (TRUE METRIX METER) W/DEVICE KIT 1 each by Does not apply route as needed. 06/20/15   Josalyn Funches, MD  cetirizine (ZYRTEC) 10 MG tablet Take 1 tablet (10 mg total) by mouth daily. 09/30/14   Lorayne Marek, MD  glucose blood (TRUE METRIX BLOOD GLUCOSE TEST) test strip 1 each by Other route 3 (three) times daily. 06/20/15   Josalyn Funches, MD  insulin NPH-regular Human (NOVOLIN 70/30 RELION) (70-30) 100 UNIT/ML injection Inject 15 Units into the skin 2 (two) times daily with a meal. 06/20/15   Josalyn Funches, MD  Insulin Pen Needle 32G X 5 MM MISC 1 Units by Does not apply route daily. 09/22/14   Ejiroghene Arlyce Dice, MD  metFORMIN (GLUCOPHAGE) 1000 MG tablet Take 1 tablet (1,000 mg total) by mouth 2 (two) times daily with a meal. 06/20/15   Josalyn Funches, MD  TRUEPLUS LANCETS 28G MISC 1 each by Does not apply route 3 (three) times daily. 06/20/15   Josalyn Funches, MD   BP 117/83 mmHg  Pulse 69  Temp(Src) 97.9 F (36.6 C) (Oral)  Resp 20  SpO2 96% Physical Exam  Constitutional: He is oriented to person, place, and time. Vital signs are normal. He appears well-developed and well-nourished.  Non-toxic appearance. No distress.  Afebrile, nontoxic, NAD  HENT:  Head: Normocephalic and atraumatic.  Mouth/Throat: Oropharynx is clear and moist and mucous membranes are normal.  Eyes: Conjunctivae and EOM are normal. Right eye exhibits no discharge. Left eye exhibits no discharge.  Neck: Normal range of motion. Neck supple. No spinous process tenderness and no muscular tenderness present. No rigidity. Normal range of motion present.  FROM intact without spinous process TTP, no bony stepoffs or deformities, no paraspinous muscle TTP or muscle spasms. No rigidity or meningeal signs.  No bruising or swelling.   Cardiovascular: Normal rate, regular rhythm, normal heart sounds and intact distal pulses.  Exam reveals no gallop and no friction rub.   No murmur heard. Pulmonary/Chest: Effort normal and breath sounds normal. No respiratory distress. He has no decreased breath sounds. He has no wheezes. He has no rhonchi. He has no rales.  Abdominal: Soft. Normal appearance and bowel sounds are normal. He exhibits no distension. There is no tenderness. There is no rigidity, no rebound, no guarding and no CVA tenderness.  Musculoskeletal: Normal range of motion.       Left shoulder: He exhibits normal range of motion, no tenderness, no bony tenderness, no swelling, no effusion, no crepitus, no deformity, no spasm, normal pulse and normal strength.  L shoulder with FROM intact, no bony TTP, no muscular TTP or spasms, no swelling/effusion, no crepitus/deformity, negative apley scratch, +pain with resisted ext rotation, no pain with resisted int rotation, neg empty can test. No bruising or abrasions, no warmth or erythema. Strength and sensation grossly intact in all extremities, distal pulses intact.  Soft compartments.   Neurological: He is alert and oriented to person, place, and time. He has normal strength. No sensory deficit.  Skin: Skin is warm, dry and  intact. No rash noted.  Psychiatric: He has a normal mood and affect.  Nursing note and vitals reviewed.   ED Course  Procedures (including critical care time) Labs Review Labs Reviewed - No data to display  Imaging Review No results found. I have personally reviewed and evaluated these images and lab results as part of my medical decision-making.   EKG Interpretation None    EKG: SEE DR. Lyn Hollingshead NOTES  MDM   Final diagnoses:  Chronic left shoulder pain  Chronic pain of left upper extremity  Paresthesia    50 y.o. male here with chronic L arm/shoulder pain x4.55yr. NVI with soft compartments. Some pain with  resisted external rotation, no focal tenderness of shoulder or neck. Was supposed to get xrays outpt but hadn't yet. Discussed that he could go to radiology dept today and have those done, instead of having them done through the ER, and he states he'd rather do that. EKG here unchanged from prior, no ischemic changes. Will give pain meds and start on mobic. F/up with PCP on Monday in 2 days. I explained the diagnosis and have given explicit precautions to return to the ER including for any other new or worsening symptoms. The patient understands and accepts the medical plan as it's been dictated and I have answered their questions. Discharge instructions concerning home care and prescriptions have been given. The patient is STABLE and is discharged to home in good condition.   BP 117/83 mmHg  Pulse 69  Temp(Src) 97.9 F (36.6 C) (Oral)  Resp 20  SpO2 96%  Meds ordered this encounter  Medications  . HYDROcodone-acetaminophen (NORCO/VICODIN) 5-325 MG per tablet 1 tablet    Sig:   . HYDROcodone-acetaminophen (NORCO) 5-325 MG tablet    Sig: Take 1 tablet by mouth every 6 (six) hours as needed for severe pain.    Dispense:  6 tablet    Refill:  0    Order Specific Question:  Supervising Provider    Answer:  MILLER, BRIAN [[id number]]  . meloxicam (MOBIC) 15 MG tablet    Sig: Take 1 tablet (15 mg total) by mouth daily.    Dispense:  30 tablet    Refill:  0    Order Specific Question:  Supervising Provider    Answer:  MNoemi Chapel[[id number]]       Mercedes Camprubi-Soms, PA-C 10/11/15 [id number] SOrlie Dakin MD 10/11/15 1736

## 2015-10-11 NOTE — ED Provider Notes (Signed)
ComPlanes of pain at left shoulder going down left arm for 4.5 years, unchanged today. Denies chest pain denies shortness of breath pain is worse with moving his left shoulder improved by remaining still. No chest pain and no other associated symptoms on exam no distress lungs clear auscultation heart regular rate and rhythm no murmurs left upper 70 no redness no swelling. Full range of motion with pain at shoulder on active motion. Radial pulse 2+ good capillary refill motor strength 5 over 5 overall,. ED ECG REPORT   Date: 10/11/2015  Rate: 75  Rhythm: normal sinus rhythm  QRS Axis: left  Intervals: normal  ST/T Wave abnormalities: normal  Conduction Disutrbances:none  Narrative Inter no change over 04/17/2016pretation:   Old EKG Reviewed: unchanged  I have personally reviewed the EKG tracing and agree with the computerized printout as noted.  Orlie Dakin, MD 10/11/15 [phone]

## 2015-10-11 NOTE — Discharge Instructions (Signed)
Perform gentle range of motion exercises of your shoulder daily. Use heat to your shoulder throughout the day, using a heat pack for 20 minutes at a time every hour. Use mobic daily for pain relief, and use norco as directed as needed for additional relief. Do not drive or operate machinery with pain medication use. Go to the radiology department when you leave today, you have orders for an xray of your neck and shoulder that were ordered by your regular doctor. Follow up with your regular doctor in 1 week for recheck of symptoms and ongoing management of your chronic shoulder pain. Return to the ER for changes or worsening symptoms.    Shoulder Pain The shoulder is the joint that connects your arms to your body. The bones that form the shoulder joint include the upper arm bone (humerus), the shoulder blade (scapula), and the collarbone (clavicle). The top of the humerus is shaped like a ball and fits into a rather flat socket on the scapula (glenoid cavity). A combination of muscles and strong, fibrous tissues that connect muscles to bones (tendons) support your shoulder joint and hold the ball in the socket. Small, fluid-filled sacs (bursae) are located in different areas of the joint. They act as cushions between the bones and the overlying soft tissues and help reduce friction between the gliding tendons and the bone as you move your arm. Your shoulder joint allows a wide range of motion in your arm. This range of motion allows you to do things like scratch your back or throw a ball. However, this range of motion also makes your shoulder more prone to pain from overuse and injury. Causes of shoulder pain can originate from both injury and overuse and usually can be grouped in the following four categories:  Redness, swelling, and pain (inflammation) of the tendon (tendinitis) or the bursae (bursitis).  Instability, such as a dislocation of the joint.  Inflammation of the joint (arthritis).  Broken  bone (fracture). HOME CARE INSTRUCTIONS   Apply ice to the sore area.  Put ice in a plastic bag.  Place a towel between your skin and the bag.  Leave the ice on for 15-20 minutes, 3-4 times per day for the first 2 days, or as directed by your health care provider.  Stop using cold packs if they do not help with the pain.  If you have a shoulder sling or immobilizer, wear it as long as your caregiver instructs. Only remove it to shower or bathe. Move your arm as little as possible, but keep your hand moving to prevent swelling.  Squeeze a soft ball or foam pad as much as possible to help prevent swelling.  Only take over-the-counter or prescription medicines for pain, discomfort, or fever as directed by your caregiver. SEEK MEDICAL CARE IF:   Your shoulder pain increases, or new pain develops in your arm, hand, or fingers.  Your hand or fingers become cold and numb.  Your pain is not relieved with medicines. SEEK IMMEDIATE MEDICAL CARE IF:   Your arm, hand, or fingers are numb or tingling.  Your arm, hand, or fingers are significantly swollen or turn white or blue. MAKE SURE YOU:  1. Understand these instructions. 2. Will watch your condition. 3. Will get help right away if you are not doing well or get worse.   This information is not intended to replace advice given to you by your health care provider. Make sure you discuss any questions you have with your  health care provider.   Document Released: 06/02/2005 Document Revised: 09/13/2014 Document Reviewed: 12/16/2014 Elsevier Interactive Patient Education 2016 [employer]  Shoulder Range of Motion Exercises Shoulder range of motion (ROM) exercises are designed to keep the shoulder moving freely. They are often recommended for people who have shoulder pain. MOVEMENT EXERCISE When you are able, do this exercise 5-6 days per week, or as told by your health care provider. Work toward doing 2 sets of 10 swings. Pendulum  Exercise How To Do This Exercise Lying Down  Lie face-down on a bed with your abdomen close to the side of the bed.  Let your arm hang over the side of the bed.  Relax your shoulder, arm, and hand.  Slowly and gently swing your arm forward and back. Do not use your neck muscles to swing your arm. They should be relaxed. If you are struggling to swing your arm, have someone gently swing it for you. When you do this exercise for the first time, swing your arm at a 15 degree angle for 15 seconds, or swing your arm 10 times. As pain lessens over time, increase the angle of the swing to 30-45 degrees.  Repeat steps 1-4 with the other arm. How To Do This Exercise While Standing  Stand next to a sturdy chair or table and hold on to it with your hand.  Bend forward at the waist.  Bend your knees slightly.  Relax your other arm and let it hang limp.  Relax the shoulder blade of the arm that is hanging and let it drop.  While keeping your shoulder relaxed, use body motion to swing your arm in small circles. The first time you do this exercise, swing your arm for about 30 seconds or 10 times. When you do it next time, swing your arm for a little longer.  Stand up tall and relax.  Repeat steps 1-7, this time changing the direction of the circles.  Repeat steps 1-8 with the other arm. STRETCHING EXERCISES Do these exercises 3-4 times per day on 5-6 days per week or as told by your health care provider. Work toward holding the stretch for 20 seconds. Stretching Exercise 1  Lift your arm straight out in front of you.  Bend your arm 90 degrees at the elbow (right angle) so your forearm goes across your body and looks like the letter "L."  Use your other arm to gently pull the elbow forward and across your body.  Repeat steps 1-3 with the other arm. Stretching Exercise 2 You will need a towel or rope for this exercise.  Bend one arm behind your back with the palm facing outward.  Hold a  towel with your other hand.  Reach the arm that holds the towel above your head, and bend that arm at the elbow. Your wrist should be behind your neck.  Use your free hand to grab the free end of the towel.  With the higher hand, gently pull the towel up behind you.  With the lower hand, pull the towel down behind you.  Repeat steps 1-6 with the other arm. STRENGTHENING EXERCISES Do each of these exercises at four different times of day (sessions) every day or as told by your health care provider. To begin with, repeat each exercise 5 times (repetitions). Work toward doing 3 sets of 12 repetitions or as told by your health care provider. Strengthening Exercise 1 You will need a light weight for this activity. As you grow  stronger, you may use a heavier weight. 4. Standing with a weight in your hand, lift your arm straight out to the side until it is at the same height as your shoulder. 5. Bend your arm at 90 degrees so that your fingers are pointing to the ceiling. 6. Slowly raise your hand until your arm is straight up in the air. 7. Repeat steps 1-3 with the other arm. Strengthening Exercise 2 You will need a light weight for this activity. As you grow stronger, you may use a heavier weight. 1. Standing with a weight in your hand, gradually move your straight arm in an arc, starting at your side, then out in front of you, then straight up over your head. 2. Gradually move your other arm in an arc, starting at your side, then out in front of you, then straight up over your head. 3. Repeat steps 1-2 with the other arm. Strengthening Exercise 3 You will need an elastic band for this activity. As you grow stronger, gradually increase the size of the bands or increase the number of bands that you use at one time. 1. While standing, hold an elastic band in one hand and raise that arm up in the air. 2. With your other hand, pull down the band until that hand is by your side. 3. Repeat steps  1-2 with the other arm.   This information is not intended to replace advice given to you by your health care provider. Make sure you discuss any questions you have with your health care provider.   Document Released: 05/22/2003 Document Revised: 01/07/2015 Document Reviewed: 08/19/2014 Elsevier Interactive Patient Education 2016 Elsevier Inc.  Paresthesia Paresthesia is a burning or prickling feeling. This feeling can happen in any part of the body. It often happens in the hands, arms, legs, or feet. Usually, it is not painful. In most cases, the feeling goes away in a short time and is not a sign of a serious problem. HOME CARE  Avoid drinking alcohol.  Try massage or needle therapy (acupuncture) to help with your problems.  Keep all follow-up visits as told by your doctor. This is important. GET HELP IF:  You keep on having episodes of paresthesia.  Your burning or prickling feeling gets worse when you walk.  You have pain or cramps.  You feel dizzy.  You have a rash. GET HELP RIGHT AWAY IF:  You feel weak.  You have trouble walking or moving.  You have problems speaking, understanding, or seeing.  You feel confused.  You cannot control when you pee (urinate) or poop (bowel movement).  You lose feeling (numbness) after an injury.  You pass out (faint).   This information is not intended to replace advice given to you by your health care provider. Make sure you discuss any questions you have with your health care provider.   Document Released: 08/05/2008 Document Revised: 01/07/2015 Document Reviewed: 08/19/2014 Elsevier Interactive Patient Education 2016 [place] therapy can help ease sore, stiff, injured, and tight muscles and joints. Heat relaxes your muscles, which may help ease your pain. Heat therapy should only be used on old, pre-existing, or long-lasting (chronic) injuries. Do not use heat therapy unless told by your doctor. HOW  TO USE HEAT THERAPY There are several different kinds of heat therapy, including:  Moist heat pack.  Warm water bath.  Hot water bottle.  Electric heating pad.  Heated gel pack.  Heated wrap.  Electric heating pad.  GENERAL HEAT THERAPY RECOMMENDATIONS   Do not sleep while using heat therapy. Only use heat therapy while you are awake.  Your skin may turn pink while using heat therapy. Do not use heat therapy if your skin turns red.  Do not use heat therapy if you have new pain.  High heat or long exposure to heat can cause burns. Be careful when using heat therapy to avoid burning your skin.  Do not use heat therapy on areas of your skin that are already irritated, such as with a rash or sunburn. GET HELP IF:   You have blisters, redness, swelling (puffiness), or numbness.  You have new pain.  Your pain is worse. MAKE SURE YOU:  Understand these instructions.  Will watch your condition.  Will get help right away if you are not doing well or get worse.   This information is not intended to replace advice given to you by your health care provider. Make sure you discuss any questions you have with your health care provider.   Document Released: 11/15/2011 Document Revised: 09/13/2014 Document Reviewed: 10/16/2013 Elsevier Interactive Patient Education [employer].

## 2015-10-13 ENCOUNTER — Telehealth: Payer: Self-pay | Admitting: Family Medicine

## 2015-10-13 NOTE — Telephone Encounter (Signed)
-----   Message from Boykin Nearing, MD sent at 10/12/2015  4:17 PM EST ----- Degenerative changes in L shoulder x-ray

## 2015-10-13 NOTE — Telephone Encounter (Signed)
-----   Message from Boykin Nearing, MD sent at 10/12/2015  4:17 PM EST ----- Degenerative changes in neck x-ray

## 2015-10-13 NOTE — Telephone Encounter (Signed)
Pt. Called requesting a med refill on Metformin. Pt. Already has an appointment scheduled. Please f/u

## 2015-10-13 NOTE — Telephone Encounter (Signed)
Patient requesting a refill for metformin.  Please follow up with patient.

## 2015-10-13 NOTE — Telephone Encounter (Signed)
LVM to return call   Medication refill. Needs Office visit for future refills Please schedule appointment

## 2015-10-14 ENCOUNTER — Other Ambulatory Visit: Payer: Self-pay | Admitting: *Deleted

## 2015-10-14 DIAGNOSIS — Z794 Long term (current) use of insulin: Principal | ICD-10-CM

## 2015-10-14 DIAGNOSIS — E119 Type 2 diabetes mellitus without complications: Secondary | ICD-10-CM

## 2015-10-14 MED ORDER — METFORMIN HCL 1000 MG PO TABS: 1000.0000 mg | ORAL_TABLET | Freq: Two times a day (BID) | ORAL | Status: DC

## 2015-10-14 MED FILL — metFORMIN HCL 1000 MG TABS: 1000 | 30 days supply | Qty: 60 | Fill #0

## 2015-10-27 ENCOUNTER — Telehealth: Payer: Self-pay | Admitting: Family Medicine

## 2015-10-27 ENCOUNTER — Encounter: Payer: Self-pay | Admitting: Family Medicine

## 2015-10-27 ENCOUNTER — Ambulatory Visit: Payer: Self-pay | Attending: Family Medicine | Admitting: Family Medicine

## 2015-10-27 ENCOUNTER — Ambulatory Visit (HOSPITAL_BASED_OUTPATIENT_CLINIC_OR_DEPARTMENT_OTHER): Payer: Self-pay | Admitting: Clinical

## 2015-10-27 VITALS — BP 111/73 | HR 71 | Temp 98.2°F | Resp 16 | Ht 69.0 in | Wt 226.0 lb

## 2015-10-27 DIAGNOSIS — G8929 Other chronic pain: Secondary | ICD-10-CM | POA: Insufficient documentation

## 2015-10-27 DIAGNOSIS — M792 Neuralgia and neuritis, unspecified: Secondary | ICD-10-CM

## 2015-10-27 DIAGNOSIS — M19012 Primary osteoarthritis, left shoulder: Secondary | ICD-10-CM | POA: Insufficient documentation

## 2015-10-27 DIAGNOSIS — E119 Type 2 diabetes mellitus without complications: Secondary | ICD-10-CM | POA: Insufficient documentation

## 2015-10-27 DIAGNOSIS — Z79899 Other long term (current) drug therapy: Secondary | ICD-10-CM | POA: Insufficient documentation

## 2015-10-27 DIAGNOSIS — K089 Disorder of teeth and supporting structures, unspecified: Secondary | ICD-10-CM

## 2015-10-27 DIAGNOSIS — F418 Other specified anxiety disorders: Secondary | ICD-10-CM

## 2015-10-27 DIAGNOSIS — Z7984 Long term (current) use of oral hypoglycemic drugs: Secondary | ICD-10-CM | POA: Insufficient documentation

## 2015-10-27 DIAGNOSIS — Z794 Long term (current) use of insulin: Secondary | ICD-10-CM | POA: Insufficient documentation

## 2015-10-27 LAB — GLUCOSE, POCT (MANUAL RESULT ENTRY): POC GLUCOSE: 96 mg/dL (ref 70–99)

## 2015-10-27 LAB — POCT GLYCOSYLATED HEMOGLOBIN (HGB A1C): Hemoglobin A1C: 5.7

## 2015-10-27 MED ORDER — ACETAMINOPHEN-CODEINE #3 300-30 MG PO TABS: 1.0000 | ORAL_TABLET | Freq: Three times a day (TID) | ORAL | Status: DC | PRN

## 2015-10-27 MED ORDER — METHYLPREDNISOLONE 4 MG PO TBPK: ORAL_TABLET | ORAL | Status: AC

## 2015-10-27 MED ORDER — MELOXICAM 15 MG PO TABS: 15.0000 mg | ORAL_TABLET | Freq: Every day | ORAL | Status: DC

## 2015-10-27 MED FILL — METHYLPREDNISOLONE 4 MG TAB: 4 | 6 days supply | Qty: 21 | Fill #0

## 2015-10-27 MED FILL — MELOXICAM 15 MG TABLET: 15 | 30 days supply | Qty: 30 | Fill #0

## 2015-10-27 NOTE — Assessment & Plan Note (Signed)
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

## 2015-10-27 NOTE — Patient Instructions (Addendum)
Dustin Hale was seen today for hospitalization follow-up.  Diagnoses and all orders for this visit:  Controlled type 2 diabetes mellitus without complication, with long-term current use of insulin (HCC) -     POCT glycosylated hemoglobin (Hb A1C) -     POCT glucose (manual entry)  Radicular pain in left arm -     acetaminophen-codeine (TYLENOL #3) 300-30 MG tablet; Take 1 tablet by mouth every 8 (eight) hours as needed for moderate pain. -     meloxicam (MOBIC) 15 MG tablet; Take 1 tablet (15 mg total) by mouth daily. -     methylPREDNISolone (MEDROL DOSEPAK) 4 MG TBPK tablet; Taper per packet insert  Primary osteoarthritis of left shoulder -     acetaminophen-codeine (TYLENOL #3) 300-30 MG tablet; Take 1 tablet by mouth every 8 (eight) hours as needed for moderate pain. -     meloxicam (MOBIC) 15 MG tablet; Take 1 tablet (15 mg total) by mouth daily. -     methylPREDNISolone (MEDROL DOSEPAK) 4 MG TBPK tablet; Taper per packet insert   Complete depo medrol course, before mobic. Take both with food. Tylenol #3 for pain  F/u in 4 weeks for shoulder pain   Dr .Adrian Blackwater   Generic Shoulder Exercises EXERCISES  RANGE OF MOTION (ROM) AND STRETCHING EXERCISES These exercises may help you when beginning to rehabilitate your injury. Your symptoms may resolve with or without further involvement from your physician, physical therapist or athletic trainer. While completing these exercises, remember:   Restoring tissue flexibility helps normal motion to return to the joints. This allows healthier, less painful movement and activity.  An effective stretch should be held for at least 30 seconds.  A stretch should never be painful. You should only feel a gentle lengthening or release in the stretched tissue. ROM - Pendulum  Bend at the waist so that your right / left arm falls away from your body. Support yourself with your opposite hand on a solid surface, such as a table or a countertop.  Your  right / left arm should be perpendicular to the ground. If it is not perpendicular, you need to lean over farther. Relax the muscles in your right / left arm and shoulder as much as possible.  Gently sway your hips and trunk so they move your right / left arm without any use of your right / left shoulder muscles.  Progress your movements so that your right / left arm moves side to side, then forward and backward, and finally, both clockwise and counterclockwise.  Complete __________ repetitions in each direction. Many people use this exercise to relieve discomfort in their shoulder as well as to gain range of motion. Repeat __________ times. Complete this exercise __________ times per day. STRETCH - Flexion, Standing  Stand with good posture. With an underhand grip on your right / left hand and an overhand grip on the opposite hand, grasp a broomstick or cane so that your hands are a little more than shoulder-width apart.  Keeping your right / left elbow straight and shoulder muscles relaxed, push the stick with your opposite hand to raise your right / left arm in front of your body and then overhead. Raise your arm until you feel a stretch in your right / left shoulder, but before you have increased shoulder pain.  Try to avoid shrugging your right / left shoulder as your arm rises by keeping your shoulder blade tucked down and toward your mid-back spine. Hold __________ seconds.  Slowly  return to the starting position. Repeat __________ times. Complete this exercise __________ times per day. STRETCH - Internal Rotation  Place your right / left hand behind your back, palm-up.  Throw a towel or belt over your opposite shoulder. Grasp the towel/belt with your right / left hand.  While keeping an upright posture, gently pull up on the towel/belt until you feel a stretch in the front of your right / left shoulder.  Avoid shrugging your right / left shoulder as your arm rises by keeping your  shoulder blade tucked down and toward your mid-back spine.  Hold __________. Release the stretch by lowering your opposite hand. Repeat __________ times. Complete this exercise __________ times per day. STRETCH - External Rotation and Abduction  Stagger your stance through a doorframe. It does not matter which foot is forward.  As instructed by your physician, physical therapist or athletic trainer, place your hands:  And forearms above your head and on the door frame.  And forearms at head-height and on the door frame.  At elbow-height and on the door frame.  Keeping your head and chest upright and your stomach muscles tight to prevent over-extending your low-back, slowly shift your weight onto your front foot until you feel a stretch across your chest and/or in the front of your shoulders.  Hold __________ seconds. Shift your weight to your back foot to release the stretch. Repeat __________ times. Complete this stretch __________ times per day.  STRENGTHENING EXERCISES  These exercises may help you when beginning to rehabilitate your injury. They may resolve your symptoms with or without further involvement from your physician, physical therapist or athletic trainer. While completing these exercises, remember:   Muscles can gain both the endurance and the strength needed for everyday activities through controlled exercises.  Complete these exercises as instructed by your physician, physical therapist or athletic trainer. Progress the resistance and repetitions only as guided.  You may experience muscle soreness or fatigue, but the pain or discomfort you are trying to eliminate should never worsen during these exercises. If this pain does worsen, stop and make certain you are following the directions exactly. If the pain is still present after adjustments, discontinue the exercise until you can discuss the trouble with your clinician.  If advised by your physician, during your  recovery, avoid activity or exercises which involve actions that place your right / left hand or elbow above your head or behind your back or head. These positions stress the tissues which are trying to heal. STRENGTH - Scapular Depression and Adduction  With good posture, sit on a firm chair. Supported your arms in front of you with pillows, arm rests or a table top. Have your elbows in line with the sides of your body.  Gently draw your shoulder blades down and toward your mid-back spine. Gradually increase the tension without tensing the muscles along the top of your shoulders and the back of your neck.  Hold for __________ seconds. Slowly release the tension and relax your muscles completely before completing the next repetition.  After you have practiced this exercise, remove the arm support and complete it in standing as well as sitting. Repeat __________ times. Complete this exercise __________ times per day.  STRENGTH - External Rotators  Secure a rubber exercise band/tubing to a fixed object so that it is at the same height as your right / left elbow when you are standing or sitting on a firm surface.  Stand or sit so that the  secured exercise band/tubing is at your side that is not injured.  Bend your elbow 90 degrees. Place a folded towel or small pillow under your right / left arm so that your elbow is a few inches away from your side.  Keeping the tension on the exercise band/tubing, pull it away from your body, as if pivoting on your elbow. Be sure to keep your body steady so that the movement is only coming from your shoulder rotating.  Hold __________ seconds. Release the tension in a controlled manner as you return to the starting position. Repeat __________ times. Complete this exercise __________ times per day.  STRENGTH - Supraspinatus  Stand or sit with good posture. Grasp a __________ weight or an exercise band/tubing so that your hand is "thumbs-up," like when you  shake hands.  Slowly lift your right / left hand from your thigh into the air, traveling about 30 degrees from straight out at your side. Lift your hand to shoulder height or as far as you can without increasing any shoulder pain. Initially, many people do not lift their hands above shoulder height.  Avoid shrugging your right / left shoulder as your arm rises by keeping your shoulder blade tucked down and toward your mid-back spine.  Hold for __________ seconds. Control the descent of your hand as you slowly return to your starting position. Repeat __________ times. Complete this exercise __________ times per day.  STRENGTH - Shoulder Extensors  Secure a rubber exercise band/tubing so that it is at the height of your shoulders when you are either standing or sitting on a firm arm-less chair.  With a thumbs-up grip, grasp an end of the band/tubing in each hand. Straighten your elbows and lift your hands straight in front of you at shoulder height. Step back away from the secured end of band/tubing until it becomes tense.  Squeezing your shoulder blades together, pull your hands down to the sides of your thighs. Do not allow your hands to go behind you.  Hold for __________ seconds. Slowly ease the tension on the band/tubing as you reverse the directions and return to the starting position. Repeat __________ times. Complete this exercise __________ times per day.  STRENGTH - Scapular Retractors  Secure a rubber exercise band/tubing so that it is at the height of your shoulders when you are either standing or sitting on a firm arm-less chair.  With a palm-down grip, grasp an end of the band/tubing in each hand. Straighten your elbows and lift your hands straight in front of you at shoulder height. Step back away from the secured end of band/tubing until it becomes tense.  Squeezing your shoulder blades together, draw your elbows back as you bend them. Keep your upper arm lifted away from your  body throughout the exercise.  Hold __________ seconds. Slowly ease the tension on the band/tubing as you reverse the directions and return to the starting position. Repeat __________ times. Complete this exercise __________ times per day. STRENGTH - Scapular Depressors  Find a sturdy chair without wheels, such as a from a dining room table.  Keeping your feet on the floor, lift your bottom from the seat and lock your elbows.  Keeping your elbows straight, allow gravity to pull your body weight down. Your shoulders will rise toward your ears.  Raise your body against gravity by drawing your shoulder blades down your back, shortening the distance between your shoulders and ears. Although your feet should always maintain contact with the floor, your  feet should progressively support less body weight as you get stronger.  Hold __________ seconds. In a controlled and slow manner, lower your body weight to begin the next repetition. Repeat __________ times. Complete this exercise __________ times per day.    This information is not intended to replace advice given to you by your health care provider. Make sure you discuss any questions you have with your health care provider.   Document Released: 07/07/2005 Document Revised: 09/13/2014 Document Reviewed: 12/05/2008 Elsevier Interactive Patient Education [employer].

## 2015-10-27 NOTE — Progress Notes (Signed)
ASSESSMENT: Pt currently experiencing symptoms of anxiety and depression. Pt needs to f/u with PCP and [facility]; would benefit from psychoeducation and Solution-Focused therapy to cope with symptoms of anxiety and depression.  Stage of Change: contemplative  PLAN: 1. F/U with behavioral health consultant in two weeks 2. Psychiatric Medications: none 3. Behavioral recommendation(s):   -Consider walk-in [facility] or [place] for [facility] med management -Consider importance of self care for wellbeing -Consider reading educational material regarding coping with symptoms of anxiety and depression -Consider food pantry program at [facility], call first week March 2017 to enroll -Consider obtaining orange card transportation application AND obtaining [place] bus ID for reduced-fare transportation  SUBJECTIVE: Pt. referred by Dr Adrian Blackwater for Symptoms of anxiety and depression:  Pt. reports the following symptoms/concerns: Pt states that he has "fleeting thoughts" of suicide, but that he has no plan, and has not ever made an attempt. 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 He feels he is unable to help financially, and that it is straining his relationships with fiancee and other friends and family.  Duration of problem: At least three months Severity: moderate  OBJECTIVE: Orientation & Cognition: Oriented x3. Thought processes normal and appropriate to situation. Mood: appropriate. Affect: appropriate Appearance: appropriate Risk of harm to self or others: low risk of harm to self today, no known risk to others (no SI today, no plan) Substance use: tobacco Assessments administered: PHQ9: 15/ GAD7: 15  Diagnosis: Anxiety and depression CPT Code: F41.8 -------------------------------------------- Other(s) present in the room: none  Time spent with patient in exam room: 25 minutes, 12:00-12:25   Depression screen [facility] 2/9 10/27/2015  Decreased  Interest 1  Down, Depressed, Hopeless 3  PHQ - 2 Score 4  Altered sleeping 0  Tired, decreased energy 3  Change in appetite 0  Feeling bad or failure about yourself  3  Trouble concentrating 1  Moving slowly or fidgety/restless 1  Suicidal thoughts 3  PHQ-9 Score 15

## 2015-10-27 NOTE — Telephone Encounter (Signed)
Patient states he needs a dental referral

## 2015-10-27 NOTE — Progress Notes (Signed)
HFU lt arm pain  Pain scale #9 Former smoker  Suicidal thoughts no plans no attempts

## 2015-10-27 NOTE — Addendum Note (Signed)
Addended by: Boykin Nearing on: 10/27/2015 05:41 PM   Modules accepted: Orders

## 2015-10-27 NOTE — Assessment & Plan Note (Addendum)
A; diabetes remains well controlled P: Continue current regimen

## 2015-10-27 NOTE — Progress Notes (Signed)
Subjective:  Patient ID: Dustin Hale, male    DOB: February 05, 1966  Age: 50 y.o. MRN: [id number]  CC: Hospitalization Follow-up   HPI [name] presents for   1. ED f/u: chronic L shoulder pain. Patient has had L shoulder pain for past 10-11 months. He went to [facility] ED on 10/11/2015. He completed x-rays which revealed mild degenerative changes in his neck and shoulder. He has not filled prescribed vicodin or mobic. He has daily pain. Pain is worse with L shoulder movement. Pain radiates down arm from L lateral shoulder to L radial wrist. No redness, swelling or numbness. No known injury.   2. CHRONIC DIABETES: compliant with medications. No pain or numbness in LE. No rash. No low CBGs   Social History  Substance Use Topics  . Smoking status: Passive Smoke Exposure - Never Smoker -- 0.33 packs/day for 32 years    Types: Cigarettes  . Smokeless tobacco: Never Used     Comment: quit smoking on 08/27/14  . 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 blood glucose meter kit and supplies KIT Dispense based on patient and insurance preference. Use up to four times daily as directed. (FOR ICD-9 250.00, 250.01). 1 each 2  . Blood Glucose Monitoring Suppl (TRUE METRIX METER) W/DEVICE KIT 1 each by Does not apply route as needed. 1 kit 0  . cetirizine (ZYRTEC) 10 MG tablet Take 1 tablet (10 mg total) by mouth daily. 30 tablet 3  . glucose blood (TRUE METRIX BLOOD GLUCOSE TEST) test strip 1 each by Other route 3 (three) times daily. 100 each 11  . insulin NPH-regular Human (NOVOLIN 70/30 RELION) (70-30) 100 UNIT/ML injection Inject 15 Units into the skin 2 (two) times daily with a meal. 40 mL 3  . Insulin Pen Needle 32G X 5 MM MISC 1 Units by Does not apply route daily. 50 each 1  . metFORMIN (GLUCOPHAGE) 1000 MG tablet Take 1 tablet (1,000 mg total) by mouth 2 (two) times daily with a meal. 60  tablet 0  . TRUEPLUS LANCETS 28G MISC 1 each by Does not apply route 3 (three) times daily. 100 each 11  . HYDROcodone-acetaminophen (NORCO) 5-325 MG tablet Take 1 tablet by mouth every 6 (six) hours as needed for severe pain. (Patient not taking: Reported on 10/27/2015) 6 tablet 0  . meloxicam (MOBIC) 15 MG tablet Take 1 tablet (15 mg total) by mouth daily. (Patient not taking: Reported on 10/27/2015) 30 tablet 0   No facility-administered medications prior to visit.    ROS Review of Systems  Constitutional: Negative for fever, chills, fatigue and unexpected weight change.  Eyes: Negative for visual disturbance.  Respiratory: Negative for cough and shortness of breath.   Cardiovascular: Negative for chest pain, palpitations and leg swelling.  Gastrointestinal: Negative for nausea, vomiting, abdominal pain, diarrhea, constipation and blood in stool.  Endocrine: Negative for polydipsia, polyphagia and polyuria.  Musculoskeletal: Positive for arthralgias (L shoulder ). Negative for myalgias, back pain, gait problem and neck pain.  Skin: Negative for rash.  Allergic/Immunologic: Negative for immunocompromised state.  Hematological: Negative for adenopathy. Does not bruise/bleed easily.  Psychiatric/Behavioral: Negative for suicidal ideas, sleep disturbance and dysphoric mood. The patient is not nervous/anxious.     Objective:  BP 111/73 mmHg  Pulse 71  Temp(Src) 98.2 F (36.8 C) (Oral)  Resp 16  Ht  5' 9"  (1.753 m)  Wt 226 lb (102.513 kg)  BMI 33.36 kg/m2  SpO2 99%  BP/Weight 10/27/2015 10/11/2015 09/32/3557  Systolic BP 322 025 427  Diastolic BP 73 83 69  Wt. (Lbs) 226 - 242  BMI 33.36 - 35.72    Physical Exam  Constitutional: He appears well-developed and well-nourished. No distress.  HENT:  Head: Normocephalic and atraumatic.  Neck: Normal range of motion. Neck supple. No spinous process tenderness and no muscular tenderness (L side ) present. No rigidity. No edema, no erythema  and normal range of motion present.  Cardiovascular: Normal rate, regular rhythm, normal heart sounds and intact distal pulses.   Pulmonary/Chest: Effort normal and breath sounds normal.  Musculoskeletal: He exhibits no edema.       Left shoulder: He exhibits decreased range of motion and pain. He exhibits no tenderness, no bony tenderness, no swelling, no effusion, no crepitus, no deformity, no laceration, no spasm, normal pulse and normal strength.  Neurological: He is alert.  Skin: Skin is warm and dry. No rash noted. No erythema.  Psychiatric: He has a normal mood and affect.    Lab Results  Component Value Date   HGBA1C 5.80 06/20/2015   Lab Results  Component Value Date   HGBA1C 5.70 10/27/2015    CBG 96  Assessment & Plan:   Gurdeep was seen today for hospitalization follow-up.  Diagnoses and all orders for this visit:  Controlled type 2 diabetes mellitus without complication, with long-term current use of insulin (HCC) -     POCT glycosylated hemoglobin (Hb A1C) -     POCT glucose (manual entry)  Radicular pain in left arm -     acetaminophen-codeine (TYLENOL #3) 300-30 MG tablet; Take 1 tablet by mouth every 8 (eight) hours as needed for moderate pain. -     meloxicam (MOBIC) 15 MG tablet; Take 1 tablet (15 mg total) by mouth daily. -     methylPREDNISolone (MEDROL DOSEPAK) 4 MG TBPK tablet; Taper per packet insert  Primary osteoarthritis of left shoulder -     acetaminophen-codeine (TYLENOL #3) 300-30 MG tablet; Take 1 tablet by mouth every 8 (eight) hours as needed for moderate pain. -     meloxicam (MOBIC) 15 MG tablet; Take 1 tablet (15 mg total) by mouth daily. -     methylPREDNISolone (MEDROL DOSEPAK) 4 MG TBPK tablet; Taper per packet insert    No orders of the defined types were placed in this encounter.    Follow-up: No Follow-up on file.   Boykin Nearing MD

## 2015-10-28 NOTE — Telephone Encounter (Signed)
Referral was place on Feb 20. Pt was informed at [facility]

## 2015-11-25 ENCOUNTER — Ambulatory Visit: Payer: Self-pay | Admitting: Family Medicine

## 2016-01-08 ENCOUNTER — Ambulatory Visit: Payer: Self-pay | Admitting: Family Medicine

## 2016-02-13 ENCOUNTER — Emergency Department (HOSPITAL_COMMUNITY)
Admission: EM | Admit: 2016-02-13 | Discharge: 2016-02-13 | Disposition: A | Discharge status: Home / Self Care | Payer: Self-pay | Attending: Emergency Medicine | Admitting: Emergency Medicine

## 2016-02-13 ENCOUNTER — Encounter (HOSPITAL_COMMUNITY): Payer: Self-pay

## 2016-02-13 ENCOUNTER — Emergency Department (HOSPITAL_COMMUNITY): Payer: Self-pay

## 2016-02-13 DIAGNOSIS — Z79899 Other long term (current) drug therapy: Secondary | ICD-10-CM | POA: Insufficient documentation

## 2016-02-13 DIAGNOSIS — Z794 Long term (current) use of insulin: Secondary | ICD-10-CM | POA: Insufficient documentation

## 2016-02-13 DIAGNOSIS — Z6835 Body mass index (BMI) 35.0-35.9, adult: Secondary | ICD-10-CM | POA: Insufficient documentation

## 2016-02-13 DIAGNOSIS — Z7984 Long term (current) use of oral hypoglycemic drugs: Secondary | ICD-10-CM | POA: Insufficient documentation

## 2016-02-13 DIAGNOSIS — Z7722 Contact with and (suspected) exposure to environmental tobacco smoke (acute) (chronic): Secondary | ICD-10-CM | POA: Insufficient documentation

## 2016-02-13 DIAGNOSIS — E669 Obesity, unspecified: Secondary | ICD-10-CM | POA: Insufficient documentation

## 2016-02-13 DIAGNOSIS — R0789 Other chest pain: Secondary | ICD-10-CM

## 2016-02-13 DIAGNOSIS — E119 Type 2 diabetes mellitus without complications: Secondary | ICD-10-CM | POA: Insufficient documentation

## 2016-02-13 DIAGNOSIS — R55 Syncope and collapse: Secondary | ICD-10-CM

## 2016-02-13 LAB — CBC
HCT: 43.9 % (ref 39.0–52.0)
HEMOGLOBIN: 14.4 g/dL (ref 13.0–17.0)
MCH: 29.6 pg (ref 26.0–34.0)
MCHC: 32.8 g/dL (ref 30.0–36.0)
MCV: 90.1 fL (ref 78.0–100.0)
PLATELETS: 163 10*3/uL (ref 150–400)
RBC: 4.87 MIL/uL (ref 4.22–5.81)
RDW: 13.3 % (ref 11.5–15.5)
WBC: 8.6 10*3/uL (ref 4.0–10.5)

## 2016-02-13 LAB — BASIC METABOLIC PANEL
ANION GAP: 8 (ref 5–15)
BUN: 11 mg/dL (ref 6–20)
CALCIUM: 9.4 mg/dL (ref 8.9–10.3)
CO2: 23 mmol/L (ref 22–32)
Chloride: 108 mmol/L (ref 101–111)
Creatinine, Ser: 0.97 mg/dL (ref 0.61–1.24)
Glucose, Bld: 103 mg/dL — ABNORMAL HIGH (ref 65–99)
Potassium: 3.4 mmol/L — ABNORMAL LOW (ref 3.5–5.1)
SODIUM: 139 mmol/L (ref 135–145)

## 2016-02-13 LAB — CBG MONITORING, ED: GLUCOSE-CAPILLARY: 111 mg/dL — AB (ref 65–99)

## 2016-02-13 LAB — I-STAT TROPONIN, ED
TROPONIN I, POC: 0 ng/mL (ref 0.00–0.08)
Troponin i, poc: 0 ng/mL (ref 0.00–0.08)

## 2016-02-13 NOTE — Discharge Instructions (Signed)
Push fluids: take small frequent sips of water or Gatorade, do not drink any soda, juice or caffeinated beverages.    Please follow with your primary care doctor in the next 7days for a check-up. They must obtain records for further management.   Do not hesitate to return to the Emergency Department for any new, worsening or concerning symptoms.     Near-Syncope Near-syncope (commonly known as near fainting) is sudden weakness, dizziness, or feeling like you might pass out. During an episode of near-syncope, you may also develop pale skin, have tunnel vision, or feel sick to your stomach (nauseous). Near-syncope may occur when getting up after sitting or while standing for a long time. It is caused by a sudden decrease in blood flow to the brain. This decrease can result from various causes or triggers, most of which are not serious. However, because near-syncope can sometimes be a sign of something serious, a medical evaluation is required. The specific cause is often not determined. HOME CARE INSTRUCTIONS  Monitor your condition for any changes. The following actions may help to alleviate any discomfort you are experiencing:  Have someone stay with you until you feel stable.  Lie down right away and prop your feet up if you start feeling like you might faint. Breathe deeply and steadily. Wait until all the symptoms have passed. Most of these episodes last only a few minutes. You may feel tired for several hours.   Drink enough fluids to keep your urine clear or pale yellow.   If you are taking blood pressure or heart medicine, get up slowly when seated or lying down. Take several minutes to sit and then stand. This can reduce dizziness.  Follow up with your health care provider as directed. SEEK IMMEDIATE MEDICAL CARE IF:   You have a severe headache.   You have unusual pain in the chest, abdomen, or back.   You are bleeding from the mouth or rectum, or you have black or tarry  stool.   You have an irregular or very fast heartbeat.   You have repeated fainting or have seizure-like jerking during an episode.   You faint when sitting or lying down.   You have confusion.   You have difficulty walking.   You have severe weakness.   You have vision problems.  MAKE SURE YOU:   Understand these instructions.  Will watch your condition.  Will get help right away if you are not doing well or get worse.   This information is not intended to replace advice given to you by your health care provider. Make sure you discuss any questions you have with your health care provider.   Document Released: 08/23/2005 Document Revised: 08/28/2013 Document Reviewed: 01/26/2013 Elsevier Interactive Patient Education [employer].

## 2016-02-13 NOTE — ED Notes (Signed)
Pt given [place] sandwich and ginger ale per order from [place], [place].

## 2016-02-13 NOTE — ED Provider Notes (Signed)
Medical screening examination/treatment/procedure(s) were performed by non-physician practitioner and as supervising physician I was immediately available for consultation/collaboration.   EKG Interpretation None      ED ECG REPORT   Date: 02/13/2016  Rate: 67  Rhythm: normal sinus rhythm  QRS Axis: normal  Intervals: normal  ST/T Wave abnormalities: nonspecific ST/T changes  Conduction Disutrbances:none  Narrative Interpretation:   Old EKG Reviewed: unchanged  I have personally reviewed the EKG tracing and agree with the computerized printout as noted.   Fredia Sorrow, MD 02/13/16 2007

## 2016-02-13 NOTE — ED Provider Notes (Signed)
CSN: [id number]     Arrival date & time 02/13/16  1522 History   First MD Initiated Contact with Patient 02/13/16 1702     Chief Complaint  Patient presents with  . Chest Pain  . Near Syncope     (Consider location/radiation/quality/duration/timing/severity/associated sxs/prior Treatment) HPI   Blood pressure 127/99, pulse 65, temperature 98.1 F (36.7 C), resp. rate 19, height 5' 9"  (1.753 m), weight 108.863 kg, SpO2 95 %.  Dustin Hale is a 50 y.o. male with past mental history significant for non-insulin-dependent diabetes, complaint with his metformin complaining of right-sided chest pain, intermittently over the course of the last 2 weeks, described as 3 out of 10 like a slight punch in the chest lasting approximately 20 seconds, nonexertional, not exacerbated by cough or movement. Patient hot had an episode of lightheadedness yesterday while he was shopping for groceries, was associated with nausea, he also had an episode of the right-sided chest pain at that time. He had 2 episodes yesterday. Patient denies recent immobilizations, calf pain, leg swelling, history of DVT or PE, cough, hemoptysis, shortness of breath, fever, chills, melena, hematochezia. He has not had any chest pain today, he is eating and drinking normally, he denies polyuria and polydipsia, history also been slightly looser than normal. He states he is not lightheaded when he goes from sitting to standing.     Past Medical History  Diagnosis Date  . Obesity   . Diabetes mellitus without complication ([place])    History reviewed. No pertinent past surgical history. Family History  Problem Relation Age of Onset  . Hypertension Mother   . Hypertension Father   . Hypertension Maternal Grandmother   . Hypertension Maternal Grandfather   . Hypertension Paternal Grandmother    Social History  Substance Use Topics  . Smoking status: Passive Smoke Exposure - Never Smoker -- 0.33 packs/day for 32 years    Types:  Cigarettes  . Smokeless tobacco: Never Used     Comment: quit smoking on 08/27/14  . Alcohol Use: No     Comment: Used to drink heavily, quit in 2014    Review of Systems  10 systems reviewed and found to be negative, except as noted in the HPI.   Allergies  Review of patient's allergies indicates no known allergies.  Home Medications   Prior to Admission medications   Medication Sig Start Date End Date Taking? Authorizing Provider  acetaminophen (TYLENOL) 500 MG tablet Take 500-1,000 mg by mouth every 6 (six) hours as needed for mild pain or moderate pain.   Yes Historical Provider, MD  acetaminophen-codeine (TYLENOL #3) 300-30 MG tablet Take 1 tablet by mouth every 8 (eight) hours as needed for moderate pain. 10/27/15  Yes Boykin Nearing, MD  blood glucose meter kit and supplies KIT Dispense based on patient and insurance preference. Use up to four times daily as directed. (FOR ICD-9 250.00, 250.01). 09/22/14  Yes Ejiroghene E Emokpae, MD  Blood Glucose Monitoring Suppl (TRUE METRIX METER) W/DEVICE KIT 1 each by Does not apply route as needed. 06/20/15  Yes Josalyn Funches, MD  glucose blood (TRUE METRIX BLOOD GLUCOSE TEST) test strip 1 each by Other route 3 (three) times daily. 06/20/15  Yes Josalyn Funches, MD  insulin NPH-regular Human (NOVOLIN 70/30 RELION) (70-30) 100 UNIT/ML injection Inject 15 Units into the skin 2 (two) times daily with a meal. Patient taking differently: Inject 20 Units into the skin 2 (two) times daily with a meal.  06/20/15  Yes Josalyn Funches,  MD  Insulin Pen Needle 32G X 5 MM MISC 1 Units by Does not apply route daily. 09/22/14  Yes Ejiroghene Arlyce Dice, MD  metFORMIN (GLUCOPHAGE) 1000 MG tablet Take 1 tablet (1,000 mg total) by mouth 2 (two) times daily with a meal. 10/14/15  Yes Josalyn Funches, MD  TRUEPLUS LANCETS 28G MISC 1 each by Does not apply route 3 (three) times daily. 06/20/15  Yes Josalyn Funches, MD  cetirizine (ZYRTEC) 10 MG tablet Take 1  tablet (10 mg total) by mouth daily. Patient not taking: Reported on 02/13/2016 09/30/14   Lorayne Marek, MD  meloxicam (MOBIC) 15 MG tablet Take 1 tablet (15 mg total) by mouth daily. Patient not taking: Reported on 02/13/2016 10/27/15   Josalyn Funches, MD   BP 124/70 mmHg  Pulse 63  Temp(Src) 98.1 F (36.7 C)  Resp 17  Ht 5' 9"  (1.753 m)  Wt 108.863 kg  BMI 35.43 kg/m2  SpO2 95% Physical Exam  Constitutional: He is oriented to person, place, and time. He appears well-developed and well-nourished. No distress.  HENT:  Head: Normocephalic.  Mouth/Throat: Oropharynx is clear and moist.  Eyes: Conjunctivae are normal.  Neck: Normal range of motion. No JVD present. No tracheal deviation present.  Cardiovascular: Normal rate, regular rhythm and intact distal pulses.   Radial pulse equal bilaterally  Pulmonary/Chest: Effort normal and breath sounds normal. No stridor. No respiratory distress. He has no wheezes. He has no rales. He exhibits no tenderness.  Abdominal: Soft. He exhibits no distension and no mass. There is no tenderness. There is no rebound and no guarding.  Musculoskeletal: Normal range of motion. He exhibits no edema or tenderness.  No calf asymmetry, superficial collaterals, palpable cords, edema, Homans sign negative bilaterally.    Neurological: He is alert and oriented to person, place, and time.  Skin: Skin is warm. He is not diaphoretic.  Psychiatric: He has a normal mood and affect.  Nursing note and vitals reviewed.   ED Course  Procedures (including critical care time) Labs Review Labs Reviewed  BASIC METABOLIC PANEL - Abnormal; Notable for the following:    Potassium 3.4 (*)    Glucose, Bld 103 (*)    All other components within normal limits  CBG MONITORING, ED - Abnormal; Notable for the following:    Glucose-Capillary 111 (*)    All other components within normal limits  CBC  I-STAT TROPOININ, ED  Randolm Idol, ED    Imaging Review Dg Chest 2  View  02/13/2016  CLINICAL DATA:  Right-sided chest pain for 2 weeks. Shortness of breath. EXAM: CHEST  2 VIEW COMPARISON:  12/22/2014 FINDINGS: Cardiomediastinal silhouette is normal. Mediastinal contours appear intact. There is no evidence of focal airspace consolidation, pleural effusion or pneumothorax. Osseous structures are without acute abnormality. Stigmata of thoracic spine diffuse idiopathic skeletal hyperostosis is seen. Soft tissues are grossly normal. IMPRESSION: No active cardiopulmonary disease. Electronically Signed   By: Fidela Salisbury M.D.   On: 02/13/2016 16:08   I have personally reviewed and evaluated these images and lab results as part of my medical decision-making.   EKG Interpretation None      MDM   Final diagnoses:  Atypical chest pain  Near syncope    Filed Vitals:   02/13/16 1730 02/13/16 1815 02/13/16 1900 02/13/16 1930  BP: 142/89 128/92 131/71 124/70  Pulse: 65 63 63   Temp:      Resp: 16 17 20 17   Height:      Weight:  SpO2: 97% 100% 95%     Dustin Hale is 50 y.o. male presenting with Lightheaded sensation yesterday, now resolved. He also reports right-sided chest pain intermittently over the course of several weeks, this is very short, lasting approximately 20 seconds and resolving spontaneously.He has not had any episodes today. Physical exam is not consistent with DVT/PE. Patient is low risk by well's score and PERC negative. Symptoms are very inconsistent with ACS however triage initiated EKG without acute changes. Patient is not hyperglycemic, he has a normal anion gap, chest x-rays without abnormality, states he is not lightheaded when he stands and he is eating and drinking normally.  Delta troponin negative, patient remains asymptomatic, will follow with primary care next week.  Evaluation does not show pathology that would require ongoing emergent intervention or inpatient treatment. Pt is hemodynamically stable and mentating  appropriately. Discussed findings and plan with patient/guardian, who agrees with care plan. All questions answered. Return precautions discussed and outpatient follow up given.        Aroldo Galli, PA-C 02/13/16 2019

## 2016-02-13 NOTE — ED Notes (Signed)
Pt A&OX4, ambulatory at d/c with steady gait, NAD. Pt offered a second [place] sandwich and drink but patient politely declined. Pt has all of his belongings with him at discharge.

## 2016-02-13 NOTE — ED Notes (Signed)
Pt. Reports not feeling well for a few days, having periods of dizziness and lightheaded.  Rt. Side chest pressure intermittent for a few weeks.  He also has been taken off the insulin and the Doctor increased his Glucophage to 1000 mg a day.  Has not been feeling well,since then.  Pt. Stated that medication causes him to have loose stool and he cannot keep his food down.  Skin is warm and dry, Pt. Is alert and oriented X4

## 2016-09-18 ENCOUNTER — Encounter (HOSPITAL_COMMUNITY): Payer: Self-pay | Admitting: Emergency Medicine

## 2016-09-18 ENCOUNTER — Emergency Department (HOSPITAL_COMMUNITY)
Admission: EM | Admit: 2016-09-18 | Discharge: 2016-09-18 | Disposition: A | Discharge status: Home / Self Care | Payer: Self-pay | Attending: Emergency Medicine | Admitting: Emergency Medicine

## 2016-09-18 DIAGNOSIS — M7711 Lateral epicondylitis, right elbow: Secondary | ICD-10-CM | POA: Insufficient documentation

## 2016-09-18 DIAGNOSIS — Z79899 Other long term (current) drug therapy: Secondary | ICD-10-CM | POA: Insufficient documentation

## 2016-09-18 DIAGNOSIS — E119 Type 2 diabetes mellitus without complications: Secondary | ICD-10-CM | POA: Insufficient documentation

## 2016-09-18 DIAGNOSIS — Z7722 Contact with and (suspected) exposure to environmental tobacco smoke (acute) (chronic): Secondary | ICD-10-CM | POA: Insufficient documentation

## 2016-09-18 DIAGNOSIS — Z794 Long term (current) use of insulin: Secondary | ICD-10-CM | POA: Insufficient documentation

## 2016-09-18 MED ORDER — MELOXICAM 15 MG PO TABS
15.0000 mg | ORAL_TABLET | Freq: Every day | ORAL | 0 refills | Status: DC
Start: 2016-09-18 — End: 2018-09-26

## 2016-09-18 NOTE — Discharge Instructions (Signed)
Obtain a counter force brace which is over the counter Take Meloxicam once daily for pain and inflammation Follow up with orthopedics

## 2016-09-18 NOTE — ED Triage Notes (Signed)
Pt. Stated, I've had elbow and arm pain for over 6 months. Its just gone on too long.

## 2016-09-18 NOTE — ED Provider Notes (Signed)
[facility] DEPT Provider Note   CSN: [id number] Arrival date & time: 09/18/16  1112  By signing my name below, I, Dustin Hale, attest that this documentation has been prepared under the direction and in the presence of Dustin Hora, PA-C.  Electronically Signed: Reola Hale, ED Scribe. 09/18/16. 11:50 AM.  History   Chief Complaint Chief Complaint  Patient presents with  . Elbow Pain   The history is provided by the patient. No language interpreter was used.    HPI Comments: Dustin Hale is a right-hand dominant 51 y.o. male with a PMHx of obesity and DM, who presents to the Emergency Department complaining of persistent right elbow pain onset approximately 6 months ago. He describes his pain as throbbing. No recent trauma, injury, or falls to precipitate his pain. He has been taking Asprin and intermittently wrapping the elbow in compression bandages since the onset of his pain without significant relief. Pt's pain is exacerbated with twisting and rotating his arm. His pain is mildly alleviated with keeping his elbow bent. Pt notes that he works at a [employer], and he has to repetitively work with his hands and with typing. He denies focal numbness/weakness, or any other associated symptoms.   Past Medical History:  Diagnosis Date  . Diabetes mellitus without complication ([place])   . Obesity    Patient Active Problem List   Diagnosis Date Noted  . Degenerative joint disease, shoulder, left 10/27/2015  . Farsightedness 06/20/2015  . Diabetes type 2, controlled ([place]) 06/20/2015  . Radicular pain in left arm 06/20/2015  . Current smoker 06/20/2015   History reviewed. No pertinent surgical history.  Home Medications    Prior to Admission medications   Medication Sig Start Date End Date Taking? Authorizing Provider  acetaminophen (TYLENOL) 500 MG tablet Take 500-1,000 mg by mouth every 6 (six) hours as needed for mild pain or moderate pain.    Historical Provider,  MD  acetaminophen-codeine (TYLENOL #3) 300-30 MG tablet Take 1 tablet by mouth every 8 (eight) hours as needed for moderate pain. 10/27/15   Boykin Nearing, MD  blood glucose meter kit and supplies KIT Dispense based on patient and insurance preference. Use up to four times daily as directed. (FOR ICD-9 250.00, 250.01). 09/22/14   Ejiroghene Arlyce Dice, MD  Blood Glucose Monitoring Suppl (TRUE METRIX METER) W/DEVICE KIT 1 each by Does not apply route as needed. 06/20/15   Josalyn Funches, MD  cetirizine (ZYRTEC) 10 MG tablet Take 1 tablet (10 mg total) by mouth daily. Patient not taking: Reported on 02/13/2016 09/30/14   Lorayne Marek, MD  glucose blood (TRUE METRIX BLOOD GLUCOSE TEST) test strip 1 each by Other route 3 (three) times daily. 06/20/15   Josalyn Funches, MD  insulin NPH-regular Human (NOVOLIN 70/30 RELION) (70-30) 100 UNIT/ML injection Inject 15 Units into the skin 2 (two) times daily with a meal. Patient taking differently: Inject 20 Units into the skin 2 (two) times daily with a meal.  06/20/15   Josalyn Funches, MD  Insulin Pen Needle 32G X 5 MM MISC 1 Units by Does not apply route daily. 09/22/14   Ejiroghene Arlyce Dice, MD  meloxicam (MOBIC) 15 MG tablet Take 1 tablet (15 mg total) by mouth daily. Patient not taking: Reported on 02/13/2016 10/27/15   Boykin Nearing, MD  metFORMIN (GLUCOPHAGE) 1000 MG tablet Take 1 tablet (1,000 mg total) by mouth 2 (two) times daily with a meal. 10/14/15   Boykin Nearing, MD  TRUEPLUS LANCETS 28G  MISC 1 each by Does not apply route 3 (three) times daily. 06/20/15   Boykin Nearing, MD   Family History Family History  Problem Relation Age of Onset  . Hypertension Mother   . Hypertension Father   . Hypertension Maternal Grandmother   . Hypertension Maternal Grandfather   . Hypertension Paternal Grandmother    Social History Social History  Substance Use Topics  . Smoking status: Passive Smoke Exposure - Never Smoker    Packs/day: 0.33    Years:  32.00    Types: Cigarettes  . Smokeless tobacco: Never Used     Comment: quit smoking on 08/27/14  . Alcohol use No     Comment: Used to drink heavily, quit in 2014   Allergies   Patient has no known allergies.  Review of Systems Review of Systems  Musculoskeletal: Positive for arthralgias (right elbow).  Neurological: Negative for weakness and numbness.   Physical Exam Updated Vital Signs BP 127/89 (BP Location: Left Arm)   Pulse 72   Temp 98.3 F (36.8 C) (Oral)   Resp 20   SpO2 98%   Physical Exam  Constitutional: He appears well-developed and well-nourished. No distress.  HENT:  Head: Normocephalic and atraumatic.  Eyes: Conjunctivae are normal.  Neck: Normal range of motion.  Cardiovascular: Normal rate.   Pulmonary/Chest: Effort normal.  Abdominal: He exhibits no distension.  Musculoskeletal: He exhibits tenderness. He exhibits no edema or deformity.  Right arm: No obvious, swelling, deformity, or redness. He has full ROM of the elbow. Tenderness over the lateral aspect of the elbow. Pain with full flexion and extension of the elbow. He is neurovascularly intact. No tenderness orf the shoulder, wrist, or hand.   Neurological: He is alert.  Skin: No pallor.  Psychiatric: He has a normal mood and affect. His behavior is normal.  Nursing note and vitals reviewed.  ED Treatments / Results  DIAGNOSTIC STUDIES: Oxygen Saturation is 98% on RA, normal by my interpretation.   COORDINATION OF CARE: 11:50 AM-Discussed next steps with pt. Pt verbalized understanding and is agreeable with the plan.   Labs (all labs ordered are listed, but only abnormal results are displayed) Labs Reviewed - No data to display  EKG  EKG Interpretation None      Radiology No results found.  Procedures Procedures   Medications Ordered in ED Medications - No data to display  Initial Impression / Assessment and Plan / ED Course  I have reviewed the triage vital signs and the  nursing notes.  Pertinent labs & imaging results that were available during my care of the patient were reviewed by me and considered in my medical decision making (see chart for details).  Clinical Course    51 year old male presents with symptoms consistent with lateral epicondylitis. Advised counter force brace and NSAIDs. Ortho follow up given. Patient is NAD, non-toxic, with stable VS. Patient is informed of clinical course, understands medical decision making process, and agrees with plan. Opportunity for questions provided and all questions answered. Return precautions given.   Final Clinical Impressions(s) / ED Diagnoses   Final diagnoses:  Lateral epicondylitis of right elbow   New Prescriptions Discharge Medication List as of 09/18/2016 11:58 AM     I personally performed the services described in this documentation, which was scribed in my presence. The recorded information has been reviewed and is accurate.    Recardo Evangelist, PA-C 09/19/16 1241    Carmin Muskrat, MD 09/19/16 [phone]

## 2016-09-18 NOTE — ED Notes (Signed)
Declined W/C at D/C and was escorted to lobby by RN. 

## 2016-09-21 MED FILL — MELOXICAM 15 MG TABLET: 15 | 30 days supply | Qty: 30 | Fill #0

## 2016-09-30 ENCOUNTER — Ambulatory Visit: Payer: Self-pay | Attending: Family Medicine | Admitting: Family Medicine

## 2016-09-30 ENCOUNTER — Encounter: Payer: Self-pay | Admitting: Family Medicine

## 2016-09-30 VITALS — BP 121/75 | HR 61 | Temp 98.4°F | Wt 223.2 lb

## 2016-09-30 DIAGNOSIS — E119 Type 2 diabetes mellitus without complications: Secondary | ICD-10-CM | POA: Insufficient documentation

## 2016-09-30 DIAGNOSIS — F489 Nonpsychotic mental disorder, unspecified: Secondary | ICD-10-CM

## 2016-09-30 DIAGNOSIS — Z7722 Contact with and (suspected) exposure to environmental tobacco smoke (acute) (chronic): Secondary | ICD-10-CM | POA: Insufficient documentation

## 2016-09-30 DIAGNOSIS — F329 Major depressive disorder, single episode, unspecified: Secondary | ICD-10-CM | POA: Insufficient documentation

## 2016-09-30 DIAGNOSIS — Z114 Encounter for screening for human immunodeficiency virus [HIV]: Secondary | ICD-10-CM | POA: Insufficient documentation

## 2016-09-30 DIAGNOSIS — R45851 Suicidal ideations: Secondary | ICD-10-CM | POA: Insufficient documentation

## 2016-09-30 DIAGNOSIS — M7711 Lateral epicondylitis, right elbow: Secondary | ICD-10-CM | POA: Insufficient documentation

## 2016-09-30 DIAGNOSIS — Z79899 Other long term (current) drug therapy: Secondary | ICD-10-CM | POA: Insufficient documentation

## 2016-09-30 DIAGNOSIS — Z794 Long term (current) use of insulin: Secondary | ICD-10-CM | POA: Insufficient documentation

## 2016-09-30 LAB — COMPLETE METABOLIC PANEL WITH GFR
ALBUMIN: 4 g/dL (ref 3.6–5.1)
ALK PHOS: 98 U/L (ref 40–115)
ALT: 29 U/L (ref 9–46)
AST: 22 U/L (ref 10–40)
BUN: 18 mg/dL (ref 7–25)
CALCIUM: 9 mg/dL (ref 8.6–10.3)
CO2: 29 mmol/L (ref 20–31)
CREATININE: 0.86 mg/dL (ref 0.60–1.35)
Chloride: 108 mmol/L (ref 98–110)
GFR, Est African American: >89 mL/min (ref >=60)
GFR, Est Non African American: >89 mL/min (ref >=60)
Glucose, Bld: 88 mg/dL (ref 65–99)
Potassium: 3.7 mmol/L (ref 3.5–5.3)
Sodium: 141 mmol/L (ref 135–146)
TOTAL PROTEIN: 6.7 g/dL (ref 6.1–8.1)
Total Bilirubin: 0.3 mg/dL (ref 0.2–1.2)

## 2016-09-30 LAB — POCT GLYCOSYLATED HEMOGLOBIN (HGB A1C): Hemoglobin A1C: 5.7

## 2016-09-30 LAB — GLUCOSE, POCT (MANUAL RESULT ENTRY): POC Glucose: 96 mg/dl (ref 70–99)

## 2016-09-30 NOTE — Patient Instructions (Addendum)
Dustin Hale was seen today for follow-up.  Diagnoses and all orders for this visit:  Controlled type 2 diabetes mellitus without complication, with long-term current use of insulin (HCC) -     POCT glucose (manual entry) -     POCT glycosylated hemoglobin (Hb A1C) -     COMPLETE METABOLIC PANEL WITH GFR -     Microalbumin/Creatinine Ratio, Urine  Right lateral epicondylitis -     Ambulatory referral to Orthopedic Surgery   F/u a  Dr. Adrian Blackwater

## 2016-09-30 NOTE — Progress Notes (Signed)
Subjective:  Patient ID: Dustin Hale, male    DOB: [birth year]  Age: 51 y.o. MRN: [id number]  CC: Follow-up   HPI Dustin Hale has diabetes, he is uninsured he presents to re-establish care, he reports he was lost to follow up due to recent incarceration     1. ED f/u right lateral epicondylitis: he was seen in [facility] ED on 09/18/2016 for right elbow pain for about 6 months. He reports history of pinched nerve in his neck. He has lateral elbow pain to forearm that is exacerbated by lifting with his R hand. He is R handed. He denies injury and overuse.   2. Depression with suicidal thought: he has established care at [facility] as of 09/27/2016. Reports headaches, anxiety, PTSD. Cannot recall medications he has been prescribed. Reports symptoms are related to his [place] service from [phone]. He request that I call [facility] [phone] ext [phone].   3. Diabetes: he is not taking metformin or insulin for past 11 months.   Social History  Substance Use Topics  . Smoking status: Passive Smoke Exposure - Never Smoker    Packs/day: 0.33    Years: 32.00    Types: Cigarettes  . Smokeless tobacco: Never Used     Comment: quit smoking on 08/27/14  . Alcohol use No     Comment: Used to drink heavily, quit in 2014    Outpatient Medications Prior to Visit  Medication Sig Dispense Refill  . acetaminophen (TYLENOL) 500 MG tablet Take 500-1,000 mg by mouth every 6 (six) hours as needed for mild pain or moderate pain.    Marland Kitchen acetaminophen-codeine (TYLENOL #3) 300-30 MG tablet Take 1 tablet by mouth every 8 (eight) hours as needed for moderate pain. 60 tablet 0  . blood glucose meter kit and supplies KIT Dispense based on patient and insurance preference. Use up to four times daily as directed. (FOR ICD-9 250.00, 250.01). 1 each 2  . Blood Glucose Monitoring Suppl (TRUE METRIX METER) W/DEVICE KIT 1 each by Does not apply route as needed. 1 kit 0  . glucose blood (TRUE METRIX BLOOD GLUCOSE TEST) test  strip 1 each by Other route 3 (three) times daily. 100 each 11  . insulin NPH-regular Human (NOVOLIN 70/30 RELION) (70-30) 100 UNIT/ML injection Inject 15 Units into the skin 2 (two) times daily with a meal. (Patient taking differently: Inject 20 Units into the skin 2 (two) times daily with a meal. ) 40 mL 3  . Insulin Pen Needle 32G X 5 MM MISC 1 Units by Does not apply route daily. 50 each 1  . meloxicam (MOBIC) 15 MG tablet Take 1 tablet (15 mg total) by mouth daily. 30 tablet 0  . metFORMIN (GLUCOPHAGE) 1000 MG tablet Take 1 tablet (1,000 mg total) by mouth 2 (two) times daily with a meal. 60 tablet 0  . TRUEPLUS LANCETS 28G MISC 1 each by Does not apply route 3 (three) times daily. 100 each 11   No facility-administered medications prior to visit.     ROS Review of Systems  Constitutional: Negative for chills, fatigue, fever and unexpected weight change.  Eyes: Negative for visual disturbance.  Respiratory: Negative for cough and shortness of breath.   Cardiovascular: Negative for chest pain, palpitations and leg swelling.  Gastrointestinal: Negative for abdominal pain, blood in stool, constipation, diarrhea, nausea and vomiting.  Endocrine: Negative for polydipsia, polyphagia and polyuria.  Musculoskeletal: Positive for arthralgias. Negative for back pain, gait problem, myalgias and neck pain.  Skin: Negative for rash.  Allergic/Immunologic: Negative for immunocompromised state.  Hematological: Negative for adenopathy. Does not bruise/bleed easily.  Psychiatric/Behavioral: Positive for dysphoric mood. Negative for sleep disturbance and suicidal ideas. The patient is not nervous/anxious.     Objective:  BP 121/75 (BP Location: Left Arm, Patient Position: Sitting, Cuff Size: Small)   Pulse 61   Temp 98.4 F (36.9 C) (Oral)   Wt 223 lb 3.2 oz (101.2 kg)   SpO2 97%   BMI 32.96 kg/m   BP/Weight 09/30/2016 8/47/2072 09/13/2881  Systolic BP 374 451 460  Diastolic BP 75 89 70  Wt.  (Lbs) 223.2 - 240  BMI 32.96 - 35.43     Physical Exam  Constitutional: He appears well-developed and well-nourished. No distress.  HENT:  Head: Normocephalic and atraumatic.  Neck: Normal range of motion. Neck supple.  Cardiovascular: Normal rate, regular rhythm, normal heart sounds and intact distal pulses.   Pulmonary/Chest: Effort normal and breath sounds normal.  Musculoskeletal: He exhibits no edema.  Neurological: He is alert.  Skin: Skin is warm and dry. No rash noted. No erythema.  Psychiatric: He has a normal mood and affect.    Lab Results  Component Value Date   HGBA1C 5.70 10/27/2015   CBG 96  Assessment & Plan:   Vincen was seen today for follow-up.  Diagnoses and all orders for this visit:  Controlled type 2 diabetes mellitus without complication, with long-term current use of insulin (HCC) -     POCT glucose (manual entry) -     POCT glycosylated hemoglobin (Hb A1C) -     COMPLETE METABOLIC PANEL WITH GFR -     Microalbumin/Creatinine Ratio, Urine  Right lateral epicondylitis -     Ambulatory referral to Orthopedic Surgery  Screening for HIV (human immunodeficiency virus) -     HIV antibody (with reflex)    No orders of the defined types were placed in this encounter.   Follow-up: No Follow-up on file.   Dustin Nearing MD

## 2016-10-01 ENCOUNTER — Telehealth: Payer: Self-pay

## 2016-10-01 DIAGNOSIS — F489 Nonpsychotic mental disorder, unspecified: Secondary | ICD-10-CM | POA: Insufficient documentation

## 2016-10-01 DIAGNOSIS — M7711 Lateral epicondylitis, right elbow: Secondary | ICD-10-CM | POA: Insufficient documentation

## 2016-10-01 LAB — MICROALBUMIN / CREATININE URINE RATIO
Creatinine, Urine: 358 mg/dL (ref 20–370)
Microalb Creat Ratio: 3 mcg/mg creat (ref <30)
Microalb, Ur: 0.9 mg/dL

## 2016-10-01 LAB — HIV ANTIBODY (ROUTINE TESTING W REFLEX): HIV 1&2 Ab, 4th Generation: NONREACTIVE

## 2016-10-01 NOTE — Assessment & Plan Note (Signed)
Advised continue use of NSAID Patient advised to renew orange card Ortho referral placed

## 2016-10-01 NOTE — Telephone Encounter (Signed)
Contacted pt to go over lab results pt is aware of results and doesn't have any questions or concerns 

## 2016-10-01 NOTE — Assessment & Plan Note (Signed)
Patient signed release for records He is to continue care with [facility] He is to ask [facility] provider to send medications to on site pharmacy

## 2016-10-01 NOTE — Assessment & Plan Note (Signed)
Controlled No need for treatment

## 2016-10-13 ENCOUNTER — Ambulatory Visit: Payer: Self-pay | Attending: Family Medicine

## 2016-12-23 ENCOUNTER — Emergency Department (HOSPITAL_COMMUNITY): Payer: Self-pay

## 2016-12-23 ENCOUNTER — Emergency Department (HOSPITAL_COMMUNITY)
Admission: EM | Admit: 2016-12-23 | Discharge: 2016-12-23 | Discharge status: Against Medical Advice | Payer: Self-pay | Attending: Emergency Medicine | Admitting: Emergency Medicine

## 2016-12-23 DIAGNOSIS — Y929 Unspecified place or not applicable: Secondary | ICD-10-CM | POA: Insufficient documentation

## 2016-12-23 DIAGNOSIS — Y999 Unspecified external cause status: Secondary | ICD-10-CM | POA: Insufficient documentation

## 2016-12-23 DIAGNOSIS — W19XXXA Unspecified fall, initial encounter: Secondary | ICD-10-CM

## 2016-12-23 DIAGNOSIS — S0081XA Abrasion of other part of head, initial encounter: Secondary | ICD-10-CM | POA: Insufficient documentation

## 2016-12-23 DIAGNOSIS — S0990XA Unspecified injury of head, initial encounter: Secondary | ICD-10-CM

## 2016-12-23 DIAGNOSIS — S0012XA Contusion of left eyelid and periocular area, initial encounter: Secondary | ICD-10-CM | POA: Insufficient documentation

## 2016-12-23 DIAGNOSIS — Z7722 Contact with and (suspected) exposure to environmental tobacco smoke (acute) (chronic): Secondary | ICD-10-CM | POA: Insufficient documentation

## 2016-12-23 DIAGNOSIS — E119 Type 2 diabetes mellitus without complications: Secondary | ICD-10-CM | POA: Insufficient documentation

## 2016-12-23 DIAGNOSIS — W228XXA Striking against or struck by other objects, initial encounter: Secondary | ICD-10-CM | POA: Insufficient documentation

## 2016-12-23 DIAGNOSIS — Y939 Activity, unspecified: Secondary | ICD-10-CM | POA: Insufficient documentation

## 2016-12-23 MED ORDER — TETANUS-DIPHTH-ACELL PERTUSSIS 5-2.5-18.5 LF-MCG/0.5 IM SUSP: 0.5000 mL | Freq: Once | INTRAMUSCULAR | Status: DC

## 2016-12-23 NOTE — ED Notes (Signed)
Patient standing in hall across from bed, when asked to have a seat in chair or hall bed, pt states "Im OK!". Patient also asked if he could go. Writer encouraged patient to wait and let a doctor look at his wound, as he may need some stiches. Patient turned his head away from writer and did not respond.

## 2016-12-23 NOTE — ED Provider Notes (Signed)
[facility] DEPT Provider Note   CSN: [id number] Arrival date & time: 12/23/16  2217     History   Chief Complaint Chief Complaint  Patient presents with  . Fall  . Head Laceration    HPI Dustin Hale is a 51 y.o. male.  Patient presents emergency department with chief complaint of fall. He is intoxicated, and states that he lost his balance and fell striking his head on the ground. He denies loss of consciousness. He states that "I'm okay, can I go." He denies any other associated symptoms. There are no modifying factors. He denies any numbness, weakness, or tingling. Denies any pain in his neck or back.   The history is provided by the patient. No language interpreter was used.    Past Medical History:  Diagnosis Date  . Diabetes mellitus without complication ([place])   . Obesity     Patient Active Problem List   Diagnosis Date Noted  . Right lateral epicondylitis 10/01/2016  . Mental health problem 10/01/2016  . Degenerative joint disease, shoulder, left 10/27/2015  . Farsightedness 06/20/2015  . Diabetes type 2, controlled ([place]) 06/20/2015  . Radicular pain in left arm 06/20/2015  . Current smoker 06/20/2015    No past surgical history on file.     Home Medications    Prior to Admission medications   Medication Sig Start Date End Date Taking? Authorizing Provider  acetaminophen (TYLENOL) 500 MG tablet Take 500-1,000 mg by mouth every 6 (six) hours as needed for mild pain or moderate pain.    Historical Provider, MD  acetaminophen-codeine (TYLENOL #3) 300-30 MG tablet Take 1 tablet by mouth every 8 (eight) hours as needed for moderate pain. 10/27/15   Boykin Nearing, MD  blood glucose meter kit and supplies KIT Dispense based on patient and insurance preference. Use up to four times daily as directed. (FOR ICD-9 250.00, 250.01). 09/22/14   Ejiroghene Arlyce Dice, MD  Blood Glucose Monitoring Suppl (TRUE METRIX METER) W/DEVICE KIT 1 each by Does not apply route as  needed. 06/20/15   Josalyn Funches, MD  glucose blood (TRUE METRIX BLOOD GLUCOSE TEST) test strip 1 each by Other route 3 (three) times daily. 06/20/15   Josalyn Funches, MD  meloxicam (MOBIC) 15 MG tablet Take 1 tablet (15 mg total) by mouth daily. 09/18/16   Recardo Evangelist, PA-C  TRUEPLUS LANCETS 28G MISC 1 each by Does not apply route 3 (three) times daily. 06/20/15   Boykin Nearing, MD    Family History Family History  Problem Relation Age of Onset  . Hypertension Mother   . Hypertension Father   . Hypertension Maternal Grandmother   . Hypertension Maternal Grandfather   . Hypertension Paternal Grandmother     Social History Social History  Substance Use Topics  . Smoking status: Passive Smoke Exposure - Never Smoker    Packs/day: 0.33    Years: 32.00    Types: Cigarettes  . Smokeless tobacco: Never Used     Comment: quit smoking on 08/27/14  . Alcohol use No     Comment: Used to drink heavily, quit in 2014     Allergies   Patient has no known allergies.   Review of Systems Review of Systems  All other systems reviewed and are negative.    Physical Exam Updated Vital Signs BP 127/90 (BP Location: Right Arm)   Pulse 78   Temp 97.7 F (36.5 C) (Oral)   Resp 20   SpO2 99%   Physical Exam  Constitutional: He is oriented to person, place, and time. He appears well-developed and well-nourished.  HENT:  Head: Normocephalic and atraumatic.  Left superior periorbital hematoma, no laceration, abrasion to left forehead, no foreign body, no evidence of entrapment  Eyes: Conjunctivae and EOM are normal. Pupils are equal, round, and reactive to light. Right eye exhibits no discharge. Left eye exhibits no discharge. No scleral icterus.  Neck: Normal range of motion. Neck supple. No JVD present.  Cardiovascular: Normal rate, regular rhythm and normal heart sounds.  Exam reveals no gallop and no friction rub.   No murmur heard. Pulmonary/Chest: Effort normal and breath  sounds normal. No respiratory distress. He has no wheezes. He has no rales. He exhibits no tenderness.  Abdominal: Soft. He exhibits no distension and no mass. There is no tenderness. There is no rebound and no guarding.  Musculoskeletal: Normal range of motion. He exhibits no edema or tenderness.  Neurological: He is alert and oriented to person, place, and time.  Ambulates with unsteady gait  Skin: Skin is warm and dry.  Psychiatric: He has a normal mood and affect. His behavior is normal. Judgment and thought content normal.  Nursing note and vitals reviewed.    ED Treatments / Results  Labs (all labs ordered are listed, but only abnormal results are displayed) Labs Reviewed - No data to display  EKG  EKG Interpretation None       Radiology No results found.  Procedures Procedures (including critical care time)  Medications Ordered in ED Medications  Tdap (BOOSTRIX) injection 0.5 mL (not administered)     Initial Impression / Assessment and Plan / ED Course  I have reviewed the triage vital signs and the nursing notes.  Pertinent labs & imaging results that were available during my care of the patient were reviewed by me and considered in my medical decision making (see chart for details).     Patient is intoxicated, but responds to my questions appropriately. He states that he does not care to be evaluated, and would like to be discharged. Sustained a presumably mechanical fall earlier this evening. He has a large hematoma over his left eye with an abrasion to his forehead. He has no evidence of entrapment. He is grossly neurologically intact, and ambulates with a relatively steady gait. I have encouraged the patient that he is to have CT imaging of his head. He agrees to stay for this.  11:04 PM RN states patient is requesting to leave.  Will see if he can be redirected by security.  11:21 PM Patient eloped without my knowledge. I was not notified of the patient's  departure. Patient did not appear to be a danger to himself. He did appear to be intoxicated, but his thought process was reasonable during initial exam.    Final Clinical Impressions(s) / ED Diagnoses   Final diagnoses:  Fall, initial encounter  Injury of head, initial encounter    New Prescriptions New Prescriptions   No medications on file     Montine Circle, PA-C 12/23/16 [place], MD 12/24/16 [phone]

## 2016-12-23 NOTE — ED Notes (Signed)
Patient left ama after multiple attempts to get him to stay for full evaluation.

## 2016-12-23 NOTE — ED Notes (Signed)
Pt not answering questions about what happened or why he fell.

## 2016-12-23 NOTE — ED Notes (Signed)
Per [facility]- Pt found laying in street by [facility]. Denies LOC, large hematoma over left eye with small laceration. Pupils equal and reactive. Admits to ETOH usage.

## 2016-12-24 ENCOUNTER — Ambulatory Visit (HOSPITAL_COMMUNITY): Admission: RE | Admit: 2016-12-24 | Payer: Self-pay | Source: Ambulatory Visit

## 2017-01-19 ENCOUNTER — Encounter: Payer: Self-pay | Admitting: Family Medicine

## 2018-06-07 ENCOUNTER — Encounter: Payer: Self-pay | Admitting: Family Medicine

## 2018-06-07 ENCOUNTER — Ambulatory Visit: Payer: Self-pay | Attending: Family Medicine | Admitting: Family Medicine

## 2018-06-07 VITALS — BP 116/66 | HR 62 | Temp 98.2°F | Resp 18 | Ht 69.0 in | Wt 226.0 lb

## 2018-06-07 DIAGNOSIS — L84 Corns and callosities: Secondary | ICD-10-CM | POA: Insufficient documentation

## 2018-06-07 DIAGNOSIS — E119 Type 2 diabetes mellitus without complications: Secondary | ICD-10-CM

## 2018-06-07 DIAGNOSIS — M545 Low back pain, unspecified: Secondary | ICD-10-CM

## 2018-06-07 DIAGNOSIS — G8929 Other chronic pain: Secondary | ICD-10-CM

## 2018-06-07 DIAGNOSIS — Z1211 Encounter for screening for malignant neoplasm of colon: Secondary | ICD-10-CM

## 2018-06-07 DIAGNOSIS — F17210 Nicotine dependence, cigarettes, uncomplicated: Secondary | ICD-10-CM | POA: Insufficient documentation

## 2018-06-07 DIAGNOSIS — Z8249 Family history of ischemic heart disease and other diseases of the circulatory system: Secondary | ICD-10-CM | POA: Insufficient documentation

## 2018-06-07 LAB — POCT GLYCOSYLATED HEMOGLOBIN (HGB A1C): HbA1c, POC (prediabetic range): 5.7 % (ref 5.7–6.4)

## 2018-06-07 LAB — GLUCOSE, POCT (MANUAL RESULT ENTRY): POC Glucose: 107 mg/dL — AB (ref 70–99)

## 2018-06-07 NOTE — Progress Notes (Signed)
Subjective:    Patient ID: Dustin Hale, male    DOB: 06-02-1966, 52 y.o.   MRN: [id number]  HPI 52 yo male who was last seen in this clinic on 09/30/2016 in follow-up of diabetes. [name] Hgb A1c at that time was 5.7. Patient is seen today to re-establish care.  Patient reports that he is not currently taking any medications.  Patient states that he watches what he eats his exercises in order to control his diabetes.  Patient is not monitoring his home blood sugars.  Patient reports that in addition to diabetes, he has a past history of being told that he had a pinched nerve in his neck which causes some recurrent numbness and tingling in his fingertips.  Patient states that since he is now 62, patient would like to be referred for colonoscopy.  Patient does not have any family history of colon cancer.  Patient states that overall he feels well today.  Patient states that over the past few months he has had some recurrent pain in his mid back.  Patient describes the pain is a dull ache without radiation.  Patient denies any urinary frequency, no dysuria and no nocturia.  Patient has had no injury to his back.      Patient reports family history significant for mother and maternal grandmother having hypertension.  Family history is negative for cancer, heart disease and stroke in the immediate family.  Patient reports no past surgeries.  Patient does continue to smoke and smokes approximately a fourth a pack per day.  Patient would like to stop smoking but is not sure that he can.  Patient also drinks about a pint of vodka on the weekends.  Patient is single and currently unemployed    Review of Systems  Constitutional: Negative for chills, fatigue and fever.  Respiratory: Negative for cough and shortness of breath.   Cardiovascular: Negative for chest pain, palpitations and leg swelling.  Gastrointestinal: Negative for abdominal pain, blood in stool and constipation.  Endocrine: Negative for  polydipsia, polyphagia and polyuria.  Genitourinary: Negative for dysuria and frequency.  Musculoskeletal: Positive for back pain. Negative for gait problem and joint swelling.  Neurological: Positive for numbness (occasionally in the fingertips). Negative for dizziness and headaches.       Objective:   Physical Exam BP 116/66 (BP Location: Left Arm, Patient Position: Sitting, Cuff Size: Normal)   Pulse 62   Temp 98.2 F (36.8 C) (Oral)   Resp 18   Ht 5\' 9"  (1.753 m)   Wt 226 lb (102.5 kg)   SpO2 97%   BMI 33.37 kg/m  Nurse's notes and vital signs reviewed General-well-nourished, well-developed large framed, older male in no acute distress ENT- TMs dull, nares with moderate edema of the nasal turbinates with gray to light yellow nasal discharge, patient with mild posterior pharynx/tonsillar arch erythema and a narrowed posterior airway secondary to body habitus and a large tongue base Neck-supple, no lymphadenopathy, no thyromegaly, no carotid bruit Cardiovascular-regular rate and rhythm Lungs-clear to auscultation bilaterally Abdomen-patient with truncal obesity, abdomen is soft and nontender Back-patient with complaint of bilateral discomfort with testing for CVA tenderness and patient with some thoracolumbar paraspinous spasm Extremities-no edema Diabetic foot exam- patient with some mild hammertoe deformities.  Patient with a large callus on the medial portion of the right great toe.  Patient with 1+ dorsalis pedis and posterior tibial pulses on the right and the left.  Patient with normal monofilament exam in all 10 areas  tested on the right foot.  On the left foot, patient with absent monofilament sensation on the ball of the foot below the base of the left great toe.  Patient does have some thickened, slightly callused skin in this area.  Patient with good nail care and no nail discoloration or thickening.     Assessment & Plan:  1. Type 2 diabetes mellitus without complication,  without long-term current use of insulin (HCC) [name] hemoglobin A1c at today's visit was 5.7 indicating continued control of his diabetes without the use of medication.  Patient also with fingerstick blood sugar of 107.  At this time, patient may remain off of medication for the treatment of diabetes.  Patient will return for fasting lipid panel, CMP and microalbumin.  Patient promises to return to clinic tomorrow for labs.  Diabetic foot care discussed as patient did have some calluses in an area of abnormal sensation on the left foot.  Patient was aware that based on his lipid panel results and the fact that he is diabetic, low-dose lipid therapy may be recommended and if microalbumin is abnormal then patient may benefit from ACE inhibitor therapy to help to protect the kidneys from the effects of diabetes.  Patient also encouraged to have yearly diabetic eye exam.  Continue low carbohydrate diet and exercise to help control diabetes/blood sugars - HgB A1c - Glucose (CBG) - Lipid Panel; Future - Comprehensive metabolic panel; Future - Microalbumin/Creatinine Ratio, Urine; Future  2. Screening for colon cancer Patient is now 52 years old and he request referral to have a screening colonoscopy.  Referral will be placed. - Ambulatory referral to Gastroenterology  3. Chronic midline low back pain without sciatica When patient returns for fasting labs, patient will also have urinalysis to look for possible urinary tract infection as a contributing factor to his low back pain.  Patient is encouraged to use over-the-counter pain medications as needed as well as warm moist heat to the affected areas.  Patient may need imaging and possible physical therapy if back pain continues.  Patient denies any urinary symptoms such as nocturia, weakening of the urinary stream, difficulty initiating the urinary stream or incomplete bladder emptying which would indicate the need for prostate exam at this time.  Will  discuss possible PSA as well as prostate exam if continued back pain or development of urinary symptoms. - Urinalysis; Future  *Patient was offered influenza immunization which he declined at today's visit  An After Visit Summary was printed and given to the patient.  Return in about 6 months (around 12/07/2018) for -fasting labs soon.

## 2018-06-08 ENCOUNTER — Other Ambulatory Visit: Payer: Self-pay

## 2018-06-09 ENCOUNTER — Ambulatory Visit: Payer: Self-pay | Attending: Family Medicine

## 2018-06-09 DIAGNOSIS — M545 Low back pain: Secondary | ICD-10-CM | POA: Insufficient documentation

## 2018-06-09 DIAGNOSIS — E119 Type 2 diabetes mellitus without complications: Secondary | ICD-10-CM

## 2018-06-09 DIAGNOSIS — G8929 Other chronic pain: Secondary | ICD-10-CM | POA: Insufficient documentation

## 2018-06-09 NOTE — Progress Notes (Signed)
Patient here for lab visit only 

## 2018-06-09 NOTE — Addendum Note (Signed)
Addended by: Octaviano Glow on: 06/09/2018 09:14 AM   Modules accepted: Orders

## 2018-06-10 LAB — COMPREHENSIVE METABOLIC PANEL WITH GFR
ALT: 20 IU/L (ref 0–44)
AST: 15 IU/L (ref 0–40)
Albumin/Globulin Ratio: 1.9 (ref 1.2–2.2)
Albumin: 4.5 g/dL (ref 3.5–5.5)
Alkaline Phosphatase: 125 IU/L — ABNORMAL HIGH (ref 39–117)
BUN/Creatinine Ratio: 9 (ref 9–20)
BUN: 10 mg/dL (ref 6–24)
Bilirubin Total: 0.5 mg/dL (ref 0.0–1.2)
CO2: 25 mmol/L (ref 20–29)
Calcium: 9.3 mg/dL (ref 8.7–10.2)
Chloride: 104 mmol/L (ref 96–106)
Creatinine, Ser: 1.13 mg/dL (ref 0.76–1.27)
GFR calc Af Amer: 87 mL/min/1.73
GFR calc non Af Amer: 75 mL/min/1.73
Globulin, Total: 2.4 g/dL (ref 1.5–4.5)
Glucose: 109 mg/dL — ABNORMAL HIGH (ref 65–99)
Potassium: 3.9 mmol/L (ref 3.5–5.2)
Sodium: 141 mmol/L (ref 134–144)
Total Protein: 6.9 g/dL (ref 6.0–8.5)

## 2018-06-10 LAB — LIPID PANEL
Chol/HDL Ratio: 4.7 ratio (ref 0.0–5.0)
Cholesterol, Total: 188 mg/dL (ref 100–199)
HDL: 40 mg/dL
LDL Calculated: 119 mg/dL — ABNORMAL HIGH (ref 0–99)
Triglycerides: 143 mg/dL (ref 0–149)
VLDL Cholesterol Cal: 29 mg/dL (ref 5–40)

## 2018-06-10 LAB — URINALYSIS
Bilirubin, UA: NEGATIVE
Glucose, UA: NEGATIVE
Ketones, UA: NEGATIVE
Leukocytes, UA: NEGATIVE
Nitrite, UA: NEGATIVE
Urobilinogen, Ur: 1 mg/dL (ref 0.2–1.0)
pH, UA: 5.5 (ref 5.0–7.5)

## 2018-06-10 LAB — MICROALBUMIN / CREATININE URINE RATIO
Creatinine, Urine: 393.7 mg/dL
Microalb/Creat Ratio: 3.9 mg/g{creat} (ref 0.0–30.0)
Microalbumin, Urine: 15.3 ug/mL

## 2018-06-14 ENCOUNTER — Telehealth: Payer: Self-pay | Admitting: *Deleted

## 2018-06-14 ENCOUNTER — Other Ambulatory Visit: Payer: Self-pay | Admitting: Family Medicine

## 2018-06-14 DIAGNOSIS — E785 Hyperlipidemia, unspecified: Secondary | ICD-10-CM

## 2018-06-14 DIAGNOSIS — Z79899 Other long term (current) drug therapy: Secondary | ICD-10-CM

## 2018-06-14 MED ORDER — SIMVASTATIN 20 MG PO TABS: ORAL_TABLET | ORAL | 11 refills | Status: DC

## 2018-06-14 NOTE — Telephone Encounter (Signed)
Please let patient know that a prescription was sent into this pharmacy for simvastatin 20 mg to take once each evening. Patient should also return for repeat LFT's in about 6 weeks after starting the cholesterol medication.

## 2018-06-14 NOTE — Progress Notes (Signed)
Patient ID: Dustin Hale, male   DOB: 1966-08-08, 52 y.o.   MRN: [id number]   Patient with mild increase in LDL on recent lipid panel and patient is willing to try a low dose of statin. Patient has also had a recent UA with hematuria and the culture was normal therefore patient will have a repeat UA tomorrow. Prescription will be sent in for simvastatin 20 mg and patient will be asked to return for repeat LFTS in 4-6 weeks after starting the cholesterol medication.

## 2018-06-14 NOTE — Telephone Encounter (Signed)
Patient verified DOB Patient is aware of blood being present in the urine. Patient will report to the office tomorrow morning for another sample to be sent for culture. Patient also states he is willing to take a low dose cholesterol medication because he does struggle with fried foods. Patient is aware of goal being 73 and currently being at 119. No further questions

## 2018-06-14 NOTE — Telephone Encounter (Signed)
-----   Message from Antony Blackbird, MD sent at 06/11/2018  7:09 PM EDT ----- [name] UA was abnormal due to the presence of 2 plus blood- urine culture is pending (please follow-up with lab to make sure that the culture was done). Also, his microalbumin/creatinine ratio was normal. On his CMP, his glucose was 109 . He had a mild increase in his alkaline phosphatase at 125 (normal 39-117). Lipid panel showed an LDL of 119 and his goal is 70 or less. Please ask if he needs a RX for a low dose cholesterol medication if he is not already taking one?

## 2018-06-15 NOTE — Telephone Encounter (Signed)
Patient verified DOB Patient is aware of simvastatin being sent to the pharmacy to take every evening. Patient is aware of completing a recheck in 6 weeks. No further questions.

## 2018-06-16 ENCOUNTER — Ambulatory Visit: Payer: Self-pay | Attending: Family Medicine

## 2018-06-16 DIAGNOSIS — R319 Hematuria, unspecified: Secondary | ICD-10-CM | POA: Insufficient documentation

## 2018-06-17 LAB — URINALYSIS, COMPLETE
Bilirubin, UA: NEGATIVE
Glucose, UA: NEGATIVE
Ketones, UA: NEGATIVE
Leukocytes, UA: NEGATIVE
Nitrite, UA: NEGATIVE
Protein, UA: NEGATIVE
RBC, UA: NEGATIVE
Urobilinogen, Ur: 1 mg/dL (ref 0.2–1.0)
pH, UA: 6 (ref 5.0–7.5)

## 2018-06-17 LAB — MICROSCOPIC EXAMINATION: Casts: NONE SEEN /LPF

## 2018-06-18 LAB — URINE CULTURE

## 2018-06-19 ENCOUNTER — Telehealth: Payer: Self-pay | Admitting: Licensed Clinical Social Worker

## 2018-06-19 ENCOUNTER — Telehealth: Payer: Self-pay | Admitting: *Deleted

## 2018-06-19 NOTE — Telephone Encounter (Signed)
-----   Message from Antony Blackbird, MD sent at 06/19/2018 11:28 AM EDT ----- Patient with mixed urogenital flora on urine culture and this does not require antibiotic treatment

## 2018-06-19 NOTE — Telephone Encounter (Signed)
Call placed to patient. LCSWA introduced self and explained role at [place] disclosed that she received a consult from Dr. Chapman Fitch to address behavioral health and/or resource needs.   Pt agreed to schedule appointment with [facility] for 06/29/18.

## 2018-06-19 NOTE — Telephone Encounter (Signed)
Patient verified DOB Patient is aware of Antibiotic being needed for the urine culture and was scheduled for Financial counseling on 06/29/18 at 1:30PM

## 2018-06-29 ENCOUNTER — Ambulatory Visit: Payer: Self-pay | Attending: Family Medicine

## 2018-06-29 ENCOUNTER — Ambulatory Visit: Payer: Self-pay | Attending: Family Medicine | Admitting: Licensed Clinical Social Worker

## 2018-06-29 DIAGNOSIS — F419 Anxiety disorder, unspecified: Secondary | ICD-10-CM

## 2018-06-29 DIAGNOSIS — Z598 Other problems related to housing and economic circumstances: Secondary | ICD-10-CM

## 2018-06-29 DIAGNOSIS — Z599 Problem related to housing and economic circumstances, unspecified: Secondary | ICD-10-CM

## 2018-06-29 NOTE — BH Specialist Note (Signed)
Integrated Behavioral Health Initial Visit  MRN: [id number] Name: Dustin Hale  Number of [facility] Clinician visits:: 1/6 Session Start time: 2:15 PM  Session End time: 2:45 PM Total time: 30 minutes  Type of Service: [place] Interpretor:No. Interpretor Name and Language: N/A  SUBJECTIVE: Dustin Hale is a 52 y.o. male accompanied by self Patient was referred by Dr. Chapman Fitch for depression and anxiety. Patient reports the following symptoms/concerns: feelings of sadness and worry, feeling bad about self, restlessness, and irritability Duration of problem: Months; Severity of problem: moderate  OBJECTIVE: Mood: Appropriate and Affect: Appropriate Risk of harm to self or others: No plan to harm self or others  LIFE CONTEXT: Family and Social: Pt receives strong support from girlfriend School/Work: Pt has been trying to obtain employment for [employer]. He has been denied disability six times. 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 Reduce symptoms of: anxiety, depression and stress 2. Increase knowledge and/or ability of: coping skills and healthy habits  3. Demonstrate ability to: Increase adequate support systems for patient/family  INTERVENTIONS: Interventions utilized: Solution-Focused Strategies, Supportive Counseling, Psychoeducation and/or Health Education and Link to [employer]  Standardized Assessments completed: Not Needed  ASSESSMENT: Patient currently experiencing depression and anxiety triggered by financial strain due to his inability to obtain employment. He reports feelings of sadness and worry, feeling bad about self, restlessness, and irritability. Pt  receives strong support from family and girlfriend.   Patient may benefit from psychoeducation, psychotherapy, and link to community resources. LCSWA educated pt on the correlation between one's physical and mental health, in addition, to how stress can negatively impact both. Therapeutic interventions were discussed to decrease symptoms and increase positive feelings. Supportive resources were provided in reference to employment. Pt was appreciative for the assistance.   PLAN: 1. Follow up with behavioral health clinician on : Pt was encouraged to contact LCSWA if symptoms worsen or fail to improve to schedule behavioral appointments at [facility]. 2. Behavioral recommendations: LCSWA recommends that pt apply healthy coping skills discussed and utilize provided resources provided. Pt is encouraged to schedule follow up appointment with LCSWA 3. Referral(s): [place] (In Clinic) and [employer] Resources:  Finances 4. "From scale of 1-10, how likely are you to follow plan?":   Rebekah Chesterfield, LCSW 06/30/18 3:11 PM

## 2018-07-11 ENCOUNTER — Ambulatory Visit (AMBULATORY_SURGERY_CENTER): Payer: Self-pay | Admitting: *Deleted

## 2018-07-11 ENCOUNTER — Other Ambulatory Visit: Payer: Self-pay

## 2018-07-11 VITALS — Ht 70.0 in | Wt 238.4 lb

## 2018-07-11 DIAGNOSIS — Z1211 Encounter for screening for malignant neoplasm of colon: Secondary | ICD-10-CM

## 2018-07-11 MED ORDER — PEG 3350-KCL-NABCB-NACL-NASULF 236 G PO SOLR: 4000.0000 mL | Freq: Once | ORAL | 0 refills | Status: AC

## 2018-07-11 NOTE — Progress Notes (Signed)
Patient denies any allergies to egg or soy products. Patient has never had anesthesia.  No prior surgery.  Patient denies oxygen use at home and denies diet medications. Pamphlet given on colonoscopy.

## 2018-07-19 ENCOUNTER — Telehealth: Payer: Self-pay | Admitting: Gastroenterology

## 2018-07-19 NOTE — Telephone Encounter (Signed)
Left message for pt on his cell number to call us back as soon as possible. Najat Olazabal  Pt returned call. I asked the pt what the Golytely would cost and he said it was around $10, but wanted to know if we had samples. Explained to the pt this was one of the less expensive preps and we do not have samples. Pt states he understood and he will go buy it now! Informed the pt it was getting late in the day and he was supposed to have mixed the prep this am, since he is scheduled for his colon tomorrow. Pt understood. He will call back if he has further questions. Gwyndolyn Saxon

## 2018-07-20 ENCOUNTER — Encounter: Payer: Self-pay | Admitting: Gastroenterology

## 2018-07-20 ENCOUNTER — Telehealth: Payer: Self-pay | Admitting: Gastroenterology

## 2018-07-20 NOTE — Telephone Encounter (Signed)
Walked into the office to let us know he was not able to obtain the prep medication yesterday. Is very sorry. Rescheduled for the next available 08-29-18. Is going to get his medication today. Will call back if he has any further problems obtaining it.

## 2018-07-20 NOTE — Telephone Encounter (Signed)
A user error has taken place.

## 2018-07-20 NOTE — Telephone Encounter (Signed)
Please let patient know it is not a problem and we want him to be real clean for his upcoming procedure. Thanks. Reschedule at patient's earliest convenience. Dustin Hale

## 2018-08-28 ENCOUNTER — Telehealth: Payer: Self-pay | Admitting: Gastroenterology

## 2018-08-28 NOTE — Telephone Encounter (Signed)
Pt called to inform that his brother, who is going to be his care partner for his proc tomorrow just called him to tell him that he was going out of town today. Pt does not want to reschedule procedure and inquired if he could come in by himself, I explained the policy to pt. He stated that was going to look for another care partner and call us back before 5:00pm to let us know if he was able to find one.

## 2018-08-29 ENCOUNTER — Encounter: Payer: Self-pay | Admitting: Gastroenterology

## 2018-08-31 ENCOUNTER — Telehealth: Payer: Self-pay | Admitting: *Deleted

## 2018-08-31 NOTE — Telephone Encounter (Signed)

## 2018-09-15 ENCOUNTER — Ambulatory Visit: Payer: Self-pay | Attending: Family Medicine

## 2018-09-15 ENCOUNTER — Encounter: Payer: Self-pay | Admitting: Family Medicine

## 2018-09-15 ENCOUNTER — Ambulatory Visit: Payer: Self-pay | Admitting: Family Medicine

## 2018-09-15 NOTE — Progress Notes (Signed)
   Subjective:    Patient ID: Dustin Hale, male    DOB: Jun 19, 1966, 53 y.o.   MRN: [id number]  HPI       53 year old male with past medical history significant for type 2 diabetes and cervical radiculopathy who is seen as a same-day/work-in visit secondary to complaint of numbness in his fingertips.  Patient was last seen in the office on 10-20 19 and hemoglobin A1c at that time was 5.7 indicating that his blood sugars are very well controlled.  Patient also had lipid panel done at that time showing an LDL of 119 and patient was asked to start a cholesterol medication and return in 4 to 6 weeks for hepatic function panel.  Patient also had urinalysis with proteinuria and 2+ RBCs at his last visit.  PATIENT LEFT WITHOUT BEING SEEN Review of Systems     Objective:   Physical Exam        Assessment & Plan:

## 2018-09-26 ENCOUNTER — Encounter: Payer: Self-pay | Admitting: Internal Medicine

## 2018-09-26 ENCOUNTER — Other Ambulatory Visit: Payer: Self-pay

## 2018-09-26 ENCOUNTER — Ambulatory Visit: Payer: Self-pay

## 2018-09-26 ENCOUNTER — Ambulatory Visit: Payer: Self-pay | Attending: Internal Medicine | Admitting: Internal Medicine

## 2018-09-26 VITALS — BP 137/87 | HR 64 | Temp 98.7°F | Resp 20 | Ht 69.0 in | Wt 241.0 lb

## 2018-09-26 DIAGNOSIS — Z6835 Body mass index (BMI) 35.0-35.9, adult: Secondary | ICD-10-CM | POA: Insufficient documentation

## 2018-09-26 DIAGNOSIS — E119 Type 2 diabetes mellitus without complications: Secondary | ICD-10-CM | POA: Insufficient documentation

## 2018-09-26 DIAGNOSIS — E669 Obesity, unspecified: Secondary | ICD-10-CM | POA: Insufficient documentation

## 2018-09-26 DIAGNOSIS — R3129 Other microscopic hematuria: Secondary | ICD-10-CM | POA: Insufficient documentation

## 2018-09-26 DIAGNOSIS — IMO0001 Reserved for inherently not codable concepts without codable children: Secondary | ICD-10-CM

## 2018-09-26 DIAGNOSIS — Z23 Encounter for immunization: Secondary | ICD-10-CM | POA: Insufficient documentation

## 2018-09-26 DIAGNOSIS — R202 Paresthesia of skin: Secondary | ICD-10-CM | POA: Insufficient documentation

## 2018-09-26 DIAGNOSIS — R109 Unspecified abdominal pain: Secondary | ICD-10-CM | POA: Insufficient documentation

## 2018-09-26 DIAGNOSIS — Z87891 Personal history of nicotine dependence: Secondary | ICD-10-CM | POA: Insufficient documentation

## 2018-09-26 DIAGNOSIS — R209 Unspecified disturbances of skin sensation: Secondary | ICD-10-CM

## 2018-09-26 LAB — POCT URINALYSIS DIP (CLINITEK)
BILIRUBIN UA: NEGATIVE
Glucose, UA: NEGATIVE mg/dL
LEUKOCYTES UA: NEGATIVE
Nitrite, UA: NEGATIVE
SPEC GRAV UA: 1.02 (ref 1.010–1.025)
Urobilinogen, UA: 1 E.U./dL
pH, UA: 7 (ref 5.0–8.0)

## 2018-09-26 LAB — POCT GLYCOSYLATED HEMOGLOBIN (HGB A1C): HbA1c, POC (prediabetic range): 5.8 % (ref 5.7–6.4)

## 2018-09-26 LAB — GLUCOSE, POCT (MANUAL RESULT ENTRY): POC GLUCOSE: 127 mg/dL — AB (ref 70–99)

## 2018-09-26 NOTE — Patient Instructions (Addendum)
Schedule appointment with neurologist Schedule ultrasound of kidney then schedule appointment with urologist Follow diet as discussed

## 2018-09-26 NOTE — Progress Notes (Signed)
Pt c/o tingling right 2nd/3rd finger, occas tingling in left hand. Machine operation, yard work,  Sx exac by resting arm, raising arm, no weakness, no paresthesias in legs. Also reports pain in right neck. Eval in past for same and told he had a pinched nerve. No c/p, no sob. No exertional sx. Occas pain in right side x 6 mos. Sx noted w laying on right side and with prolonged sitting. No radiation, no hematuria, no f/c, urinary freq at night, no n/v. Active, walks regularly Quit smoking 09/06/2018  Discontinued metformin due to se  No meds currently  NKDA Patient Active Problem List   Diagnosis Date Noted  . Right lateral epicondylitis 10/01/2016  . Mental health problem 10/01/2016  . Degenerative joint disease, shoulder, left 10/27/2015  . Farsightedness 06/20/2015  . Diabetes type 2, controlled ([place]) 06/20/2015  . Radicular pain in left arm 06/20/2015  . Current smoker 06/20/2015    Past Medical History:  Diagnosis Date  . Diabetes mellitus without complication ([place])   . Hyperlipidemia   . Obesity   . Poor dental hygiene    Past Surgical History:  Procedure Laterality Date  . NO PAST SURGERIES      Family History  Problem Relation Age of Onset  . Hypertension Mother   . Hypertension Father   . Hypertension Maternal Grandmother   . Hypertension Maternal Grandfather   . Hypertension Paternal Grandmother   . Colon cancer Neg Hx   . Rectal cancer Neg Hx   . Stomach cancer Neg Hx     SH: quit smoking, no etoh Male in NAD Neck: npt of c-spine, mild ttp r upper trapezius Cv: rrr Lungs: cta Back: r cva ttp UE: dtr's 1+, motor 5/5 A/p 1. Paresthesias/numbness prob pinched nerve, but with persistent sx will req neuro consult for ?EMG/NCS - Ambulatory referral to Neurology  2. Flank pain Assoc w hematuria, will need further eval w imaging, ?cysto pending u/s results - POCT URINALYSIS DIP (CLINITEK) - Urine Culture - Urinalysis with microscopic - US RENAL; Future -  Ambulatory referral to Urology  3. Hematuria, microscopic - Urine Culture - Urinalysis with microscopic - US RENAL; Future - Ambulatory referral to Urology  4. Type 2 diabetes mellitus without complication, without long-term current use of insulin ([facility]) Discussed diet, wt loss, - Glucose (CBG) - HgB A1c  5. Need for immunization against influenza - Flu Vaccine QUAD 36+ mos IM  PNEUMOVAX

## 2018-09-26 NOTE — Progress Notes (Signed)
Tingling in fingers for over 6 months, soreness on right side near kidney. Urinating frequently

## 2018-09-27 LAB — URINALYSIS, ROUTINE W REFLEX MICROSCOPIC
Bilirubin, UA: NEGATIVE
Glucose, UA: NEGATIVE
KETONES UA: NEGATIVE
LEUKOCYTES UA: NEGATIVE
Nitrite, UA: NEGATIVE
PROTEIN UA: NEGATIVE
Specific Gravity, UA: 1.024 (ref 1.005–1.030)
Urobilinogen, Ur: 1 mg/dL (ref 0.2–1.0)
pH, UA: 7.5 (ref 5.0–7.5)

## 2018-09-27 LAB — MICROSCOPIC EXAMINATION
Casts: NONE SEEN /lpf
EPITHELIAL CELLS (NON RENAL): NONE SEEN /HPF (ref 0–10)

## 2018-09-27 MED FILL — GOLYTELY SOLUTION: 236 | 1 days supply | Qty: 4000 | Fill #0 | Status: TO

## 2018-09-28 LAB — URINE CULTURE: ORGANISM ID, BACTERIA: NO GROWTH

## 2018-09-29 ENCOUNTER — Ambulatory Visit (HOSPITAL_COMMUNITY)
Admission: RE | Admit: 2018-09-29 | Discharge: 2018-09-29 | Disposition: A | Discharge status: Home / Self Care | Payer: Self-pay | Source: Ambulatory Visit | Attending: Internal Medicine | Admitting: Internal Medicine

## 2018-09-29 DIAGNOSIS — R3129 Other microscopic hematuria: Secondary | ICD-10-CM | POA: Insufficient documentation

## 2018-09-29 DIAGNOSIS — R109 Unspecified abdominal pain: Secondary | ICD-10-CM | POA: Insufficient documentation

## 2018-10-03 ENCOUNTER — Ambulatory Visit (AMBULATORY_SURGERY_CENTER): Payer: Self-pay | Admitting: Gastroenterology

## 2018-10-03 ENCOUNTER — Telehealth: Payer: Self-pay | Admitting: Family Medicine

## 2018-10-03 ENCOUNTER — Encounter: Payer: Self-pay | Admitting: Gastroenterology

## 2018-10-03 VITALS — BP 112/78 | HR 65 | Temp 95.3°F | Resp 16 | Ht 69.0 in | Wt 241.0 lb

## 2018-10-03 DIAGNOSIS — D120 Benign neoplasm of cecum: Secondary | ICD-10-CM

## 2018-10-03 DIAGNOSIS — D127 Benign neoplasm of rectosigmoid junction: Secondary | ICD-10-CM

## 2018-10-03 DIAGNOSIS — K621 Rectal polyp: Secondary | ICD-10-CM

## 2018-10-03 DIAGNOSIS — D122 Benign neoplasm of ascending colon: Secondary | ICD-10-CM

## 2018-10-03 DIAGNOSIS — Z1211 Encounter for screening for malignant neoplasm of colon: Secondary | ICD-10-CM

## 2018-10-03 MED ORDER — SODIUM CHLORIDE 0.9 % IV SOLN: 500.0000 mL | Freq: Once | INTRAVENOUS | Status: DC

## 2018-10-03 NOTE — Progress Notes (Signed)
Called to room to assist during endoscopic procedure.  Patient ID and intended procedure confirmed with present staff. Received instructions for my participation in the procedure from the performing physician.  

## 2018-10-03 NOTE — Progress Notes (Signed)
Patient states he ate up until 20:00 yesterday, 10/02/2018. Informed patient that he should have only done clear liquids yesterday. Dr Rush Landmark notified, states to look at last bowel movement and assess if clear, and to tell patient he may not be able to have a complete procedure. Patient understood. Patient did have a bowel movement while waiting in admitting and it was clear.

## 2018-10-03 NOTE — Telephone Encounter (Signed)
Pt called to request his x-ray results, please follow up when possible

## 2018-10-03 NOTE — Telephone Encounter (Signed)
Patient is requesting [place] results. Please review and forward result note to RMA

## 2018-10-03 NOTE — Op Note (Signed)
[name] Patient Name: Dustin Hale Procedure Date: 10/03/2018 11:44 AM MRN: [id number] Endoscopist: Justice Britain , MD Age: 53 Referring MD:  Date of Birth: [birth year] Gender: Male Account #: [id number] Procedure:                Colonoscopy Indications:              Screening for colorectal malignant neoplasm Medicines:                Monitored Anesthesia Care Procedure:                Pre-Anesthesia Assessment:                           - Prior to the procedure, a History and Physical                            was performed, and patient medications and                            allergies were reviewed. The patient's tolerance of                            previous anesthesia was also reviewed. The risks                            and benefits of the procedure and the sedation                            options and risks were discussed with the patient.                            All questions were answered, and informed consent                            was obtained. Prior Anticoagulants: The patient has                            taken no previous anticoagulant or antiplatelet                            agents. ASA Grade Assessment: II - A patient with                            mild systemic disease. After reviewing the risks                            and benefits, the patient was deemed in                            satisfactory condition to undergo the procedure.                           After obtaining informed consent, the colonoscope  was passed under direct vision. Throughout the                            procedure, the patient's blood pressure, pulse, and                            oxygen saturations were monitored continuously. The                            Colonoscope was introduced through the anus and                            advanced to the 5 cm into the ileum. The                            colonoscopy was performed  without difficulty. The                            patient tolerated the procedure. The quality of the                            bowel preparation was evaluated using the BBPS                            [facility] Bowel Preparation Scale) with scores of:                            Right Colon = 2 (minor amount of residual staining,                            small fragments of stool and/or opaque liquid, but                            mucosa seen well), Transverse Colon = 3 (entire                            mucosa seen well with no residual staining, small                            fragments of stool or opaque liquid) and Left Colon                            = 3 (entire mucosa seen well with no residual                            staining, small fragments of stool or opaque                            liquid). The total BBPS score equals 8. The quality                            of the bowel preparation was good. Scope In: 11:53:35  AM Scope Out: 12:10:43 PM Scope Withdrawal Time: 0 hours 13 minutes 45 seconds  Total Procedure Duration: 0 hours 17 minutes 8 seconds  Findings:                 Skin tags were found on perianal exam.                           The digital rectal exam findings include                            non-thrombosed internal hemorrhoids. Pertinent                            negatives include no palpable rectal lesions.                           The terminal ileum and ileocecal valve appeared                            normal.                           Two sessile polyps were found in the ascending                            colon and cecum. The polyps were 2 to 3 mm in size.                            These polyps were removed with a cold snare.                            Resection and retrieval were complete.                           Two sessile polyps were found in the rectum and                            recto-sigmoid colon. The polyps were 2 to 3 mm in                             size. These polyps were removed with a cold snare.                            Resection and retrieval were complete.                           A single small-mouthed diverticulum was found in                            the ascending colon.                           Normal mucosa was found in the entire colon  otherwise.                           Non-bleeding non-thrombosed internal hemorrhoids                            were found during retroflexion, during perianal                            exam and during digital exam. The hemorrhoids were                            Grade II (internal hemorrhoids that prolapse but                            reduce spontaneously). Complications:            No immediate complications. Estimated Blood Loss:     Estimated blood loss was minimal. Impression:               - Perianal skin tags found on perianal exam.                           - Non-thrombosed internal hemorrhoids found on                            digital rectal exam.                           - The examined portion of the ileum was normal.                           - Two 2 to 3 mm polyps in the ascending colon and                            in the cecum, removed with a cold snare. Resected                            and retrieved.                           - Two 2 to 3 mm polyps in the rectum and at the                            recto-sigmoid colon, removed with a cold snare.                            Resected and retrieved.                           - Single diverticulum in the ascending colon.                           - Normal mucosa in the entire examined colon  otherwise.                           - Non-bleeding non-thrombosed internal hemorrhoids. Recommendation:           - The patient will be observed post-procedure,                            until all discharge criteria are met.                           -  Discharge patient to home.                           - Patient has a contact number available for                            emergencies. The signs and symptoms of potential                            delayed complications were discussed with the                            patient. Return to normal activities tomorrow.                            Written discharge instructions were provided to the                            patient.                           - High fiber diet.                           - Use fiber, for example Citrucel, Fibercon, Konsyl                            or Metamucil once daily.                           - Await pathology results.                           - Repeat colonoscopy in 11/08/08 years for                            surveillance based on pathology results and                            findings of adenomatous tissue.                           - The findings and recommendations were discussed                            with the patient.                           -  The findings and recommendations were discussed                            with the patient's family. Justice Britain, MD 10/03/2018 12:16:53 PM

## 2018-10-03 NOTE — Patient Instructions (Addendum)
4 polyps removed today and sent to lab. Handouts given on polyps, diverticulosis and hemorrhoids. The hemorrhoids are non-bleeding and internal. Use fiber such as Benefiber, Citrucel, Metamucil daily.   YOU HAD AN ENDOSCOPIC PROCEDURE TODAY AT [facility]:   Refer to the procedure report that was given to you for any specific questions about what was found during the examination.  If the procedure report does not answer your questions, please call your gastroenterologist to clarify.  If you requested that your care partner not be given the details of your procedure findings, then the procedure report has been included in a sealed envelope for you to review at your convenience later.  YOU SHOULD EXPECT: Some feelings of bloating in the abdomen. Passage of more gas than usual.  Walking can help get rid of the air that was put into your GI tract during the procedure and reduce the bloating. If you had a lower endoscopy (such as a colonoscopy or flexible sigmoidoscopy) you may notice spotting of blood in your stool or on the toilet paper. If you underwent a bowel prep for your procedure, you may not have a normal bowel movement for a few days.  Please Note:  You might notice some irritation and congestion in your nose or some drainage.  This is from the oxygen used during your procedure.  There is no need for concern and it should clear up in a day or so.  SYMPTOMS TO REPORT IMMEDIATELY:   Following lower endoscopy (colonoscopy or flexible sigmoidoscopy):  Excessive amounts of blood in the stool  Significant tenderness or worsening of abdominal pains  Swelling of the abdomen that is new, acute  Fever of 100F or higher   For urgent or emergent issues, a gastroenterologist can be reached at any hour by calling [phone].   DIET:  We do recommend a small meal at first, but then you may proceed to your regular diet.  Drink plenty of fluids but you should avoid alcoholic  beverages for 24 hours.  ACTIVITY:  You should plan to take it easy for the rest of today and you should NOT DRIVE or use heavy machinery until tomorrow (because of the sedation medicines used during the test).    FOLLOW UP: Our staff will call the number listed on your records the next business day following your procedure to check on you and address any questions or concerns that you may have regarding the information given to you following your procedure. If we do not reach you, we will leave a message.  However, if you are feeling well and you are not experiencing any problems, there is no need to return our call.  We will assume that you have returned to your regular daily activities without incident.  If any biopsies were taken you will be contacted by phone or by letter within the next 1-3 weeks.  Please call us at [phone] if you have not heard about the biopsies in 3 weeks.    SIGNATURES/CONFIDENTIALITY: You and/or your care partner have signed paperwork which will be entered into your electronic medical record.  These signatures attest to the fact that that the information above on your After Visit Summary has been reviewed and is understood.  Full responsibility of the confidentiality of this discharge information lies with you and/or your care-partner.

## 2018-10-03 NOTE — Progress Notes (Signed)
Spontaneous respirations throughout. VSS. Resting comfortably. To PACU on room air. Report to  RN. 

## 2018-10-04 ENCOUNTER — Telehealth: Payer: Self-pay | Admitting: *Deleted

## 2018-10-04 ENCOUNTER — Telehealth: Payer: Self-pay

## 2018-10-04 NOTE — Telephone Encounter (Signed)
  Follow up Call-  Call back number 10/03/2018  Post procedure Call Back phone  # [phone]  Permission to leave phone message Yes  Some recent data might be hidden     Patient questions:  Do you have a fever, pain , or abdominal swelling? No. Pain Score  0 *  Have you tolerated food without any problems? Yes.    Have you been able to return to your normal activities? Yes.    Do you have any questions about your discharge instructions: Diet   No. Medications  No. Follow up visit  No.  Do you have questions or concerns about your Care? No.  Actions: * If pain score is 4 or above: No action needed, pain <4.

## 2018-10-04 NOTE — Telephone Encounter (Signed)
No answer, left message to call if questions or concerns. 

## 2018-10-05 NOTE — Telephone Encounter (Signed)
[place] was ordered by Dr. Loralee Pacas. Please see if you can contact the ordering provider so that they can review the results and release them to the patient

## 2018-10-05 NOTE — Telephone Encounter (Signed)
Patient called again requesting his [place] results. Please f/u with patient

## 2018-10-06 ENCOUNTER — Encounter: Payer: Self-pay | Admitting: Gastroenterology

## 2018-10-06 NOTE — Telephone Encounter (Signed)
Pt called to review u/s results. NA, lmtcb. U/S showed enlarged prostate, otherwise normal.

## 2018-10-10 ENCOUNTER — Other Ambulatory Visit: Payer: Self-pay

## 2018-10-10 ENCOUNTER — Other Ambulatory Visit: Payer: Self-pay | Admitting: Critical Care Medicine

## 2018-10-10 DIAGNOSIS — N400 Enlarged prostate without lower urinary tract symptoms: Secondary | ICD-10-CM

## 2018-10-10 NOTE — Telephone Encounter (Signed)
Patient came in the office for results. While he was here, he also went to lab to have PSA and was scheduled an appointment to establish care with Dr. Chapman Fitch.

## 2018-10-10 NOTE — Telephone Encounter (Signed)
Left message on voicemail to return call. Pt also needs an appointment to have PSA and follow with PCP or Dr. Joya Gaskins. Order is already placed for PSA.

## 2018-10-10 NOTE — Progress Notes (Signed)
PSA is needed.  Prostate is enlarged.  On U/S

## 2018-10-11 LAB — PSA: Prostate Specific Ag, Serum: 0.9 ng/mL (ref 0.0–4.0)

## 2018-10-16 DIAGNOSIS — E785 Hyperlipidemia, unspecified: Secondary | ICD-10-CM | POA: Insufficient documentation

## 2018-10-24 ENCOUNTER — Ambulatory Visit: Payer: Self-pay | Admitting: Family Medicine

## 2018-10-25 ENCOUNTER — Ambulatory Visit: Payer: Self-pay | Attending: Family Medicine | Admitting: Family Medicine

## 2018-10-25 ENCOUNTER — Encounter: Payer: Self-pay | Admitting: Family Medicine

## 2018-10-25 VITALS — BP 126/70 | HR 61 | Temp 98.7°F | Ht 69.0 in | Wt 244.0 lb

## 2018-10-25 DIAGNOSIS — E119 Type 2 diabetes mellitus without complications: Secondary | ICD-10-CM

## 2018-10-25 DIAGNOSIS — R319 Hematuria, unspecified: Secondary | ICD-10-CM

## 2018-10-25 DIAGNOSIS — N400 Enlarged prostate without lower urinary tract symptoms: Secondary | ICD-10-CM

## 2018-10-25 DIAGNOSIS — Z794 Long term (current) use of insulin: Secondary | ICD-10-CM

## 2018-10-25 LAB — GLUCOSE, POCT (MANUAL RESULT ENTRY): POC Glucose: 121 mg/dl — AB (ref 70–99)

## 2018-10-25 LAB — POCT URINALYSIS DIP (CLINITEK)
Bilirubin, UA: NEGATIVE
Glucose, UA: NEGATIVE mg/dL
Ketones, POC UA: NEGATIVE mg/dL
Leukocytes, UA: NEGATIVE
Nitrite, UA: NEGATIVE
POC PROTEIN,UA: NEGATIVE
Spec Grav, UA: 1.02 (ref 1.010–1.025)
Urobilinogen, UA: 1 E.U./dL
pH, UA: 6.5 (ref 5.0–8.0)

## 2018-10-25 NOTE — Patient Instructions (Signed)
Hematuria, Adult  Hematuria is blood in the urine. Blood may be visible in the urine, or it may be identified with a test. This condition can be caused by infections of the bladder, urethra, kidney, or prostate. Other possible causes include:   Kidney stones.   Cancer of the urinary tract.   Too much calcium in the urine.   Conditions that are passed from parent to child (inherited conditions).   Exercise that requires a lot of energy.  Infections can usually be treated with medicine, and a kidney stone usually will pass through your urine. If neither of these is the cause of your hematuria, more tests may be needed to identify the cause of your symptoms.  It is very important to tell your health care provider about any blood in your urine, even if it is painless or the blood stops without treatment. Blood in the urine, when it happens and then stops and then happens again, can be a symptom of a very serious condition, including cancer. There is no pain in the initial stages of many urinary cancers.  Follow these instructions at home:  Medicines   Take over-the-counter and prescription medicines only as told by your health care provider.   If you were prescribed an antibiotic medicine, take it as told by your health care provider. Do not stop taking the antibiotic even if you start to feel better.  Eating and drinking   Drink enough fluid to keep your urine clear or pale yellow. It is recommended that you drink 3-4 quarts (2.8-3.8 L) a day. If you have been diagnosed with an infection, it is recommended that you drink cranberry juice in addition to large amounts of water.   Avoid caffeine, tea, and carbonated beverages. These tend to irritate the bladder.   Avoid alcohol because it may irritate the prostate (men).  General instructions   If you have been diagnosed with a kidney stone, follow your health care provider's instructions about straining your urine to catch the stone.   Empty your bladder  often. Avoid holding urine for long periods of time.   If you are male:  ? After a bowel movement, wipe from front to back and use each piece of toilet paper only once.  ? Empty your bladder before and after sex.   Pay attention to any changes in your symptoms. Tell your health care provider about any changes or any new symptoms.   It is your responsibility to get your test results. Ask your health care provider, or the department performing the test, when your results will be ready.   Keep all follow-up visits as told by your health care provider. This is important.  Contact a health care provider if:   You develop back pain.   You have a fever.   You have nausea or vomiting.   Your symptoms do not improve after 3 days.   Your symptoms get worse.  Get help right away if:   You develop severe vomiting and are unable take medicine without vomiting.   You develop severe pain in your back or abdomen even though you are taking medicine.   You pass a large amount of blood in your urine.   You pass blood clots in your urine.   You feel very weak or like you might faint.   You faint.  Summary   Hematuria is blood in the urine. It has many possible causes.   It is very important that you tell   your health care provider about any blood in your urine, even if it is painless or the blood stops without treatment.   Take over-the-counter and prescription medicines only as told by your health care provider.   Drink enough fluid to keep your urine clear or pale yellow.  This information is not intended to replace advice given to you by your health care provider. Make sure you discuss any questions you have with your health care provider.  Document Released: 08/23/2005 Document Revised: 09/25/2016 Document Reviewed: 09/25/2016  [employer] Interactive Patient Education  2019 [employer]

## 2018-10-25 NOTE — Progress Notes (Signed)
Established Patient Office Visit  Subjective:  Patient ID: Dustin Hale, male    DOB: [birth year]  Age: 53 y.o. MRN: [id number]  CC: Hematuria  HPI Alessio Bogan is a 53 year old male with a past medical history of Diabetes Mellitus Type 2 and hematuria. He has no other concerns today except discussing Ultrasound results and PSA lab results.  Diabetes Type II - (09/30/2016 Last HgbA1C was 5.7) Fasting Glucose 121 10/25/2018 Previous was 5.7 from 10/27/2015. Not taking his metformin, he is worried about potential adverse effects from Metformin even through he has not experienced any. He manages his diabetes by diet and walking.  Monitoring Glucose at home: Fasting 90-120s   He denies hypoglycemia episodes.  He is complaint with diabetic diet.   Eye Examination: He states he had Jan. 2019 Office in [place]: Jan. 2019 podiatrist on [address]     Hematuria - PSA 0.9 ( Feb 4th 2020) Prostate enlarged on ultra sound.  States he does not see any blood in his urine.    Colonoscopy: He had his colonoscopy on January 28th 2020. 2 polyps were removed.  Past Medical History:  Diagnosis Date  . Diabetes mellitus without complication ([place])   . Hyperlipidemia   . Obesity   . Poor dental hygiene     Past Surgical History:  Procedure Laterality Date  . NO PAST SURGERIES      Family History  Problem Relation Age of Onset  . Hypertension Mother   . Hypertension Father   . Hypertension Maternal Grandmother   . Hypertension Maternal Grandfather   . Hypertension Paternal Grandmother   . Colon cancer Neg Hx   . Rectal cancer Neg Hx   . Stomach cancer Neg Hx     Social History   Socioeconomic History  . Marital status: Single    Spouse name: Not on file  . Number of children: Not on file  . Years of education: Not on file  . Highest education level: Not on file  Occupational History  . Not on file  Social Needs  . Financial resource strain: Not on file  . Food  insecurity:    Worry: Not on file    Inability: Not on file  . Transportation needs:    Medical: Not on file    Non-medical: Not on file  Tobacco Use  . Smoking status: Former Smoker    Packs/day: 0.33    Years: 32.00    Pack years: 10.56    Types: Cigarettes  . Smokeless tobacco: Never Used  . Tobacco comment: quit smoking on 08/27/14  Substance and Sexual Activity  . Alcohol use: Yes    Alcohol/week: 0.0 standard drinks    Comment: occasional   . Drug use: Not Currently    Types: Marijuana    Comment: Last use August 2019  . Sexual activity: Yes    Partners: Female  Lifestyle  . Physical activity:    Days per week: Not on file    Minutes per session: Not on file  . Stress: Not on file  Relationships  . Social connections:    Talks on phone: Not on file    Gets together: Not on file    Attends religious service: Not on file    Active member of club or organization: Not on file    Attends meetings of clubs or organizations: Not on file    Relationship status: Not on file  . Intimate partner violence:  Fear of current or ex partner: Not on file    Emotionally abused: Not on file    Physically abused: Not on file    Forced sexual activity: Not on file  Other Topics Concern  . Not on file  Social History Narrative  . Not on file    Outpatient Medications Prior to Visit  Medication Sig Dispense Refill  . bisacodyl (BISACODYL) 5 MG EC tablet Take 5 mg by mouth once. Dulcolax 5 mg tab take as directed for colonoscopy prep.    . blood glucose meter kit and supplies KIT Dispense based on patient and insurance preference. Use up to four times daily as directed. (FOR ICD-9 250.00, 250.01). 1 each 2  . Blood Glucose Monitoring Suppl (TRUE METRIX METER) W/DEVICE KIT 1 each by Does not apply route as needed. 1 kit 0  . glucose blood (TRUE METRIX BLOOD GLUCOSE TEST) test strip 1 each by Other route 3 (three) times daily. 100 each 11  . metFORMIN (GLUCOPHAGE) 500 MG tablet  Take by mouth 2 (two) times daily with a meal.    . simvastatin (ZOCOR) 20 MG tablet One pill by mouth each evening to help lower cholesterol 30 tablet 11  . TRUEPLUS LANCETS 28G MISC 1 each by Does not apply route 3 (three) times daily. 100 each 11   No facility-administered medications prior to visit.     No Known Allergies  ROS Review of Systems  Constitutional: Negative for activity change, appetite change, chills, fatigue, fever and unexpected weight change.  Respiratory: Negative for cough and shortness of breath.   Cardiovascular: Negative for chest pain and palpitations.  Gastrointestinal: Negative for abdominal pain, blood in stool and nausea.  Endocrine: Negative for polydipsia, polyphagia and polyuria.  Genitourinary: Positive for hematuria. Negative for difficulty urinating, dysuria, scrotal swelling, testicular pain and urgency.  Skin: Negative for color change, rash and wound.      Objective:    Physical Exam  Constitutional: He is oriented to person, place, and time. He appears well-developed and well-nourished.  Cardiovascular: Normal rate, regular rhythm and normal heart sounds. Exam reveals no gallop and no friction rub.  No murmur heard. Pulmonary/Chest: Effort normal and breath sounds normal. No respiratory distress.  Abdominal: Soft. Bowel sounds are normal. There is no abdominal tenderness.  Neurological: He is alert and oriented to person, place, and time. He has normal reflexes.  Skin: Skin is warm and dry.  Psychiatric: He has a normal mood and affect. His behavior is normal. Judgment and thought content normal.  Vitals reviewed.   There were no vitals taken for this visit. Wt Readings from Last 3 Encounters:  10/03/18 109.3 kg  09/26/18 109.3 kg  07/11/18 108.1 kg     Health Maintenance Due  Topic Date Due  . PNEUMOCOCCAL POLYSACCHARIDE VACCINE AGE 26-64 HIGH RISK  07/27/1969  . TETANUS/TDAP  07/27/1986  . OPHTHALMOLOGY EXAM  12/12/2015  . FOOT  EXAM  06/19/2016    There are no preventive care reminders to display for this patient.  Lab Results  Component Value Date   TSH 0.876 09/30/2014   Lab Results  Component Value Date   WBC 8.6 02/13/2016   HGB 14.4 02/13/2016   HCT 43.9 02/13/2016   MCV 90.1 02/13/2016   PLT 163 02/13/2016   Lab Results  Component Value Date   NA 141 06/09/2018   K 3.9 06/09/2018   CO2 25 06/09/2018   GLUCOSE 109 (H) 06/09/2018   BUN 10 06/09/2018  CREATININE 1.13 06/09/2018   BILITOT 0.5 06/09/2018   ALKPHOS 125 (H) 06/09/2018   AST 15 06/09/2018   ALT 20 06/09/2018   PROT 6.9 06/09/2018   ALBUMIN 4.5 06/09/2018   CALCIUM 9.3 06/09/2018   ANIONGAP 8 02/13/2016   Lab Results  Component Value Date   CHOL 188 06/09/2018   Lab Results  Component Value Date   HDL 40 06/09/2018   Lab Results  Component Value Date   LDLCALC 119 (H) 06/09/2018   Lab Results  Component Value Date   TRIG 143 06/09/2018   Lab Results  Component Value Date   CHOLHDL 4.7 06/09/2018   Lab Results  Component Value Date   HGBA1C 5.8 09/26/2018      Assessment & Plan:   1. Controlled type 2 diabetes mellitus without complication, with long-term current use of insulin ([facility]) Controlled - Discontinue metformin per patient request. - Importance of continue adhere to diabetic diet discussed with patient; as well as continuing exercise   2. Hematuria, unspecified type Asymptomatic; Microscopic - Repeat Urinalysis - Urology referral for cystoscopy.  3. Benign prostatic hyperplasia without lower urinary tract symptoms Asymptomatic - Patient currently asymptomatic at this time; no need for medical management at this time. - Will continue to monitor and watch.   Problem List Items Addressed This Visit      Endocrine   Diabetes type 2, controlled ([place]) - Primary (Chronic)   Relevant Orders   POCT glucose (manual entry)      No orders of the defined types were placed in this  encounter.   Follow-up: Follow-Up within 6 weeks.    Neta Mends, RN

## 2018-10-27 ENCOUNTER — Telehealth: Payer: Self-pay | Admitting: Family Medicine

## 2018-10-27 DIAGNOSIS — R319 Hematuria, unspecified: Secondary | ICD-10-CM

## 2018-10-27 DIAGNOSIS — N400 Enlarged prostate without lower urinary tract symptoms: Secondary | ICD-10-CM

## 2018-10-27 DIAGNOSIS — R350 Frequency of micturition: Secondary | ICD-10-CM

## 2018-10-27 NOTE — Telephone Encounter (Signed)
Patient wants you to call him about his kidney

## 2018-10-27 NOTE — Telephone Encounter (Signed)
Patient states that he was seen recently in the office by another provider and patient had questions regarding his urinalysis which showed some blood in the urine.  Patient has not seen any visible blood in the urine but has had frequent urination including increased frequency of urination at night.  Patient has also had ultrasound done of the kidneys.  I discussed with the patient that the ultrasound did not show any issues with the kidneys themselves but did show an enlargement of the prostate.  Patient also had urinalysis done on 10/25/2018 which also showed moderate blood on urinalysis.  Discussed with the patient that I will place a referral for him to be seen by urology and patient was in agreement.

## 2018-11-09 ENCOUNTER — Ambulatory Visit: Payer: Self-pay | Attending: Family Medicine | Admitting: Family Medicine

## 2018-11-09 ENCOUNTER — Encounter: Payer: Self-pay | Admitting: Family Medicine

## 2018-11-09 ENCOUNTER — Other Ambulatory Visit (HOSPITAL_COMMUNITY): Payer: Self-pay | Admitting: Urology

## 2018-11-09 VITALS — BP 121/71 | HR 79 | Temp 98.9°F | Resp 18 | Ht 72.0 in | Wt 241.0 lb

## 2018-11-09 DIAGNOSIS — R3129 Other microscopic hematuria: Secondary | ICD-10-CM

## 2018-11-09 DIAGNOSIS — Z794 Long term (current) use of insulin: Secondary | ICD-10-CM

## 2018-11-09 DIAGNOSIS — N401 Enlarged prostate with lower urinary tract symptoms: Secondary | ICD-10-CM

## 2018-11-09 DIAGNOSIS — E119 Type 2 diabetes mellitus without complications: Secondary | ICD-10-CM

## 2018-11-09 DIAGNOSIS — R351 Nocturia: Secondary | ICD-10-CM

## 2018-11-09 DIAGNOSIS — R350 Frequency of micturition: Secondary | ICD-10-CM

## 2018-11-09 DIAGNOSIS — R319 Hematuria, unspecified: Secondary | ICD-10-CM

## 2018-11-09 LAB — POCT URINALYSIS DIP (CLINITEK)
Bilirubin, UA: NEGATIVE
Glucose, UA: NEGATIVE mg/dL
Leukocytes, UA: NEGATIVE
Nitrite, UA: NEGATIVE
POC PROTEIN,UA: NEGATIVE
Spec Grav, UA: 1.02
Urobilinogen, UA: 1 U/dL
pH, UA: 6

## 2018-11-09 LAB — POCT GLYCOSYLATED HEMOGLOBIN (HGB A1C): Hemoglobin A1C: 6 % — AB (ref 4.0–5.6)

## 2018-11-09 MED ORDER — METFORMIN HCL 500 MG PO TABS: ORAL_TABLET | ORAL | 5 refills | Status: DC

## 2018-11-09 MED ORDER — CIPROFLOXACIN HCL 500 MG PO TABS: 500.0000 mg | ORAL_TABLET | Freq: Two times a day (BID) | ORAL | 0 refills | Status: DC

## 2018-11-09 MED FILL — ?METFORMIN HCL 500MG TABL: 500 | 30 days supply | Qty: 60 | Fill #0

## 2018-11-09 MED FILL — CIPROFLOXACIN HCL 500 MG TA: 500 | 10 days supply | Qty: 20 | Fill #0

## 2018-11-09 NOTE — Progress Notes (Signed)
Established Patient Office Visit  Subjective:  Patient ID: Dustin Hale, male    DOB: 01-Jul-1966  Age: 53 y.o. MRN: [id number]  CC:  Chief Complaint  Patient presents with  . Follow-up    HPI Dustin Hale presents for follow-up of hematuria and complaint of continued urinary frequency/nocturia.  Patient is status post recent visit on 10/25/2018 with another provider here in the office.  Patient was seen in follow-up of diabetes and patient with last hemoglobin A1c on 09/26/2018 of 5.8 and patient was told that since his blood sugar was so well controlled that he could discontinue the use of metformin which patient had been taking sporadically previously.  Patient was also seen in follow-up of his renal ultrasound which had been ordered secondary to hematuria.  Per visit note, patient was made aware of the finding of an enlarged prostate on renal ultrasound and that his PSA was normal and that he had been referred to urology but the appointment was pending.  At today's visit, patient reports that he is being seen in follow-up of urinalysis done at his last visit as patient states that he was told that he had continued blood in his urine.  At today's visit, patient also complains of having to get up 3-4 times per night to urinate.  Patient denies any burning with urination patient does not have to strain to initiate his urinary stream and patient has noticed no weakening of his urinary stream.  Patient denies any sensation of incomplete bladder emptying or dribbling after urination patient denies any abdominal or pelvic pain.  Patient denies any penile discharge.  Patient has had no fever or chills.  Patient has seen no visible blood in the urine.  Patient reports that he does have recurrent right-sided back/flank pain which is worse if he tries to lie down on his right side and he believes that this is related to the issues that he is having with his kidneys/blood in the urine.      Patient also expressed  concern regarding coronavirus exposure and inquired several times as to how he could obtain a supply of masks.  Past Medical History:  Diagnosis Date  . Diabetes mellitus without complication ([place])   . Hyperlipidemia   . Obesity   . Poor dental hygiene     Past Surgical History:  Procedure Laterality Date  . NO PAST SURGERIES      Family History  Problem Relation Age of Onset  . Hypertension Mother   . Hypertension Father   . Hypertension Maternal Grandmother   . Hypertension Maternal Grandfather   . Hypertension Paternal Grandmother   . Colon cancer Neg Hx   . Rectal cancer Neg Hx   . Stomach cancer Neg Hx     Social History   Socioeconomic History  . Marital status: Single    Spouse name: Not on file  . Number of children: Not on file  . Years of education: Not on file  . Highest education level: Not on file  Occupational History  . Not on file  Social Needs  . Financial resource strain: Not on file  . Food insecurity:    Worry: Not on file    Inability: Not on file  . Transportation needs:    Medical: Not on file    Non-medical: Not on file  Tobacco Use  . Smoking status: Former Smoker    Packs/day: 0.33    Years: 32.00    Pack years: 10.56  Types: Cigarettes  . Smokeless tobacco: Never Used  . Tobacco comment: quit smoking on 08/27/14  Substance and Sexual Activity  . Alcohol use: Yes    Alcohol/week: 0.0 standard drinks    Comment: occasional   . Drug use: Not Currently    Types: Marijuana    Comment: Last use August 2019  . Sexual activity: Yes    Partners: Female  Lifestyle  . Physical activity:    Days per week: Not on file    Minutes per session: Not on file  . Stress: Not on file  Relationships  . Social connections:    Talks on phone: Not on file    Gets together: Not on file    Attends religious service: Not on file    Active member of club or organization: Not on file    Attends meetings of clubs or organizations: Not on file     Relationship status: Not on file  . Intimate partner violence:    Fear of current or ex partner: Not on file    Emotionally abused: Not on file    Physically abused: Not on file    Forced sexual activity: Not on file  Other Topics Concern  . Not on file  Social History Narrative  . Not on file    Outpatient Medications Prior to Visit  Medication Sig Dispense Refill  . bisacodyl (BISACODYL) 5 MG EC tablet Take 5 mg by mouth once. Dulcolax 5 mg tab take as directed for colonoscopy prep.    . blood glucose meter kit and supplies KIT Dispense based on patient and insurance preference. Use up to four times daily as directed. (FOR ICD-9 250.00, 250.01). 1 each 2  . Blood Glucose Monitoring Suppl (TRUE METRIX METER) W/DEVICE KIT 1 each by Does not apply route as needed. 1 kit 0  . glucose blood (TRUE METRIX BLOOD GLUCOSE TEST) test strip 1 each by Other route 3 (three) times daily. 100 each 11  . metFORMIN (GLUCOPHAGE) 500 MG tablet Take by mouth 2 (two) times daily with a meal.    . simvastatin (ZOCOR) 20 MG tablet One pill by mouth each evening to help lower cholesterol 30 tablet 11  . TRUEPLUS LANCETS 28G MISC 1 each by Does not apply route 3 (three) times daily. 100 each 11   No facility-administered medications prior to visit.     No Known Allergies  ROS Review of Systems  Constitutional: Positive for fatigue. Negative for chills and fever.  HENT: Negative for sore throat and trouble swallowing.   Respiratory: Negative for cough and shortness of breath.   Cardiovascular: Negative for chest pain, palpitations and leg swelling.  Gastrointestinal: Negative for abdominal pain, blood in stool, constipation, diarrhea, nausea and vomiting.  Endocrine: Positive for polyuria. Negative for polydipsia and polyphagia.  Genitourinary: Positive for flank pain and frequency. Negative for dysuria.  Musculoskeletal: Positive for back pain. Negative for gait problem.  Neurological: Negative for  dizziness and headaches.  Hematological: Negative for adenopathy. Does not bruise/bleed easily.      Objective:    Physical Exam  Constitutional: He is oriented to person, place, and time. He appears well-developed and well-nourished. No distress.  Neck: Normal range of motion. Neck supple. No JVD present.  Cardiovascular: Normal rate and regular rhythm.  Pulmonary/Chest: Effort normal and breath sounds normal. No respiratory distress.  Abdominal: Soft. Bowel sounds are normal. He exhibits no distension. There is no abdominal tenderness. There is no rebound and no guarding.  Musculoskeletal: Normal range of motion.        General: Tenderness (right CVA/flank tenderness to palpation) present. No deformity or edema.  Lymphadenopathy:    He has no cervical adenopathy.  Neurological: He is alert and oriented to person, place, and time.  Psychiatric: His speech is normal and behavior is normal. His mood appears anxious.  Patient with some pre-occupation with exposure to Coronovirus. Also has had three prior office visits regarding hematuria yet.yet seemed unaware of results that had been discussed with him previously per past notes  Nursing note and vitals reviewed.   BP 121/71 (BP Location: Left Arm, Patient Position: Sitting, Cuff Size: Normal)   Pulse 79   Temp 98.9 F (37.2 C) (Oral)   Resp 18   Ht 6' (1.829 m)   Wt 241 lb (109.3 kg)   SpO2 96%   BMI 32.69 kg/m  Nurse's notes and vital signs reviewed Wt Readings from Last 3 Encounters:  11/09/18 241 lb (109.3 kg)  10/25/18 244 lb (110.7 kg)  10/03/18 241 lb (109.3 kg)       Health Maintenance Due  Topic Date Due  . PNEUMOCOCCAL POLYSACCHARIDE VACCINE AGE 66-64 HIGH RISK  07/27/1969  . TETANUS/TDAP  07/27/1986  . OPHTHALMOLOGY EXAM  12/12/2015  . FOOT EXAM  06/19/2016    There are no preventive care reminders to display for this patient.  Lab Results  Component Value Date   TSH 0.876 09/30/2014   Lab Results   Component Value Date   WBC 8.6 02/13/2016   HGB 14.4 02/13/2016   HCT 43.9 02/13/2016   MCV 90.1 02/13/2016   PLT 163 02/13/2016   Lab Results  Component Value Date   NA 141 06/09/2018   K 3.9 06/09/2018   CO2 25 06/09/2018   GLUCOSE 109 (H) 06/09/2018   BUN 10 06/09/2018   CREATININE 1.13 06/09/2018   BILITOT 0.5 06/09/2018   ALKPHOS 125 (H) 06/09/2018   AST 15 06/09/2018   ALT 20 06/09/2018   PROT 6.9 06/09/2018   ALBUMIN 4.5 06/09/2018   CALCIUM 9.3 06/09/2018   ANIONGAP 8 02/13/2016   Lab Results  Component Value Date   CHOL 188 06/09/2018   Lab Results  Component Value Date   HDL 40 06/09/2018   Lab Results  Component Value Date   LDLCALC 119 (H) 06/09/2018   Lab Results  Component Value Date   TRIG 143 06/09/2018   Lab Results  Component Value Date   CHOLHDL 4.7 06/09/2018   Lab Results  Component Value Date   HGBA1C 6.0 (A) 11/09/2018      Assessment & Plan:   1. Benign prostatic hyperplasia with urinary frequency I discussed with the patient that his renal ultrasound done on 09/29/2018 showed an enlarged prostate.  Patient had repeat urinalysis due to his complaints of nocturia/urinary frequency and patient with prior hematuria on urinalysis.  Because of his hematuria, patient is being referred to urology for further evaluation and treatment prior to any start of medication for treatment of BPH with lower urinary tract symptoms.  Patient was placed on Cipro 500 mg 1 tablet twice daily x10 days in case of prostatitis.  [name] urology referral was placed back in January by different provider but a new referral will be placed as patient was unable to be scheduled with first urology group which he was referred as there is no current upcoming appointment scheduled with urology - POCT URINALYSIS DIP (CLINITEK) - Ambulatory referral to Urology - ciprofloxacin (  CIPRO) 500 MG tablet; Take 1 tablet (500 mg total) by mouth 2 (two) times daily.  Dispense: 20  tablet; Refill: 0  2. Hematuria, unspecified type Patient with moderate hematuria on urinalysis done at today's visit.  Patient has had hematuria on multiple repeat urinalysis with exception of urinalysis done 06/16/2018 which did not show hematuria and urine culture showed mixed urogenital flora.  Patient is being referred to urology due to continued hematuria on urinalysis and since patient now with urinary frequency, patient will be placed on Cipro 500 mg twice daily x10 days to see if prostatitis may be playing a role in patient's urinary frequency- POCT URINALYSIS DIP (CLINITEK) - Ambulatory referral to Urology - ciprofloxacin (CIPRO) 500 MG tablet; Take 1 tablet (500 mg total) by mouth 2 (two) times daily.  Dispense: 20 tablet; Refill: 0  3. Controlled type 2 diabetes mellitus without complication, with long-term current use of insulin ([place]) Patient had hemoglobin A1c in January of 5.8.  Patient had office visit in February and patient was told that he could discontinue the use of metformin as his blood sugars were very well controlled.  At today's visit however patient's hemoglobin A1c was inadvertently rechecked by CMA and hemoglobin A1c was 6.0.  Since patient is also having increased urinary frequency and has had an increase in hemoglobin A1c, patient was encouraged to restart the use of metformin and new prescription provided at today's visit.  Patient is also asked to make sure that he adheres to a diabetic diet.  Patient reports that he does get adequate exercise on a daily basis as patient states that he does not have a car and therefore walks everywhere he needs to go and walks on a daily basis. - HgB.A1c - metFORMIN (GLUCOPHAGE) 500 MG tablet; Take 2 pills once per day after the evening meal  Dispense: 60 tablet; Refill: 5  4. Nocturia associated with benign prostatic hyperplasia Patient with complaint of urinary frequency with nocturia.  Patient had PSA done on 10/10/2018 which was normal  at 0.9.  Patient has had renal ultrasound showing enlargement of the prostate.  Patient likely with BPH with lower urinary tract symptoms however as patient also has had recurrent hematuria on urinalysis, patient is being referred to urology for further evaluation and treatment.  Handout on BPH provided to the patient as part of his AVS-after visit summary.  An After Visit Summary was printed and given to the patient.  Follow-up: Return in about 2 weeks (around 11/23/2018) for urinary frequency.   Antony Blackbird, MD

## 2018-11-09 NOTE — Patient Instructions (Signed)

## 2018-11-13 ENCOUNTER — Encounter: Payer: Self-pay | Admitting: Diagnostic Neuroimaging

## 2018-11-13 ENCOUNTER — Ambulatory Visit: Payer: No Typology Code available for payment source | Admitting: Diagnostic Neuroimaging

## 2018-11-13 ENCOUNTER — Telehealth: Payer: Self-pay | Admitting: *Deleted

## 2018-11-13 NOTE — Telephone Encounter (Signed)
Patient called this morning, stated he had no transportation to new patient appointment his morning. He reschedule for tomorrow.

## 2018-11-14 ENCOUNTER — Ambulatory Visit (INDEPENDENT_AMBULATORY_CARE_PROVIDER_SITE_OTHER): Payer: Self-pay | Admitting: Diagnostic Neuroimaging

## 2018-11-14 ENCOUNTER — Ambulatory Visit (HOSPITAL_COMMUNITY): Admission: RE | Admit: 2018-11-14 | Payer: Self-pay | Source: Ambulatory Visit

## 2018-11-14 ENCOUNTER — Encounter: Payer: Self-pay | Admitting: Diagnostic Neuroimaging

## 2018-11-14 VITALS — BP 111/70 | HR 77 | Ht 69.0 in | Wt 245.6 lb

## 2018-11-14 DIAGNOSIS — R200 Anesthesia of skin: Secondary | ICD-10-CM

## 2018-11-14 NOTE — Progress Notes (Signed)
[facility]  PATIENT: Dustin Hale DOB: May 07, 1966  REFERRING CLINICIAN: Fulp, Cammie HISTORY FROM: patient  REASON FOR VISIT: new consult    HISTORICAL  CHIEF COMPLAINT:  Chief Complaint  Patient presents with  . Numbness    rm 7, New Pt, "nerves tingling in fingers, arms, neck > 1 year"    HISTORY OF PRESENT ILLNESS:   53 year old male here for evaluation of numbness and tingling.  For past 1 to 2 years patient has had intermittent right greater than left hand numbness and tingling sensation from his fingertips radiating up his arms.  Symptoms have been worsening over time.  Sometimes he drops objects.  Sometimes he has sensations in his right shoulder and pectoral region.  When he is looking to a certain side, sometimes he feels abnormal sensations in his neck.  No problems with his feet or legs.  Patient has diabetes and is on medication.   REVIEW OF SYSTEMS: Full 14 system review of systems performed and negative with exception of: As per HPI.  ALLERGIES: No Known Allergies  HOME MEDICATIONS: Outpatient Medications Prior to Visit  Medication Sig Dispense Refill  . bisacodyl (BISACODYL) 5 MG EC tablet Take 5 mg by mouth once. Dulcolax 5 mg tab take as directed for colonoscopy prep.    . blood glucose meter kit and supplies KIT Dispense based on patient and insurance preference. Use up to four times daily as directed. (FOR ICD-9 250.00, 250.01). 1 each 2  . Blood Glucose Monitoring Suppl (TRUE METRIX METER) W/DEVICE KIT 1 each by Does not apply route as needed. 1 kit 0  . ciprofloxacin (CIPRO) 500 MG tablet Take 1 tablet (500 mg total) by mouth 2 (two) times daily. 20 tablet 0  . glucose blood (TRUE METRIX BLOOD GLUCOSE TEST) test strip 1 each by Other route 3 (three) times daily. 100 each 11  . metFORMIN (GLUCOPHAGE) 500 MG tablet Take 2 pills once per day after the evening meal 60 tablet 5  . simvastatin (ZOCOR) 20 MG tablet One pill by mouth each  evening to help lower cholesterol 30 tablet 11  . TRUEPLUS LANCETS 28G MISC 1 each by Does not apply route 3 (three) times daily. 100 each 11   No facility-administered medications prior to visit.     PAST MEDICAL HISTORY: Past Medical History:  Diagnosis Date  . Diabetes mellitus without complication ([place])   . Hyperlipidemia   . Obesity   . Poor dental hygiene     PAST SURGICAL HISTORY: Past Surgical History:  Procedure Laterality Date  . NO PAST SURGERIES      FAMILY HISTORY: Family History  Problem Relation Age of Onset  . Hypertension Mother   . Other Mother        fire  . Hypertension Father   . Other Father        car crash  . Hypertension Maternal Grandmother   . Heart attack Maternal Grandmother   . Arthritis Maternal Grandmother   . Hypertension Maternal Grandfather   . Hypertension Paternal Grandmother   . Stroke Other   . Colon cancer Neg Hx   . Rectal cancer Neg Hx   . Stomach cancer Neg Hx     SOCIAL HISTORY: Social History   Socioeconomic History  . Marital status: Single    Spouse name: Not on file  . Number of children: 4  . Years of education: 2 yrs college  . Highest education level: Not on file  Occupational History  .  Not on file  Social Needs  . Financial resource strain: Not on file  . Food insecurity:    Worry: Not on file    Inability: Not on file  . Transportation needs:    Medical: Not on file    Non-medical: Not on file  Tobacco Use  . Smoking status: Former Smoker    Packs/day: 0.33    Years: 32.00    Pack years: 10.56    Types: Cigarettes  . Smokeless tobacco: Never Used  . Tobacco comment: quit smoking on 08/27/14  Substance and Sexual Activity  . Alcohol use: Yes    Alcohol/week: 0.0 standard drinks    Comment: occasional   . Drug use: Not Currently    Types: Marijuana    Comment: Last use August 2019  . Sexual activity: Yes    Partners: Female  Lifestyle  . Physical activity:    Days per week: Not on file      Minutes per session: Not on file  . Stress: Not on file  Relationships  . Social connections:    Talks on phone: Not on file    Gets together: Not on file    Attends religious service: Not on file    Active member of club or organization: Not on file    Attends meetings of clubs or organizations: Not on file    Relationship status: Not on file  . Intimate partner violence:    Fear of current or ex partner: Not on file    Emotionally abused: Not on file    Physically abused: Not on file    Forced sexual activity: Not on file  Other Topics Concern  . Not on file  Social History Narrative   Lives with sig other   No caffeine     PHYSICAL EXAM  GENERAL EXAM/CONSTITUTIONAL: Vitals:  Vitals:   11/14/18 1516  BP: 111/70  Pulse: 77  Weight: 245 lb 9.6 oz (111.4 kg)  Height: 5' 9"  (1.753 m)     Body mass index is 36.27 kg/m. Wt Readings from Last 3 Encounters:  11/14/18 245 lb 9.6 oz (111.4 kg)  11/09/18 241 lb (109.3 kg)  10/25/18 244 lb (110.7 kg)     Patient is in no distress; well developed, nourished and groomed; neck is supple  CARDIOVASCULAR:  Examination of carotid arteries is normal; no carotid bruits  Regular rate and rhythm, no murmurs  Examination of peripheral vascular system by observation and palpation is normal  EYES:  Ophthalmoscopic exam of optic discs and posterior segments is normal; no papilledema or hemorrhages  Visual Acuity Screening   Right eye Left eye Both eyes  Without correction: 20/30 20/30   With correction:        MUSCULOSKELETAL:  Gait, strength, tone, movements noted in Neurologic exam below  NEUROLOGIC: MENTAL STATUS:  No flowsheet data found.  awake, alert, oriented to person, place and time  recent and remote memory intact  normal attention and concentration  language fluent, comprehension intact, naming intact  fund of knowledge appropriate  CRANIAL NERVE:   2nd - no papilledema on fundoscopic  exam  2nd, 3rd, 4th, 6th - pupils equal and reactive to light, visual fields full to confrontation, extraocular muscles intact, no nystagmus  5th - facial sensation symmetric  7th - facial strength symmetric  8th - hearing intact  9th - palate elevates symmetrically, uvula midline  11th - shoulder shrug symmetric  12th - tongue protrusion midline  MOTOR:  normal bulk and tone, full strength in the BUE, BLE  SENSORY:   normal and symmetric to light touch, temperature, vibration  PHALENS SIGN BORDERLINE  COORDINATION:   finger-nose-finger, fine finger movements normal  REFLEXES:   deep tendon reflexes present and symmetric  GAIT/STATION:   narrow based gait    DIAGNOSTIC DATA (LABS, IMAGING, TESTING) - I reviewed patient records, labs, notes, testing and imaging myself where available.  Lab Results  Component Value Date   WBC 8.6 02/13/2016   HGB 14.4 02/13/2016   HCT 43.9 02/13/2016   MCV 90.1 02/13/2016   PLT 163 02/13/2016      Component Value Date/Time   NA 141 06/09/2018 0922   K 3.9 06/09/2018 0922   CL 104 06/09/2018 0922   CO2 25 06/09/2018 0922   GLUCOSE 109 (H) 06/09/2018 0922   GLUCOSE 88 09/30/2016 1631   BUN 10 06/09/2018 0922   CREATININE 1.13 06/09/2018 0922   CREATININE 0.86 09/30/2016 1631   CALCIUM 9.3 06/09/2018 0922   PROT 6.9 06/09/2018 0922   ALBUMIN 4.5 06/09/2018 0922   AST 15 06/09/2018 0922   ALT 20 06/09/2018 0922   ALKPHOS 125 (H) 06/09/2018 0922   BILITOT 0.5 06/09/2018 0922   GFRNONAA 75 06/09/2018 0922   GFRNONAA >89 09/30/2016 1631   GFRAA 87 06/09/2018 0922   GFRAA >89 09/30/2016 1631   Lab Results  Component Value Date   CHOL 188 06/09/2018   HDL 40 06/09/2018   LDLCALC 119 (H) 06/09/2018   TRIG 143 06/09/2018   CHOLHDL 4.7 06/09/2018   Lab Results  Component Value Date   HGBA1C 6.0 (A) 11/09/2018   No results found for: [id number] Lab Results  Component Value Date   TSH 0.876 09/30/2014       ASSESSMENT AND PLAN  53 y.o. year old male here with:   Dx:  1. Hand numbness      PLAN:  - check EMG/NCS (eval for CTS vs cervical radiculopathy); then may check MRI cervical spine  Orders Placed This Encounter  Procedures  . NCV with EMG(electromyography)   Return for for NCV/EMG.    Penni Bombard, MD 3/66/2947, 6:54 PM Certified in Neurology, Neurophysiology and Neuroimaging  [facility] [address] [place], [place] [address] [phone]

## 2018-11-15 ENCOUNTER — Ambulatory Visit (HOSPITAL_COMMUNITY)
Admission: RE | Admit: 2018-11-15 | Discharge: 2018-11-15 | Disposition: A | Discharge status: Home / Self Care | Payer: Self-pay | Source: Ambulatory Visit | Attending: Urology | Admitting: Urology

## 2018-11-15 ENCOUNTER — Other Ambulatory Visit: Payer: Self-pay

## 2018-11-15 DIAGNOSIS — R3129 Other microscopic hematuria: Secondary | ICD-10-CM | POA: Insufficient documentation

## 2018-11-15 LAB — POCT I-STAT CREATININE: Creatinine, Ser: 1.2 mg/dL (ref 0.61–1.24)

## 2018-11-15 MED ORDER — SODIUM CHLORIDE (PF) 0.9 % IJ SOLN
INTRAMUSCULAR | Status: AC
  Filled 2018-11-15: qty 50

## 2018-11-15 MED ORDER — IOHEXOL 300 MG/ML  SOLN
100.0000 mL | Freq: Once | INTRAMUSCULAR | Status: AC | PRN
  Administered 2018-11-15: 100 mL via INTRAVENOUS

## 2018-11-15 MED ORDER — SODIUM CHLORIDE 0.9 % IV SOLN
INTRAVENOUS | Status: AC
  Filled 2018-11-15: qty 250

## 2018-11-16 ENCOUNTER — Ambulatory Visit (HOSPITAL_COMMUNITY): Payer: Self-pay

## 2018-12-07 ENCOUNTER — Telehealth: Payer: Self-pay

## 2018-12-07 NOTE — Telephone Encounter (Signed)
Due to current COVID 19 pandemic, our office is severely reducing in office visits for at least the next 2 weeks, in order to minimize the risk to our patients and healthcare providers.   I called and left a detailed message for patient making him aware that the NCS has been canceled and that I will call in a couple of weeks to reschedule.

## 2018-12-08 ENCOUNTER — Other Ambulatory Visit: Payer: Self-pay

## 2018-12-08 ENCOUNTER — Ambulatory Visit: Payer: Self-pay | Attending: Family Medicine | Admitting: Family Medicine

## 2018-12-08 DIAGNOSIS — Z5321 Procedure and treatment not carried out due to patient leaving prior to being seen by health care provider: Secondary | ICD-10-CM

## 2018-12-14 ENCOUNTER — Encounter: Payer: Self-pay | Admitting: Diagnostic Neuroimaging

## 2019-01-16 ENCOUNTER — Telehealth: Payer: Self-pay

## 2019-01-16 NOTE — Telephone Encounter (Signed)
I called and left a voicemail for patient asking that he give Dustin Hale a call back so that we can reschedule the nerve conduction study that we had to cancel on 4/9 with Dr. Leta Baptist.

## 2019-02-03 ENCOUNTER — Other Ambulatory Visit: Payer: Self-pay | Admitting: Family Medicine

## 2019-03-30 ENCOUNTER — Telehealth: Payer: Self-pay

## 2019-03-30 NOTE — Telephone Encounter (Signed)
Pt contacted the office and stated that he was seen back on 11/09/18 for blood in his urine. Pt states he has not heard anything from his doctor regarding this matter and he is still urinating blood. Patient had an appointment with [facility] back on 12/08/18 at 930am   Per Alinda Sierras pt no showed to appointment. Also he will have to wait 6 mos to be referred again if he is referred through he orange card program if not he will have to pay out of pocket

## 2019-03-30 NOTE — Telephone Encounter (Signed)
When staff called patient and informed him with information from provider, patient immediately started yelling yes he did go to the appt and he had test done and was told he was going to be called with those results and he was going to be scheduled with another place and nothing has been done. Per pt "that's a lie. Staff then put patient on hold and called [facility] to get better clarification due to this all starting back in March and this is now July. Spoke with [facility] staff and was informed that patient did have a CT completed on 11-15-2018 and was suppose to follow up with them on December 08, 2018 but patient no showed. Per staff from [facility], a no show letter was then mailed out to patient informing him that he no showed his appt and to reach back out to office. Staff got back on the phone with patient and informed him with this information and started to inform him again with what provider stated. During the entire conversation, patient was yelling and stating that the information that was given to him was all wrong and he will not be playing with his health and he's been going through this since March. Per pt he stated this is "B..S..." and he's not intending on yelling at staff but nothing is being done and they don't care. Staff tried to apologized for any miscommunication or misunderstanding but due to him still having this same issues, provider would like him to please go to Urgent Care or ED. Patient then got even more upset and stated he's already been through all of this and this is some "B..S..." and started repeating himself again about how everything is a lie and he's not going to play with his health and patient hung up on staff. Staff called patient back trying to plea with patient in the advise that provider stated and patient again started repeating what he previously stated and hung back up on staff.

## 2019-03-30 NOTE — Telephone Encounter (Signed)
Patient should go to urgent care or ED today or this weekend in follow-up of the blood in his urine and schedule office follow-up. It appears that he missed the appointment with the specialists in follow-up of the blood in his urine

## 2019-04-09 ENCOUNTER — Telehealth: Payer: Self-pay | Admitting: Family Medicine

## 2019-04-09 ENCOUNTER — Other Ambulatory Visit: Payer: Self-pay | Admitting: Family Medicine

## 2019-04-09 DIAGNOSIS — N400 Enlarged prostate without lower urinary tract symptoms: Secondary | ICD-10-CM

## 2019-04-09 DIAGNOSIS — R319 Hematuria, unspecified: Secondary | ICD-10-CM

## 2019-04-09 NOTE — Telephone Encounter (Signed)
Patient called yelling about blood in his urin since Feb. And it is now aug. Patient is  requesting a call back from the provider. I offered the pt and appointment he refused.

## 2019-04-09 NOTE — Telephone Encounter (Signed)
Spoke with patient and informed him with what provider stated and he verbalized understanding. Staff gived patient [facility] number to call them just in case they don't call him back in time for him to call them to schedule his own appt. Patient verbalized understanding and agreed.

## 2019-04-09 NOTE — Progress Notes (Signed)
Patient ID: Dustin Hale, male   DOB: 1966-07-10, 53 y.o.   MRN: [id number]   Patient with hematuria on UA in March of 2020. Patient has had past issues with hematuria as well as nocturia and BPH. He was referred to Urology in March of this year but does not appear to have kept that appointment. Patient has called this am to complain of lack of follow-up of his hematuria. He will be contacted and informed that new appointment has been placed for Urology appointment and in the interim, he will be asked to follow-up in the office as well as he is overdue for follow-up of other chronic medical issues.

## 2019-04-09 NOTE — Telephone Encounter (Signed)
LMOM

## 2019-04-09 NOTE — Telephone Encounter (Signed)
Pt said Urology wont see him with his expired orange card, made him a financial appointment to renew. pt understood however he requested a call back from his nurse again.

## 2019-04-09 NOTE — Telephone Encounter (Signed)
Please contact patient and let him know that he was referred to Urology after his appointment in March in follow-up of his enlarged prostate and hematuria as discussed at his appointment back in March. I have placed a new referral for him to follow-up with Urology and I would like him to reschedule an office visit in follow-up of his hematuria as well as his other chronic medical issues.

## 2019-04-10 NOTE — Telephone Encounter (Signed)
Spoke with patient and he starts verbally rising his voice at staff stating that since March it's taking him this long to get an appt with the Urology and he still don't have an appt. Per pt he could have been dead by now. Per pt he stated that no one told him what to do about the blood in his urine and no one told him why there was blood in his urine. Staff apologized to patient again to patient and offered him an appt with the provider to discuss his issues and concerns. Patient agree and scheduled the appt.

## 2019-04-10 NOTE — Telephone Encounter (Signed)
During conversation with patient, patient did mention he is still having blood in his urine. Staff then stated to patient that if he is still having blood in his urine to go to the Emergency Department and patient stated well there is not visible blood.

## 2019-04-19 ENCOUNTER — Ambulatory Visit: Payer: Self-pay

## 2019-04-27 ENCOUNTER — Ambulatory Visit: Payer: Self-pay | Admitting: Family Medicine

## 2019-04-30 ENCOUNTER — Telehealth: Payer: Self-pay | Admitting: Family Medicine

## 2019-04-30 ENCOUNTER — Ambulatory Visit: Payer: Self-pay | Admitting: Family Medicine

## 2019-04-30 NOTE — Telephone Encounter (Signed)
Call returned to patient but no answer therefore message left on machine that he can call office again or call and ask for referral coordinator regarding location of his Urology appointment

## 2019-04-30 NOTE — Telephone Encounter (Signed)
Patient wants to speak with provider only. Patient would like to speak with PCP.

## 2019-04-30 NOTE — Telephone Encounter (Signed)
Patient called to check on the status of his urology referral and why he needs to be seen in [place]. Patient requested to speak to PCP. Please follow up.

## 2019-04-30 NOTE — Telephone Encounter (Signed)
See patient phone calls.  Patient left message 04/30/2019 questioning why his urology appointment was in [facility]  Patient would not leave any further information or discussed the reason for the call with medical assistant.  Call was returned to the patient but there was no answer and therefore message left on machine for patient to call the office to speak with the referral coordinator regarding location of his appointment.

## 2019-05-01 NOTE — Telephone Encounter (Signed)
Called patient to inform him with this information from provider and patient still would like provider to call him back again. Per pt he will not miss this call again.

## 2019-05-04 ENCOUNTER — Telehealth: Payer: Self-pay | Admitting: *Deleted

## 2019-05-04 ENCOUNTER — Telehealth: Payer: Self-pay | Admitting: Family Medicine

## 2019-05-04 NOTE — Telephone Encounter (Signed)
Patient calling to see if provider to please give him a call. Per pt he received the first call from the provider and still have not received a second call. Patient wants to call provider

## 2019-05-04 NOTE — Telephone Encounter (Signed)
Patient called earlier this week and left message that he wanted a follow-up call from provider. Patient did not answer and message left on voicemail. Call was returned again today following patient care and call was not answered. Message was left on machine that patient can call back on Monday but may need to schedule an appointment to address any concerns.

## 2019-05-04 NOTE — Telephone Encounter (Signed)
noted 

## 2019-05-04 NOTE — Telephone Encounter (Signed)
Call placed to patient on Friday August 28th at 5:16 pm and no answer. Message was left on voicemail for patient to call the office on Monday or please schedule an office visit with any concerns.

## 2019-05-04 NOTE — Telephone Encounter (Signed)
I called patient again today and call was not answered by patient and message left on voicemail that I had called and he can call the office on Monday but may need to make an appointment to address his concerns.

## 2019-05-07 NOTE — Telephone Encounter (Signed)
Please call patient back and advise him to go to the ED if he has an acute problem/medical issue. I will also send to [facility] and patient can be triaged to see if he needs to be seen as a work-in with another provider.

## 2019-05-07 NOTE — Telephone Encounter (Signed)
Called patient and an appt was made so he can verbalized his concerns to provider. Patient was very upset on the phone with increased voice stating that provider is playing and hope he don't be dead by the time he comes in.

## 2019-05-07 NOTE — Telephone Encounter (Signed)
Spoke with patient and an appt was made

## 2019-05-21 ENCOUNTER — Telehealth: Payer: Self-pay

## 2019-05-21 ENCOUNTER — Ambulatory Visit: Payer: Self-pay | Admitting: Urology

## 2019-05-21 NOTE — Telephone Encounter (Signed)
Pt contacted the office and requested a refill on Cipro 500mg  sent o [facility]

## 2019-05-21 NOTE — Telephone Encounter (Signed)
Patient was supposed to have an appointment today with Urology in [place]- can you call their office and see if he came to this appointment, rescheduled or no showed

## 2019-05-22 NOTE — Telephone Encounter (Signed)
Will forward to nurse 

## 2019-05-22 NOTE — Telephone Encounter (Signed)
Per pt's chart, it looks like patient cancelled his appt with the Urology that was scheduled for 05-21-19 and it is rescheduled for 06-25-19.

## 2019-05-22 NOTE — Telephone Encounter (Signed)
Can you call patient and place him on the schedule tomorrow or Thursday for telemedicine visit

## 2019-05-22 NOTE — Telephone Encounter (Signed)
Patient is already scheduled for appt on 05-24-19 for in person due to patient request. Should staff change it to telemed?

## 2019-05-22 NOTE — Telephone Encounter (Signed)
No, in-person visit is fine

## 2019-05-22 NOTE — Telephone Encounter (Signed)
ok 

## 2019-05-24 ENCOUNTER — Ambulatory Visit: Payer: Self-pay | Attending: Family Medicine | Admitting: Family Medicine

## 2019-05-24 ENCOUNTER — Encounter: Payer: Self-pay | Admitting: Family Medicine

## 2019-05-24 ENCOUNTER — Ambulatory Visit: Payer: Self-pay | Attending: Family Medicine | Admitting: Pharmacist

## 2019-05-24 ENCOUNTER — Other Ambulatory Visit: Payer: Self-pay

## 2019-05-24 VITALS — BP 103/73 | HR 69 | Temp 98.5°F | Resp 16 | Wt 244.0 lb

## 2019-05-24 DIAGNOSIS — N401 Enlarged prostate with lower urinary tract symptoms: Secondary | ICD-10-CM

## 2019-05-24 DIAGNOSIS — R319 Hematuria, unspecified: Secondary | ICD-10-CM

## 2019-05-24 DIAGNOSIS — R350 Frequency of micturition: Secondary | ICD-10-CM

## 2019-05-24 DIAGNOSIS — Z23 Encounter for immunization: Secondary | ICD-10-CM

## 2019-05-24 DIAGNOSIS — E119 Type 2 diabetes mellitus without complications: Secondary | ICD-10-CM

## 2019-05-24 DIAGNOSIS — Z794 Long term (current) use of insulin: Secondary | ICD-10-CM

## 2019-05-24 LAB — POCT GLYCOSYLATED HEMOGLOBIN (HGB A1C): HbA1c, POC (controlled diabetic range): 6.6 % (ref 0.0–7.0)

## 2019-05-24 LAB — GLUCOSE, POCT (MANUAL RESULT ENTRY): POC Glucose: 163 mg/dL — AB (ref 70–99)

## 2019-05-24 MED ORDER — CIPROFLOXACIN HCL 500 MG PO TABS: 500.0000 mg | ORAL_TABLET | Freq: Two times a day (BID) | ORAL | 0 refills | Status: DC

## 2019-05-24 MED FILL — CIPROFLOXACIN HCL 500 MG TA: 500 | 10 days supply | Qty: 20 | Fill #0

## 2019-05-24 NOTE — Progress Notes (Signed)
Patient presents for vaccination against influenza per orders of Dr. Fulp. Consent given. Counseling provided. No contraindications exists. Vaccine administered without incident.   

## 2019-05-24 NOTE — Progress Notes (Signed)
Established Patient Office Visit  Subjective:  Patient ID: Dustin Hale, male    DOB: May 21, 1966  Age: 53 y.o. MRN: [id number]  CC:  Chief Complaint  Patient presents with  . Diabetes    HPI Dustin Hale presents for follow-up of diabetes with last Hgb A1c of 6.0 on 11/09/2018.  He feels that his blood sugars remain well controlled.  He denies any increased thirst or blurred vision.  He has had some recent increase over the past 2 to 3 weeks of increased urination which mostly occurs at night.  He does report a history of an enlarged prostate.  He is not having any discomfort with urination.  He is interested in seeing if he can stop the use of metformin since his blood sugars have been well controlled.  He denies any actual issues such as GI upset or diarrhea with the use of Metformin.  He states that he does not like to be on a lot of medications and just wonders if he would be able to stop the Metformin.  Past Medical History:  Diagnosis Date  . Diabetes mellitus without complication ([place])   . Hyperlipidemia   . Obesity   . Poor dental hygiene     Past Surgical History:  Procedure Laterality Date  . NO PAST SURGERIES      Family History  Problem Relation Age of Onset  . Hypertension Mother   . Other Mother        fire  . Hypertension Father   . Other Father        car crash  . Hypertension Maternal Grandmother   . Heart attack Maternal Grandmother   . Arthritis Maternal Grandmother   . Hypertension Maternal Grandfather   . Hypertension Paternal Grandmother   . Stroke Other   . Colon cancer Neg Hx   . Rectal cancer Neg Hx   . Stomach cancer Neg Hx     Social History   Socioeconomic History  . Marital status: Single    Spouse name: Not on file  . Number of children: 4  . Years of education: 2 yrs college  . Highest education level: Not on file  Occupational History  . Not on file  Social Needs  . Financial resource strain: Not on file  . Food insecurity     Worry: Not on file    Inability: Not on file  . Transportation needs    Medical: Not on file    Non-medical: Not on file  Tobacco Use  . Smoking status: Former Smoker    Packs/day: 0.33    Years: 32.00    Pack years: 10.56    Types: Cigarettes  . Smokeless tobacco: Never Used  . Tobacco comment: quit smoking on 08/27/14  Substance and Sexual Activity  . Alcohol use: Yes    Alcohol/week: 0.0 standard drinks    Comment: occasional   . Drug use: Not Currently    Types: Marijuana    Comment: Last use August 2019  . Sexual activity: Yes    Partners: Female  Lifestyle  . Physical activity    Days per week: Not on file    Minutes per session: Not on file  . Stress: Not on file  Relationships  . Social Herbalist on phone: Not on file    Gets together: Not on file    Attends religious service: Not on file    Active member of club or organization: Not on file  Attends meetings of clubs or organizations: Not on file    Relationship status: Not on file  . Intimate partner violence    Fear of current or ex partner: Not on file    Emotionally abused: Not on file    Physically abused: Not on file    Forced sexual activity: Not on file  Other Topics Concern  . Not on file  Social History Narrative   Lives with sig other   No caffeine    Outpatient Medications Prior to Visit  Medication Sig Dispense Refill  . bisacodyl (BISACODYL) 5 MG EC tablet Take 5 mg by mouth once. Dulcolax 5 mg tab take as directed for colonoscopy prep.    . blood glucose meter kit and supplies KIT Dispense based on patient and insurance preference. Use up to four times daily as directed. (FOR ICD-9 250.00, 250.01). 1 each 2  . Blood Glucose Monitoring Suppl (TRUE METRIX METER) W/DEVICE KIT 1 each by Does not apply route as needed. 1 kit 0  . ciprofloxacin (CIPRO) 500 MG tablet Take 1 tablet (500 mg total) by mouth 2 (two) times daily. 20 tablet 0  . glucose blood (TRUE METRIX BLOOD GLUCOSE  TEST) test strip 1 each by Other route 3 (three) times daily. 100 each 11  . metFORMIN (GLUCOPHAGE) 500 MG tablet Take 2 pills once per day after the evening meal 60 tablet 5  . simvastatin (ZOCOR) 20 MG tablet One pill by mouth each evening to help lower cholesterol 30 tablet 11  . TRUEPLUS LANCETS 28G MISC 1 each by Does not apply route 3 (three) times daily. 100 each 11   No facility-administered medications prior to visit.     No Known Allergies  ROS Review of Systems  Constitutional: Negative for chills, fatigue and fever.  HENT: Negative for sore throat and trouble swallowing.   Eyes: Negative for photophobia and visual disturbance.  Respiratory: Negative for cough and shortness of breath.   Cardiovascular: Negative for chest pain, palpitations and leg swelling.  Gastrointestinal: Negative for abdominal pain, blood in stool, constipation, diarrhea and nausea.  Endocrine: Negative for polydipsia, polyphagia and polyuria.  Genitourinary: Positive for frequency (Recent onset, usually at night). Negative for difficulty urinating, dysuria and flank pain.  Musculoskeletal: Negative for arthralgias and back pain.  Neurological: Negative for dizziness and headaches.  Hematological: Negative for adenopathy. Does not bruise/bleed easily.      Objective:    Physical Exam  Constitutional: He is oriented to person, place, and time. He appears well-developed and well-nourished.  Neck: Normal range of motion. Neck supple. No JVD present. No thyromegaly present.  Cardiovascular: Normal rate and regular rhythm.  Pulmonary/Chest: Effort normal and breath sounds normal.  Abdominal: Soft. There is no abdominal tenderness. There is no rebound.  Musculoskeletal: Normal range of motion.        General: No tenderness or edema.  Lymphadenopathy:    He has no cervical adenopathy.  Neurological: He is alert and oriented to person, place, and time.  Skin: Skin is warm and dry.  No active skin  breakdown on the feet  Psychiatric: He has a normal mood and affect. His behavior is normal.  Vitals reviewed.   BP 103/73   Pulse 69   Temp 98.5 F (36.9 C) (Oral)   Resp 16   Wt 244 lb (110.7 kg)   SpO2 97%   BMI 36.03 kg/m  Wt Readings from Last 3 Encounters:  05/24/19 244 lb (110.7 kg)  11/14/18  245 lb 9.6 oz (111.4 kg)  11/09/18 241 lb (109.3 kg)     Health Maintenance Due  Topic Date Due  . OPHTHALMOLOGY EXAM  12/12/2015  . FOOT EXAM  06/19/2016  . INFLUENZA VACCINE  04/07/2019  . HEMOGLOBIN A1C  05/12/2019  . URINE MICROALBUMIN  06/10/2019     Lab Results  Component Value Date   TSH 0.876 09/30/2014   Lab Results  Component Value Date   WBC 8.6 02/13/2016   HGB 14.4 02/13/2016   HCT 43.9 02/13/2016   MCV 90.1 02/13/2016   PLT 163 02/13/2016   Lab Results  Component Value Date   NA 141 06/09/2018   K 3.9 06/09/2018   CO2 25 06/09/2018   GLUCOSE 109 (H) 06/09/2018   BUN 10 06/09/2018   CREATININE 1.20 11/15/2018   BILITOT 0.5 06/09/2018   ALKPHOS 125 (H) 06/09/2018   AST 15 06/09/2018   ALT 20 06/09/2018   PROT 6.9 06/09/2018   ALBUMIN 4.5 06/09/2018   CALCIUM 9.3 06/09/2018   ANIONGAP 8 02/13/2016   Lab Results  Component Value Date   CHOL 188 06/09/2018   Lab Results  Component Value Date   HDL 40 06/09/2018   Lab Results  Component Value Date   LDLCALC 119 (H) 06/09/2018   Lab Results  Component Value Date   TRIG 143 06/09/2018   Lab Results  Component Value Date   CHOLHDL 4.7 06/09/2018   Lab Results  Component Value Date   HGBA1C 6.6 05/24/2019      Assessment & Plan:  1. Controlled type 2 diabetes mellitus without complication, with long-term current use of insulin (HCC) [name] glucose was 163 at today's visit and hemoglobin A1c done in follow-up of diabetes and was good at 6.6.  [name] blood sugars remain well controlled.  Patient will have urinalysis, at today's visit in addition to hemoglobin A1c.  As he is  nonfasting, he will return for lipid panel, and comprehensive metabolic panel.  He is interested in seeing if he can control his diabetes with diet only.  Discussed with patient that he may wish to continue metformin to help with insulin resistance and control of diabetes.  He has also been referred for diabetic eye exam.  Diabetic foot care discussed at today's visit. - POCT glucose (manual entry) - POCT glycosylated hemoglobin (Hb A1C) - POCT URINALYSIS DIP (CLINITEK) - Lipid panel; Future - Comprehensive metabolic panel; Future - Ambulatory referral to Ophthalmology  2. Benign prostatic hyperplasia with urinary frequency Patient reports a history of BPH and reports some recent increase in urinary frequency.  He will have urinalysis done at today's visit.  In the interim, he will be placed on Cipro in case he is having issues with prostatitis or epididymitis as the cause of his symptoms while urine culture is pending. - POCT URINALYSIS DIP (CLINITEK) - ciprofloxacin (CIPRO) 500 MG tablet; Take 1 tablet (500 mg total) by mouth 2 (two) times daily.  Dispense: 20 tablet; Refill: 0  3. Hematuria, unspecified type Patient with hematuria on urinalysis.  He will be placed on Cipro twice daily x10 days.  Patient will return in 1 to 2 weeks for fasting blood work and will have repeat urinalysis at that time and follow-up of hematuria.  If he continues to have hematuria on repeat urinalysis, he will need referral to urology for further evaluation and treatment. - ciprofloxacin (CIPRO) 500 MG tablet; Take 1 tablet (500 mg total) by mouth 2 (two) times daily.  Dispense: 20 tablet; Refill: 0 9  An After Visit Summary was printed and given to the patient.  Follow-up: Return in about 5 months (around 10/24/2019) for DM/lipids- fasting labs in 1-2 weeks.    Antony Blackbird, MD

## 2019-05-24 NOTE — Patient Instructions (Addendum)
Carbohydrate Counting for Diabetes Mellitus, Adult  Carbohydrate counting is a method of keeping track of how many carbohydrates you eat. Eating carbohydrates naturally increases the amount of sugar (glucose) in the blood. Counting how many carbohydrates you eat helps keep your blood glucose within normal limits, which helps you manage your diabetes (diabetes mellitus). It is important to know how many carbohydrates you can safely have in each meal. This is different for every person. A diet and nutrition specialist (registered dietitian) can help you make a meal plan and calculate how many carbohydrates you should have at each meal and snack. Carbohydrates are found in the following foods:  Grains, such as breads and cereals.  Dried beans and soy products.  Starchy vegetables, such as potatoes, peas, and corn.  Fruit and fruit juices.  Milk and yogurt.  Sweets and snack foods, such as cake, cookies, candy, chips, and soft drinks. How do I count carbohydrates? There are two ways to count carbohydrates in food. You can use either of the methods or a combination of both. Reading "Nutrition Facts" on packaged food The "Nutrition Facts" list is included on the labels of almost all packaged foods and beverages in the [place] It includes:  The serving size.  Information about nutrients in each serving, including the grams (g) of carbohydrate per serving. To use the "Nutrition Facts":  Decide how many servings you will have.  Multiply the number of servings by the number of carbohydrates per serving.  The resulting number is the total amount of carbohydrates that you will be having. Learning standard serving sizes of other foods When you eat carbohydrate foods that are not packaged or do not include "Nutrition Facts" on the label, you need to measure the servings in order to count the amount of carbohydrates:  Measure the foods that you will eat with a food scale or measuring cup, if  needed.  Decide how many standard-size servings you will eat.  Multiply the number of servings by 15. Most carbohydrate-rich foods have about 15 g of carbohydrates per serving. ? For example, if you eat 8 oz (170 g) of strawberries, you will have eaten 2 servings and 30 g of carbohydrates (2 servings x 15 g = 30 g).  For foods that have more than one food mixed, such as soups and casseroles, you must count the carbohydrates in each food that is included. The following list contains standard serving sizes of common carbohydrate-rich foods. Each of these servings has about 15 g of carbohydrates:   hamburger bun or  English muffin.   oz (15 mL) syrup.   oz (14 g) jelly.  1 slice of bread.  1 six-inch tortilla.  3 oz (85 g) cooked rice or pasta.  4 oz (113 g) cooked dried beans.  4 oz (113 g) starchy vegetable, such as peas, corn, or potatoes.  4 oz (113 g) hot cereal.  4 oz (113 g) mashed potatoes or  of a large baked potato.  4 oz (113 g) canned or frozen fruit.  4 oz (120 mL) fruit juice.  4-6 crackers.  6 chicken nuggets.  6 oz (170 g) unsweetened dry cereal.  6 oz (170 g) plain fat-free yogurt or yogurt sweetened with artificial sweeteners.  8 oz (240 mL) milk.  8 oz (170 g) fresh fruit or one small piece of fruit.  24 oz (680 g) popped popcorn. Example of carbohydrate counting Sample meal  3 oz (85 g) chicken breast.  6 oz (170 g)   brown rice.  4 oz (113 g) corn.  8 oz (240 mL) milk.  8 oz (170 g) strawberries with sugar-free whipped topping. Carbohydrate calculation 1. Identify the foods that contain carbohydrates: ? Rice. ? Corn. ? Milk. ? Strawberries. 2. Calculate how many servings you have of each food: ? 2 servings rice. ? 1 serving corn. ? 1 serving milk. ? 1 serving strawberries. 3. Multiply each number of servings by 15 g: ? 2 servings rice x 15 g = 30 g. ? 1 serving corn x 15 g = 15 g. ? 1 serving milk x 15 g = 15 g. ? 1  serving strawberries x 15 g = 15 g. 4. Add together all of the amounts to find the total grams of carbohydrates eaten: ? 30 g + 15 g + 15 g + 15 g = 75 g of carbohydrates total. Summary  Carbohydrate counting is a method of keeping track of how many carbohydrates you eat.  Eating carbohydrates naturally increases the amount of sugar (glucose) in the blood.  Counting how many carbohydrates you eat helps keep your blood glucose within normal limits, which helps you manage your diabetes.  A diet and nutrition specialist (registered dietitian) can help you make a meal plan and calculate how many carbohydrates you should have at each meal and snack. This information is not intended to replace advice given to you by your health care provider. Make sure you discuss any questions you have with your health care provider. Document Released: 08/23/2005 Document Revised: 03/17/2017 Document Reviewed: 02/04/2016 Elsevier Patient Education  2020 [place] With Diabetes Diabetes (type 1 diabetes mellitus or type 2 diabetes mellitus) is a condition in which the body does not have enough of a hormone called insulin, or the body does not respond properly to insulin. Normally, insulin allows sugars (glucose) to enter cells in the body. The cells use glucose for energy. With diabetes, extra glucose builds up in the blood instead of going into cells, which results in high blood glucose (hyperglycemia). How to manage lifestyle changes Managing diabetes includes medical treatments as well as lifestyle changes. If diabetes is not managed well, serious physical and emotional complications can occur. Taking good care of yourself means that you are responsible for:  Monitoring glucose regularly.  Eating a healthy diet.  Exercising regularly.  Meeting with health care providers.  Taking medicines as directed. Some people may feel a lot of stress about managing their diabetes. This is known as  emotional distress, and it is very common. Living with diabetes can place you at risk for emotional distress, depression, or anxiety. These disorders can be confusing and can make diabetes management more difficult. How to recognize stress Emotional distress Symptoms of emotional distress include:  Anger about having a diagnosis of diabetes.  Fear or frustration about your diagnosis and the changes you need to make to manage the condition.  Being overly worried about the care that you need or the cost of the care that you need.  Feeling like you caused your condition by doing something wrong.  Fear of unpredictable situations, like low or high blood glucose.  Feeling judged by your health care providers.  Feeling very alone with the disease.  Getting too tired or worn out with the demands of daily care. Depression Having diabetes means that you are at a higher risk for depression. Having depression also means that you are at a higher risk for diabetes. Your health care provider may test (  screen) you for symptoms of depression. It is important to recognize depression symptoms and to start treatment for depression soon after it is diagnosed. The following are some symptoms of depression:  Loss of interest in things that you used to enjoy.  Trouble sleeping, or often waking up early and not being able to get back to sleep.  A change in appetite.  Feeling tired most of the day.  Feeling nervous and anxious.  Feeling guilty and worrying that you are a burden to others.  Feeling depressed more often than you do not feel that way.  Thoughts of hurting yourself or feeling that you want to die. If you have any of these symptoms for 2 weeks or longer, reach out to a health care provider. Follow these instructions at home: Managing emotional distress The following are some ways to manage emotional distress:  Talk with your health care provider or certified diabetes educator. Consider  working with a counselor or therapist.  Learn as much as you can about diabetes and its treatment. Meet with a certified diabetes educator or take a class to learn how to manage your condition.  Keep a journal of your thoughts and concerns.  Accept that some things are out of your control.  Talk with other people who have diabetes. It can help to talk with others about the emotional distress that you feel.  Find ways to manage stress that work for you. These may include art or music therapy, exercise, meditation, and hobbies.  Seek support from spiritual leaders, family, and friends. General instructions  Follow your diabetes management plan.  Keep all follow-up visits as told by your health care provider. This is important. Where to find support   Ask your health care provider to recommend a therapist who understands both depression and diabetes.  Search for information and support from the American Diabetes Association: www.diabetes.org  Find a certified diabetes educator and make an appointment through [place] of Diabetes Educators: www.diabeteseducator.org Get help right away if:  You have thoughts about hurting yourself or others. If you ever feel like you may hurt yourself or others, or have thoughts about taking your own life, get help right away. You can go to your nearest emergency department or call:  Your local emergency services (911 in the [place]  A suicide crisis helpline, such as the [place] [facility] at 715-326-9069. This is open 24 hours a day. Summary  Diabetes (type 1 diabetes mellitus or type 2 diabetes mellitus) is a condition in which the body does not have enough of a hormone called insulin, or the body does not respond properly to insulin.  Living with diabetes puts you at risk for medical issues, and it also puts you at risk for emotional issues such as emotional distress, depression, and anxiety.  Recognizing the  symptoms of emotional distress and depression may help you avoid problems with your diabetes control. It is important to start treatment for emotional distress and depression soon after they are diagnosed.  Having diabetes means that you are at a higher risk for depression. Ask your health care provider to recommend a therapist who understands both depression and diabetes.  If you experience symptoms of emotional distress or depression, it is important to discuss this with your health care provider, certified diabetes educator, or therapist. This information is not intended to replace advice given to you by your health care provider. Make sure you discuss any questions you have with your health care provider.  Document Released: 01/06/2017 Document Revised: 09/04/2018 Document Reviewed: 01/06/2017 Elsevier Patient Education  [place].   Influenza Virus Vaccine injection (Fluarix) What is this medicine? INFLUENZA VIRUS VACCINE (in floo EN zuh VAHY ruhs vak SEEN) helps to reduce the risk of getting influenza also known as the flu. This medicine may be used for other purposes; ask your health care provider or pharmacist if you have questions. COMMON BRAND NAME(S): Fluarix, Fluzone What should I tell my health care provider before I take this medicine? They need to know if you have any of these conditions:  bleeding disorder like hemophilia  fever or infection  Guillain-Barre syndrome or other neurological problems  immune system problems  infection with the human immunodeficiency virus (HIV) or AIDS  low blood platelet counts  multiple sclerosis  an unusual or allergic reaction to influenza virus vaccine, eggs, chicken proteins, latex, gentamicin, other medicines, foods, dyes or preservatives  pregnant or trying to get pregnant  breast-feeding How should I use this medicine? This vaccine is for injection into a muscle. It is given by a health care professional. A copy of  Vaccine Information Statements will be given before each vaccination. Read this sheet carefully each time. The sheet may change frequently. Talk to your pediatrician regarding the use of this medicine in children. Special care may be needed. Overdosage: If you think you have taken too much of this medicine contact a poison control center or emergency room at once. NOTE: This medicine is only for you. Do not share this medicine with others. What if I miss a dose? This does not apply. What may interact with this medicine?  chemotherapy or radiation therapy  medicines that lower your immune system like etanercept, anakinra, infliximab, and adalimumab  medicines that treat or prevent blood clots like warfarin  phenytoin  steroid medicines like prednisone or cortisone  theophylline  vaccines This list may not describe all possible interactions. Give your health care provider a list of all the medicines, herbs, non-prescription drugs, or dietary supplements you use. Also tell them if you smoke, drink alcohol, or use illegal drugs. Some items may interact with your medicine. What should I watch for while using this medicine? Report any side effects that do not go away within 3 days to your doctor or health care professional. Call your health care provider if any unusual symptoms occur within 6 weeks of receiving this vaccine. You may still catch the flu, but the illness is not usually as bad. You cannot get the flu from the vaccine. The vaccine will not protect against colds or other illnesses that may cause fever. The vaccine is needed every year. What side effects may I notice from receiving this medicine? Side effects that you should report to your doctor or health care professional as soon as possible:  allergic reactions like skin rash, itching or hives, swelling of the face, lips, or tongue Side effects that usually do not require medical attention (report to your doctor or health care  professional if they continue or are bothersome):  fever  headache  muscle aches and pains  pain, tenderness, redness, or swelling at site where injected  weak or tired This list may not describe all possible side effects. Call your doctor for medical advice about side effects. You may report side effects to FDA at 1-800-FDA-1088. Where should I keep my medicine? This vaccine is only given in a clinic, pharmacy, doctor's office, or other health care setting and will not be stored  at home. NOTE: This sheet is a summary. It may not cover all possible information. If you have questions about this medicine, talk to your doctor, pharmacist, or health care provider.  2020 Elsevier/Gold Standard (2008-03-20 09:30:40)

## 2019-05-25 LAB — COMPREHENSIVE METABOLIC PANEL WITH GFR
ALT: 22 IU/L (ref 0–44)
AST: 15 IU/L (ref 0–40)
Albumin/Globulin Ratio: 1.6 (ref 1.2–2.2)
Albumin: 4.1 g/dL (ref 3.8–4.9)
Alkaline Phosphatase: 140 IU/L — ABNORMAL HIGH (ref 39–117)
BUN/Creatinine Ratio: 14 (ref 9–20)
BUN: 14 mg/dL (ref 6–24)
Bilirubin Total: 0.3 mg/dL (ref 0.0–1.2)
CO2: 24 mmol/L (ref 20–29)
Calcium: 9.4 mg/dL (ref 8.7–10.2)
Chloride: 107 mmol/L — ABNORMAL HIGH (ref 96–106)
Creatinine, Ser: 0.99 mg/dL (ref 0.76–1.27)
GFR calc Af Amer: 102 mL/min/1.73
GFR calc non Af Amer: 88 mL/min/1.73
Globulin, Total: 2.6 g/dL (ref 1.5–4.5)
Glucose: 106 mg/dL — ABNORMAL HIGH (ref 65–99)
Potassium: 4.1 mmol/L (ref 3.5–5.2)
Sodium: 142 mmol/L (ref 134–144)
Total Protein: 6.7 g/dL (ref 6.0–8.5)

## 2019-05-25 LAB — LIPID PANEL
Chol/HDL Ratio: 5.7 ratio — ABNORMAL HIGH (ref 0.0–5.0)
Cholesterol, Total: 183 mg/dL (ref 100–199)
HDL: 32 mg/dL — ABNORMAL LOW
LDL Chol Calc (NIH): 109 mg/dL — ABNORMAL HIGH (ref 0–99)
Triglycerides: 241 mg/dL — ABNORMAL HIGH (ref 0–149)
VLDL Cholesterol Cal: 42 mg/dL — ABNORMAL HIGH (ref 5–40)

## 2019-06-07 ENCOUNTER — Other Ambulatory Visit: Payer: Self-pay

## 2019-06-25 ENCOUNTER — Encounter: Payer: Self-pay | Admitting: Urology

## 2019-06-25 ENCOUNTER — Ambulatory Visit: Payer: Self-pay | Admitting: Urology

## 2019-07-23 ENCOUNTER — Encounter: Payer: Self-pay | Admitting: Urology

## 2019-07-23 ENCOUNTER — Ambulatory Visit (INDEPENDENT_AMBULATORY_CARE_PROVIDER_SITE_OTHER): Payer: Self-pay | Admitting: Urology

## 2019-07-23 ENCOUNTER — Other Ambulatory Visit: Payer: Self-pay

## 2019-07-23 ENCOUNTER — Other Ambulatory Visit: Payer: Self-pay | Admitting: Family Medicine

## 2019-07-23 VITALS — BP 120/80 | HR 70 | Ht 69.0 in | Wt 240.0 lb

## 2019-07-23 DIAGNOSIS — R351 Nocturia: Secondary | ICD-10-CM

## 2019-07-23 DIAGNOSIS — G479 Sleep disorder, unspecified: Secondary | ICD-10-CM

## 2019-07-23 DIAGNOSIS — R350 Frequency of micturition: Secondary | ICD-10-CM

## 2019-07-23 DIAGNOSIS — R0683 Snoring: Secondary | ICD-10-CM

## 2019-07-23 DIAGNOSIS — E669 Obesity, unspecified: Secondary | ICD-10-CM

## 2019-07-23 LAB — URINALYSIS, COMPLETE
Bilirubin, UA: NEGATIVE
Glucose, UA: NEGATIVE
Ketones, UA: NEGATIVE
Leukocytes,UA: NEGATIVE
Nitrite, UA: NEGATIVE
Protein,UA: NEGATIVE
Specific Gravity, UA: 1.025 (ref 1.005–1.030)
Urobilinogen, Ur: 1 mg/dL (ref 0.2–1.0)
pH, UA: 7 (ref 5.0–7.5)

## 2019-07-23 LAB — MICROSCOPIC EXAMINATION
Bacteria, UA: NONE SEEN
WBC, UA: NONE SEEN /hpf (ref 0–5)

## 2019-07-23 MED ORDER — TAMSULOSIN HCL 0.4 MG PO CAPS: 0.4000 mg | ORAL_CAPSULE | Freq: Every day | ORAL | 11 refills | Status: DC

## 2019-07-23 MED FILL — TAMSULOSIN HCL 0.4 MG CAP: 0.4 | 30 days supply | Qty: 30 | Fill #0

## 2019-07-23 NOTE — Patient Instructions (Signed)
Sleep Apnea Sleep apnea affects breathing during sleep. It causes breathing to stop for a short time or to become shallow. It can also increase the risk of:  Heart attack.  Stroke.  Being very overweight (obese).  Diabetes.  Heart failure.  Irregular heartbeat. The goal of treatment is to help you breathe normally again. What are the causes? There are three kinds of sleep apnea:  Obstructive sleep apnea. This is caused by a blocked or collapsed airway.  Central sleep apnea. This happens when the brain does not send the right signals to the muscles that control breathing.  Mixed sleep apnea. This is a combination of obstructive and central sleep apnea. The most common cause of this condition is a collapsed or blocked airway. This can happen if:  Your throat muscles are too relaxed.  Your tongue and tonsils are too large.  You are overweight.  Your airway is too small. What increases the risk?  Being overweight.  Smoking.  Having a small airway.  Being older.  Being male.  Drinking alcohol.  Taking medicines to calm yourself (sedatives or tranquilizers).  Having family members with the condition. What are the signs or symptoms?  Trouble staying asleep.  Being sleepy or tired during the day.  Getting angry a lot.  Loud snoring.  Headaches in the morning.  Not being able to focus your mind (concentrate).  Forgetting things.  Less interest in sex.  Mood swings.  Personality changes.  Feelings of sadness (depression).  Waking up a lot during the night to pee (urinate).  Dry mouth.  Sore throat. How is this diagnosed?  Your medical history.  A physical exam.  A test that is done when you are sleeping (sleep study). The test is most often done in a sleep lab but may also be done at home. How is this treated?   Sleeping on your side.  Using a medicine to get rid of mucus in your nose (decongestant).  Avoiding the use of alcohol,  medicines to help you relax, or certain pain medicines (narcotics).  Losing weight, if needed.  Changing your diet.  Not smoking.  Using a machine to open your airway while you sleep, such as: ? An oral appliance. This is a mouthpiece that shifts your lower jaw forward. ? A CPAP device. This device blows air through a mask when you breathe out (exhale). ? An EPAP device. This has valves that you put in each nostril. ? A BPAP device. This device blows air through a mask when you breathe in (inhale) and breathe out.  Having surgery if other treatments do not work. It is important to get treatment for sleep apnea. Without treatment, it can lead to:  High blood pressure.  Coronary artery disease.  In men, not being able to have an erection (impotence).  Reduced thinking ability. Follow these instructions at home: Lifestyle  Make changes that your doctor recommends.  Eat a healthy diet.  Lose weight if needed.  Avoid alcohol, medicines to help you relax, and some pain medicines.  Do not use any products that contain nicotine or tobacco, such as cigarettes, e-cigarettes, and chewing tobacco. If you need help quitting, ask your doctor. General instructions  Take over-the-counter and prescription medicines only as told by your doctor.  If you were given a machine to use while you sleep, use it only as told by your doctor.  If you are having surgery, make sure to tell your doctor you have sleep apnea. You  may need to bring your device with you.  Keep all follow-up visits as told by your doctor. This is important. Contact a doctor if:  The machine that you were given to use during sleep bothers you or does not seem to be working.  You do not get better.  You get worse. Get help right away if:  Your chest hurts.  You have trouble breathing in enough air.  You have an uncomfortable feeling in your back, arms, or stomach.  You have trouble talking.  One side of your  body feels weak.  A part of your face is hanging down. These symptoms may be an emergency. Do not wait to see if the symptoms will go away. Get medical help right away. Call your local emergency services (911 in the [place] Do not drive yourself to the hospital. Summary  This condition affects breathing during sleep.  The most common cause is a collapsed or blocked airway.  The goal of treatment is to help you breathe normally while you sleep. This information is not intended to replace advice given to you by your health care provider. Make sure you discuss any questions you have with your health care provider. Document Released: 06/01/2008 Document Revised: 06/09/2018 Document Reviewed: 04/18/2018 Elsevier Patient Education  [place].  Tamsulosin capsules What is this medicine? TAMSULOSIN (tam SOO loe sin) is an alpha blocker. It is used to treat the signs and symptoms of an enlarged prostate in men. This condition is also called benign prostatic hyperplasia (BPH). This medicine may be used for other purposes; ask your health care provider or pharmacist if you have questions. COMMON BRAND NAME(S): Flomax What should I tell my health care provider before I take this medicine? They need to know if you have any of the following conditions:  advanced kidney disease  advanced liver disease  low blood pressure  prostate cancer  an unusual or allergic reaction to tamsulosin, sulfa drugs, other medicines, foods, dyes, or preservatives  pregnant or trying to get pregnant  breast-feeding How should I use this medicine? Take this medicine by mouth about 30 minutes after the same meal every day. Follow the directions on the prescription label. Swallow the capsules whole with a glass of water. Do not crush, chew, or open capsules. Do not take your medicine more often than directed. Do not stop taking your medicine unless your doctor tells you to. Talk to your pediatrician regarding  the use of this medicine in children. Special care may be needed. Overdosage: If you think you have taken too much of this medicine contact a poison control center or emergency room at once. NOTE: This medicine is only for you. Do not share this medicine with others. What if I miss a dose? If you miss a dose, take it as soon as you can. If it is almost time for your next dose, take only that dose. Do not take double or extra doses. If you stop taking your medicine for several days or more, ask your doctor or health care professional what dose you should start back on. What may interact with this medicine?  cimetidine  fluoxetine  ketoconazole  medicines for erectile disfunction like sildenafil, tadalafil, vardenafil  medicines for high blood pressure  other alpha-blockers like alfuzosin, doxazosin, phentolamine, phenoxybenzamine, prazosin, terazosin  warfarin This list may not describe all possible interactions. Give your health care provider a list of all the medicines, herbs, non-prescription drugs, or dietary supplements you use. Also tell  them if you smoke, drink alcohol, or use illegal drugs. Some items may interact with your medicine. What should I watch for while using this medicine? Visit your doctor or health care professional for regular check ups. You will need lab work done before you start this medicine and regularly while you are taking it. Check your blood pressure as directed. Ask your health care professional what your blood pressure should be, and when you should contact him or her. This medicine may make you feel dizzy or lightheaded. This is more likely to happen after the first dose, after an increase in dose, or during hot weather or exercise. Drinking alcohol and taking some medicines can make this worse. Do not drive, use machinery, or do anything that needs mental alertness until you know how this medicine affects you. Do not sit or stand up quickly. If you begin to  feel dizzy, sit down until you feel better. These effects can decrease once your body adjusts to the medicine. Contact your doctor or health care professional right away if you have an erection that lasts longer than 4 hours or if it becomes painful. This may be a sign of a serious problem and must be treated right away to prevent permanent damage. If you are thinking of having cataract surgery, tell your eye surgeon that you have taken this medicine. What side effects may I notice from receiving this medicine? Side effects that you should report to your doctor or health care professional as soon as possible:  allergic reactions like skin rash or itching, hives, swelling of the lips, mouth, tongue, or throat  breathing problems  change in vision  feeling faint or lightheaded  irregular heartbeat  prolonged or painful erection  weakness Side effects that usually do not require medical attention (report to your doctor or health care professional if they continue or are bothersome):  back pain  change in sex drive or performance  constipation, nausea or vomiting  cough  drowsy  runny or stuffy nose  trouble sleeping This list may not describe all possible side effects. Call your doctor for medical advice about side effects. You may report side effects to FDA at 1-800-FDA-1088. Where should I keep my medicine? Keep out of the reach of children. Store at room temperature between 15 and 30 degrees C (59 and 86 degrees F). Throw away any unused medicine after the expiration date. NOTE: This sheet is a summary. It may not cover all possible information. If you have questions about this medicine, talk to your doctor, pharmacist, or health care provider.  2020 Elsevier/Gold Standard (2018-01-26 12:54:06)

## 2019-07-23 NOTE — Progress Notes (Signed)
Patient ID: Dustin Hale, male   DOB: 05/09/1966, 53 y.o.   MRN: [id number]   53 year old male status post recent visit with urology in follow-up of hematuria seen on urinalysis as well as patient with urinary frequency/nocturia.  Per urology note, urologist is suspicious that patient may have sleep apnea as a contributing factor to his nocturia/frequency.  Order will be placed for patient to have sleep study and he will be contacted to arrange test.

## 2019-07-23 NOTE — Progress Notes (Signed)
07/23/19 2:29 PM   Dustin Hale 09-Feb-1966 [id number]  Referring provider: Antony Blackbird, MD [place],  [place] [id number]  CC: "Microscopic hematuria" and urinary frequency  HPI: I saw Dustin Hale in urology clinic in consultation for "microscopic hematuria" and urinary frequency from Dr. Chapman Fitch.  He is a 53 year old African-American male with well-controlled diabetes and obesity who was found to have dipstick positive blood on multiple urine samples in January and February 2020.  However, microscopic analysis showed 0-2 RBCs.  It looks like a CT urogram was ordered by Dr. Matilde Sprang, but I am unable to access any notes from [facility].  The CT completed 11/15/2018 was completely benign.  He denies any history of gross hematuria.  His primary urinary complaint is urinary frequency during the day and overnight.  He reports he urinates 7-8 times overnight.  He has some component of weak stream as well.  He has never tried any medications for this.  He has well-controlled diabetes, but does drink sodas and Kool-Aid during the day.  He is a heavy snorer and sleeps poorly, but has never been evaluated for sleep apnea.  Urinalysis today is benign with 0 WBCs, 0-2 RBCs, no bacteria, no yeast, nitrite negative.  He has an 11-pack-year smoking history and denies any other carcinogenic exposures.  He quit smoking over a year ago.  PSA was normal at 0.9 in February 2020.   PMH: Past Medical History:  Diagnosis Date  . Diabetes mellitus without complication ([place])   . Hyperlipidemia   . Obesity   . Poor dental hygiene     Surgical History: Past Surgical History:  Procedure Laterality Date  . NO PAST SURGERIES      Allergies: No Known Allergies  Family History: Family History  Problem Relation Age of Onset  . Hypertension Mother   . Other Mother        fire  . Hypertension Father   . Other Father        car crash  . Hypertension Maternal Grandmother   . Heart attack  Maternal Grandmother   . Arthritis Maternal Grandmother   . Hypertension Maternal Grandfather   . Hypertension Paternal Grandmother   . Stroke Other   . Colon cancer Neg Hx   . Rectal cancer Neg Hx   . Stomach cancer Neg Hx     Social History:  reports that he has quit smoking. His smoking use included cigarettes. He has a 10.56 pack-year smoking history. He has never used smokeless tobacco. He reports current alcohol use. He reports previous drug use. Drug: Marijuana.  ROS: Please see flowsheet from today's date for complete review of systems.  Physical Exam: BP 120/80 (BP Location: Left Arm, Patient Position: Sitting, Cuff Size: Large)   Pulse 70   Ht 5\' 9"  (1.753 m)   Wt 240 lb (108.9 kg)   BMI 35.44 kg/m    Constitutional:  Alert and oriented, No acute distress. Cardiovascular: No clubbing, cyanosis, or edema. Respiratory: Normal respiratory effort, no increased work of breathing. GI: Abdomen is soft, nontender, nondistended, no abdominal masses Lymph: No cervical or inguinal lymphadenopathy. Skin: No rashes, bruises or suspicious lesions. Neurologic: Grossly intact, no focal deficits, moving all 4 extremities. Psychiatric: Normal mood and affect.  Laboratory Data: See HPI  Pertinent Imaging: I have personally reviewed the CT urogram dated 11/15/2018, benign  Assessment & Plan:   In summary, the patient is a 53 year old male with dipstick positive urine, but no documented microscopic  hematuria in the past, or today and urinalysis.  He did undergo a CT urogram ordered by different provider which was benign in March 2020.  His primary issue today is urinary frequency during the day, especially in the evening.  I have a high suspicion for sleep apnea with his body habitus and heavy snoring as one of the main etiologies of his poor sleep overnight and nocturia.  We discussed behavioral strategies at length regarding his frequency including optimal control of diabetes, minimizing  juices and sodas in the diet, and being evaluated for sleep apnea.  -Strongly recommend evaluation for sleep apnea -Trial of Flomax for urinary symptoms -RTC 4 weeks virtual visit for symptom check  A total of 40 minutes were spent face-to-face with the patient, greater than 50% was spent in patient education, counseling, and coordination of care regarding urinary symptoms, AUA guidelines regarding microscopic hematuria, and sleep apnea.   Billey Co, [facility] [address] [employer], [place] [address] [phone]

## 2019-08-14 ENCOUNTER — Telehealth: Payer: Self-pay

## 2019-08-14 NOTE — Telephone Encounter (Signed)
This CM spoke to [place] at [facility] sleep center regarding patient's sleep study. She said that he has not yet been scheduled and they will attempt to contact him today.

## 2019-08-22 ENCOUNTER — Ambulatory Visit: Payer: Self-pay | Attending: Family Medicine | Admitting: Family Medicine

## 2019-08-22 ENCOUNTER — Other Ambulatory Visit: Payer: Self-pay

## 2019-08-22 ENCOUNTER — Encounter: Payer: Self-pay | Admitting: Family Medicine

## 2019-08-22 DIAGNOSIS — E119 Type 2 diabetes mellitus without complications: Secondary | ICD-10-CM

## 2019-08-22 DIAGNOSIS — R21 Rash and other nonspecific skin eruption: Secondary | ICD-10-CM

## 2019-08-22 MED ORDER — TERBINAFINE HCL 1 % EX CREA: 1.0000 "application " | TOPICAL_CREAM | Freq: Two times a day (BID) | CUTANEOUS | 0 refills | Status: DC

## 2019-08-22 NOTE — Progress Notes (Signed)
Virtual Visit via Telephone Note  I connected with Dustin Hale on 08/22/19 at 10:50 AM EST by telephone and verified that I am speaking with the correct person using two identifiers.   I discussed the limitations, risks, security and privacy concerns of performing an evaluation and management service by telephone and the availability of in person appointments. I also discussed with the patient that there may be a patient responsible charge related to this service. The patient expressed understanding and agreed to proceed.  Patient Location: Home Provider Location: Office Others participating in call: none   History of Present Illness:       53 year old male with complaint of an itchy rash on the second toe of the right foot which seems to be spreading.  Patient states that he is used over-the-counter Neosporin on the area without relief.  Rash has been going on for few weeks.  Patient would like a referral to podiatry.  He reports similar rash a few years ago for which he was treated and then the rash went away.  Diabetes has remained well controlled, no increased thirst or urinary frequency related to diabetes.  No foot pain.  No open areas or blisters on the feet.   Past Medical History:  Diagnosis Date  . Diabetes mellitus without complication ([place])   . Hyperlipidemia   . Obesity   . Poor dental hygiene     Past Surgical History:  Procedure Laterality Date  . NO PAST SURGERIES      Family History  Problem Relation Age of Onset  . Hypertension Mother   . Other Mother        fire  . Hypertension Father   . Other Father        car crash  . Hypertension Maternal Grandmother   . Heart attack Maternal Grandmother   . Arthritis Maternal Grandmother   . Hypertension Maternal Grandfather   . Hypertension Paternal Grandmother   . Stroke Other   . Colon cancer Neg Hx   . Rectal cancer Neg Hx   . Stomach cancer Neg Hx     Social History   Tobacco Use  . Smoking status:  Former Smoker    Packs/day: 0.33    Years: 32.00    Pack years: 10.56    Types: Cigarettes  . Smokeless tobacco: Never Used  . Tobacco comment: quit smoking on 08/27/14  Substance Use Topics  . Alcohol use: Yes    Alcohol/week: 0.0 standard drinks    Comment: occasional   . Drug use: Not Currently    Types: Marijuana    Comment: Last use August 2019     No Known Allergies     Observations/Objective: No vital signs or physical exam conducted as visit was done via telephone  Assessment and Plan: 1. Rash and nonspecific skin eruption; 2.  Type 2 diabetes without complication, without long-term current use of insulin Discussed with patient that I suspect that he may have fungal infection of the feet, " athlete's foot" as the cause of his rash and itching.  Prescription sent to pharmacy for Lamisil or he may obtain over-the-counter Lamisil for treatment.  Patient will also be referred to podiatry.  Keep upcoming appointment regarding diabetes and return sooner if any concerns. - Ambulatory referral to Podiatry - terbinafine (LAMISIL AT) 1 % cream; Apply 1 application topically 2 (two) times daily. X 10 days and then as needed  Dispense: 30 g; Refill: 0    Follow Up Instructions:Return  for Keep scheduled follow-up or 1 to 2 months; sooner if needed.    I discussed the assessment and treatment plan with the patient. The patient was provided an opportunity to ask questions and all were answered. The patient agreed with the plan and demonstrated an understanding of the instructions.   The patient was advised to call back or seek an in-person evaluation if the symptoms worsen or if the condition fails to improve as anticipated.  I provided 7 minutes of non-face-to-face time during this encounter.   Antony Blackbird, MD

## 2019-08-22 NOTE — Progress Notes (Signed)
Patient verified DOB Patient has taken medication today. Patient has eaten today.

## 2019-08-27 ENCOUNTER — Other Ambulatory Visit: Payer: Self-pay

## 2019-08-27 ENCOUNTER — Ambulatory Visit (INDEPENDENT_AMBULATORY_CARE_PROVIDER_SITE_OTHER): Payer: No Typology Code available for payment source | Admitting: Podiatry

## 2019-08-27 DIAGNOSIS — Z5329 Procedure and treatment not carried out because of patient's decision for other reasons: Secondary | ICD-10-CM

## 2019-09-02 NOTE — Progress Notes (Signed)
Patient left without being seen for unknown reason.

## 2019-09-04 ENCOUNTER — Other Ambulatory Visit: Payer: Self-pay | Admitting: Nurse Practitioner

## 2019-09-04 ENCOUNTER — Telehealth: Payer: Self-pay | Admitting: Family Medicine

## 2019-09-04 ENCOUNTER — Telehealth (INDEPENDENT_AMBULATORY_CARE_PROVIDER_SITE_OTHER): Payer: Self-pay | Admitting: Urology

## 2019-09-04 ENCOUNTER — Other Ambulatory Visit: Payer: Self-pay

## 2019-09-04 DIAGNOSIS — K089 Disorder of teeth and supporting structures, unspecified: Secondary | ICD-10-CM

## 2019-09-04 DIAGNOSIS — R351 Nocturia: Secondary | ICD-10-CM

## 2019-09-04 NOTE — Telephone Encounter (Signed)
Patient called asking for a referral to dental

## 2019-09-04 NOTE — Progress Notes (Signed)
Virtual Visit via Telephone Note  I connected with Dustin Hale on 09/04/19 at 11:00 AM EST by telephone and verified that I am speaking with the correct person using two identifiers.   I discussed the limitations, risks, security and privacy concerns of performing an evaluation and management service by telephone and the availability of in person appointments. We discussed the impact of the COVID-19 on the healthcare system, and the importance of social distancing and reducing patient and provider exposure. I also discussed with the patient that there may be a patient responsible charge related to this service. The patient expressed understanding and agreed to proceed.  Reason for visit: Nocturia  History of Present Illness: I had phone follow-up with Dustin Hale today regarding his urinary symptoms.  His primary complaint is urinary frequency during the day and nocturia 7-8 times overnight.  He is a heavy snorer, and at our last visit I strongly recommended evaluation for sleep apnea, and strongly suspect that is the etiology of his bothersome nocturia.  He has not yet been evaluated for sleep apnea.  He does not note any significant improvement in his urinary symptoms on Flomax.  I again reiterated to the patient the correlation between sleep apnea and nocturia.  We also discussed behavioral strategies including minimizing sodas and Kool-Aid and juices during the day regarding his urinary frequency and urgency.  He is amenable to being evaluated for sleep apnea.  Follow Up: -RTC 6 months with PA for symptom check -Could consider anticholinergic for OAB in the future if ongoing symptoms, though I strongly suspect sleep apnea and soda intake as the source of his urinary complaints   I discussed the assessment and treatment plan with the patient. The patient was provided an opportunity to ask questions and all were answered. The patient agreed with the plan and demonstrated an understanding of the  instructions.   The patient was advised to call back or seek an in-person evaluation if the symptoms worsen or if the condition fails to improve as anticipated.  I provided 12 minutes of non-face-to-face time during this encounter.   Billey Co, MD

## 2019-09-05 ENCOUNTER — Telehealth: Payer: Self-pay

## 2019-09-05 NOTE — Telephone Encounter (Signed)
Call placed to [facility] to check on status of sleep study order. Message left with call back requested to this CM

## 2019-09-10 ENCOUNTER — Telehealth (HOSPITAL_BASED_OUTPATIENT_CLINIC_OR_DEPARTMENT_OTHER): Payer: Self-pay | Admitting: *Deleted

## 2019-09-10 NOTE — Telephone Encounter (Signed)
Pt called. Declined to schedule.

## 2019-09-12 ENCOUNTER — Telehealth: Payer: Self-pay

## 2019-09-12 NOTE — Telephone Encounter (Signed)
This CM spoke to Dustin Hale. She explained that she contacted the patient and declined to schedule the sleep study stating that he just wants to see Dr Chapman Fitch.   He has an appt with Dr Chapman Fitch 09/14/2019.  Attempted to contact the patient to remind him of appt this week. Message left with call back requested to this CM

## 2019-09-14 ENCOUNTER — Ambulatory Visit: Payer: Self-pay | Attending: Family Medicine | Admitting: Family Medicine

## 2019-09-14 ENCOUNTER — Other Ambulatory Visit: Payer: Self-pay

## 2019-09-14 ENCOUNTER — Encounter: Payer: Self-pay | Admitting: Family Medicine

## 2019-09-14 DIAGNOSIS — K089 Disorder of teeth and supporting structures, unspecified: Secondary | ICD-10-CM

## 2019-09-14 NOTE — Progress Notes (Signed)
Patient has been called and DOB has been verified. Patient has been screened and transferred to PCP to start phone visit.   Needs dental referral.

## 2019-09-14 NOTE — Progress Notes (Signed)
Virtual Visit via Telephone Note  I connected with Dustin Hale on 09/14/19 at  1:50 PM EST by telephone and verified that I am speaking with the correct person using two identifiers.   I discussed the limitations, risks, security and privacy concerns of performing an evaluation and management service by telephone and the availability of in person appointments. I also discussed with the patient that there may be a patient responsible charge related to this service. The patient expressed understanding and agreed to proceed.  Patient Location: [employer] executive Location: [facility] Others participating in call: none   History of Present Illness:        54 yo male who states that he went to his appointment with the foot doctor and he states that he at there for an hour and then he went back to the counter and he was told that they would see why he had not been called back and he was told that his doctor was not actually there in the building. He then had the medication that prescribed through this office for his foot condition and states that the problem cleared up.          He reports that he is calling today because he needs a referral to a dentist.  He states that there is a dental office on [facility] where he has been seen in the past however when he went there recently he was told that he would need a referral from his primary care office in order to be seen.  He is not exactly sure of the name of the practice but has this information at home.  He reports that he has had longstanding dental issues but has had no recent follow-up.  He reports that he currently has some teeth that are loose and has had some teeth that have already fallen out.  He cannot eat certain foods because he cannot chew food well due to the issues with his teeth.  He denies current dental pain.  No current difficulty swallowing.  No facial swelling.         He was also scheduled for recent sleep study but he  declined as he states that he did not have any information about what a sleep study would entail.   Past Medical History:  Diagnosis Date  . Diabetes mellitus without complication ([place])   . Hyperlipidemia   . Obesity   . Poor dental hygiene     Past Surgical History:  Procedure Laterality Date  . NO PAST SURGERIES      Family History  Problem Relation Age of Onset  . Hypertension Mother   . Other Mother        fire  . Hypertension Father   . Other Father        car crash  . Hypertension Maternal Grandmother   . Heart attack Maternal Grandmother   . Arthritis Maternal Grandmother   . Hypertension Maternal Grandfather   . Hypertension Paternal Grandmother   . Stroke Other   . Colon cancer Neg Hx   . Rectal cancer Neg Hx   . Stomach cancer Neg Hx     Social History   Tobacco Use  . Smoking status: Former Smoker    Packs/day: 0.33    Years: 32.00    Pack years: 10.56    Types: Cigarettes  . Smokeless tobacco: Never Used  . Tobacco comment: quit smoking on 08/27/14  Substance Use Topics  . Alcohol  use: Yes    Alcohol/week: 0.0 standard drinks    Comment: occasional   . Drug use: Not Currently    Types: Marijuana    Comment: Last use August 2019     No Known Allergies     Observations/Objective: No vital signs or physical exam conducted as visit was done via telephone  Assessment and Plan: 1. Poor dentition On review of chart, patient had dental referral placed in December 2020 but he reports that he did not hear anything in follow-up of that referral and he went by the office of his prior dentist but will need a referral.  He believes that he was previously seen at [facility] however he is not sure and currently not at home so he will be contacted again this afternoon in order to make sure that he is referred to the proper dental office.  - Ambulatory referral to Dentistry  *Discussed with patient that his urologist had suggested that patient have a sleep  study as the urologist felt that patient was having issues with sleep apnea which were contributing to patient's nocturia.  Discussed with the patient that the tests are occurring the sleep center and described procedure to patient.  He states that he is now interested in having sleep study.  When patient is contacted later to find out the name of his dentist, he will be provided with a number at which he can contact sleep medicine if he wishes to reschedule his sleep study.  Follow Up Instructions:Return for chronic issues; keep scheduled follow-up and as needed.    I discussed the assessment and treatment plan with the patient. The patient was provided an opportunity to ask questions and all were answered. The patient agreed with the plan and demonstrated an understanding of the instructions.   The patient was advised to call back or seek an in-person evaluation if the symptoms worsen or if the condition fails to improve as anticipated.  I provided 8 minutes of non-face-to-face time during this encounter.   Antony Blackbird, MD

## 2019-10-24 ENCOUNTER — Ambulatory Visit: Payer: Self-pay | Admitting: Family Medicine

## 2019-10-28 ENCOUNTER — Encounter (HOSPITAL_COMMUNITY): Payer: Self-pay | Admitting: Emergency Medicine

## 2019-10-28 ENCOUNTER — Emergency Department (HOSPITAL_COMMUNITY)
Admission: EM | Admit: 2019-10-28 | Discharge: 2019-10-28 | Disposition: A | Discharge status: Home / Self Care | Payer: Self-pay | Attending: Emergency Medicine | Admitting: Emergency Medicine

## 2019-10-28 ENCOUNTER — Emergency Department (HOSPITAL_COMMUNITY): Payer: Self-pay

## 2019-10-28 ENCOUNTER — Other Ambulatory Visit: Payer: Self-pay

## 2019-10-28 DIAGNOSIS — R109 Unspecified abdominal pain: Secondary | ICD-10-CM | POA: Insufficient documentation

## 2019-10-28 DIAGNOSIS — E119 Type 2 diabetes mellitus without complications: Secondary | ICD-10-CM | POA: Insufficient documentation

## 2019-10-28 DIAGNOSIS — Z79899 Other long term (current) drug therapy: Secondary | ICD-10-CM | POA: Insufficient documentation

## 2019-10-28 LAB — CBC WITH DIFFERENTIAL/PLATELET
Abs Immature Granulocytes: 0.01 10*3/uL (ref 0.00–0.07)
Basophils Absolute: 0 10*3/uL (ref 0.0–0.1)
Basophils Relative: 0 %
Eosinophils Absolute: 0.2 10*3/uL (ref 0.0–0.5)
Eosinophils Relative: 4 %
HCT: 43.4 % (ref 39.0–52.0)
Hemoglobin: 14.1 g/dL (ref 13.0–17.0)
Immature Granulocytes: 0 %
Lymphocytes Relative: 36 %
Lymphs Abs: 1.9 10*3/uL (ref 0.7–4.0)
MCH: 30.5 pg (ref 26.0–34.0)
MCHC: 32.5 g/dL (ref 30.0–36.0)
MCV: 93.7 fL (ref 80.0–100.0)
Monocytes Absolute: 0.4 10*3/uL (ref 0.1–1.0)
Monocytes Relative: 8 %
Neutro Abs: 2.8 10*3/uL (ref 1.7–7.7)
Neutrophils Relative %: 52 %
Platelets: 168 10*3/uL (ref 150–400)
RBC: 4.63 MIL/uL (ref 4.22–5.81)
RDW: 12.7 % (ref 11.5–15.5)
WBC: 5.4 10*3/uL (ref 4.0–10.5)
nRBC: 0 % (ref 0.0–0.2)

## 2019-10-28 LAB — URINALYSIS, ROUTINE W REFLEX MICROSCOPIC
Bilirubin Urine: NEGATIVE
Glucose, UA: NEGATIVE mg/dL
Hgb urine dipstick: NEGATIVE
Ketones, ur: NEGATIVE mg/dL
Leukocytes,Ua: NEGATIVE
Nitrite: NEGATIVE
Protein, ur: NEGATIVE mg/dL
Specific Gravity, Urine: 1.025 (ref 1.005–1.030)
pH: 6 (ref 5.0–8.0)

## 2019-10-28 LAB — COMPREHENSIVE METABOLIC PANEL
ALT: 23 U/L (ref 0–44)
AST: 19 U/L (ref 15–41)
Albumin: 3.8 g/dL (ref 3.5–5.0)
Alkaline Phosphatase: 111 U/L (ref 38–126)
Anion gap: 9 (ref 5–15)
BUN: 12 mg/dL (ref 6–20)
CO2: 23 mmol/L (ref 22–32)
Calcium: 8.9 mg/dL (ref 8.9–10.3)
Chloride: 107 mmol/L (ref 98–111)
Creatinine, Ser: 1 mg/dL (ref 0.61–1.24)
GFR calc Af Amer: >60 mL/min (ref >60)
GFR calc non Af Amer: >60 mL/min (ref >60)
Glucose, Bld: 103 mg/dL — ABNORMAL HIGH (ref 70–99)
Potassium: 3.8 mmol/L (ref 3.5–5.1)
Sodium: 139 mmol/L (ref 135–145)
Total Bilirubin: 0.7 mg/dL (ref 0.3–1.2)
Total Protein: 6.7 g/dL (ref 6.5–8.1)

## 2019-10-28 MED ORDER — SODIUM CHLORIDE 0.9 % IV BOLUS
1000.0000 mL | Freq: Once | INTRAVENOUS | Status: AC
  Administered 2019-10-28: 12:00:00 1000 mL via INTRAVENOUS

## 2019-10-28 MED ORDER — MORPHINE SULFATE (PF) 4 MG/ML IV SOLN
4.0000 mg | Freq: Once | INTRAVENOUS | Status: AC
  Administered 2019-10-28: 12:00:00 4 mg via INTRAVENOUS
  Filled 2019-10-28: qty 1

## 2019-10-28 MED ORDER — CYCLOBENZAPRINE HCL 10 MG PO TABS: 10.0000 mg | ORAL_TABLET | Freq: Two times a day (BID) | ORAL | 0 refills | Status: DC | PRN

## 2019-10-28 MED ORDER — ONDANSETRON HCL 4 MG/2ML IJ SOLN
4.0000 mg | Freq: Once | INTRAMUSCULAR | Status: AC
  Administered 2019-10-28: 12:00:00 4 mg via INTRAVENOUS
  Filled 2019-10-28: qty 2

## 2019-10-28 MED ORDER — IOHEXOL 300 MG/ML  SOLN
100.0000 mL | Freq: Once | INTRAMUSCULAR | Status: AC | PRN
  Administered 2019-10-28: 100 mL via INTRAVENOUS

## 2019-10-28 MED ORDER — LIDOCAINE 5 % EX PTCH: 1.0000 | MEDICATED_PATCH | CUTANEOUS | 0 refills | Status: DC

## 2019-10-28 NOTE — ED Notes (Signed)
ED Provider at bedside. 

## 2019-10-28 NOTE — ED Triage Notes (Signed)
Patient c/o right lower side pain, believes it is his kidneys. Describes pain as constant and increasing over past 2 weeks. Denies any urinary symptoms.

## 2019-10-28 NOTE — ED Notes (Addendum)
Patient verbalizes understanding of discharge instructions. Opportunity for questioning and answers were provided. Armband removed by staff, pt discharged from ED ambulatory.   

## 2019-10-28 NOTE — ED Notes (Signed)
Patient transported to CT 

## 2019-10-28 NOTE — ED Provider Notes (Signed)
[facility] EMERGENCY DEPARTMENT Provider Note   CSN: [id number] Arrival date & time: 10/28/19  1112    History Right side pain   Dustin Hale is a 54 y.o. male with past medical history significant for DM, hyperlipidemia, obesity who presents for evaluation of right flank pain x 1-2 weeks.  Initially started as a "small ache" and now is a constant throbbing.  Pain somewhat worsened when he bends over.  He denies any midline back pain, IV drug use, bowel or bladder incontinence, saddle paresthesias.  He is followed by urology for hematuria and polyuria.  Patient states he is diabetic however he is noncompliant with his Metformin because it gives him diarrhea.  Denies fever, chills, nausea, vomiting, chest pain, shortness of breath, midline abdominal pain radiating to his back, dysuria, diarrhea or constipation.  Patient states he has polyuria however has had this for "years."  Rates his pain a 9/10.  Denies additional aggravating or alleviating factors. Pain not worse with food intake. Pain is not pleuritic. No prior Hx of PE, DVT. No hemoptysis. No recent surgery, immobilization, malignancy.  History obtained from patient and past medical history . No interpretor was used.  Patient states that he has been seen by 2 different Urology ([facility], [facility]) and states "no one can figure what wrong with me."  [facility] Dr. Diamantina Providence- Seen for polyuria, OAB, hematuria, CT Urogram with Dr. Matilde Sprang PCP- [place] [facility]  HPI     Past Medical History:  Diagnosis Date  . Diabetes mellitus without complication ([place])   . Hyperlipidemia   . Obesity   . Poor dental hygiene     Patient Active Problem List   Diagnosis Date Noted  . BPH (benign prostatic hyperplasia) 10/25/2018  . Hyperlipidemia 10/16/2018  . Right lateral epicondylitis 10/01/2016  . Mental health problem 10/01/2016  . Degenerative joint disease, shoulder, left 10/27/2015  . Farsightedness  06/20/2015  . Diabetes type 2, controlled ([place]) 06/20/2015  . Radicular pain in left arm 06/20/2015  . Current smoker 06/20/2015    Past Surgical History:  Procedure Laterality Date  . NO PAST SURGERIES         Family History  Problem Relation Age of Onset  . Hypertension Mother   . Other Mother        fire  . Hypertension Father   . Other Father        car crash  . Hypertension Maternal Grandmother   . Heart attack Maternal Grandmother   . Arthritis Maternal Grandmother   . Hypertension Maternal Grandfather   . Hypertension Paternal Grandmother   . Stroke Other   . Colon cancer Neg Hx   . Rectal cancer Neg Hx   . Stomach cancer Neg Hx     Social History   Tobacco Use  . Smoking status: Former Smoker    Packs/day: 0.33    Years: 32.00    Pack years: 10.56    Types: Cigarettes  . Smokeless tobacco: Never Used  . Tobacco comment: quit smoking on 08/27/14  Substance Use Topics  . Alcohol use: Yes    Alcohol/week: 0.0 standard drinks    Comment: occasional   . Drug use: Not Currently    Types: Marijuana    Comment: Last use August 2019    Home Medications Prior to Admission medications   Medication Sig Start Date End Date Taking? Authorizing Provider  bisacodyl (BISACODYL) 5 MG EC tablet Take 5 mg by mouth once. Dulcolax 5  mg tab take as directed for colonoscopy prep.    [provider]  blood glucose meter kit and supplies KIT Dispense based on patient and insurance preference. Use up to four times daily as directed. (FOR ICD-9 250.00, 250.01). 09/22/14   Emokpae, Ejiroghene E, MD  Blood Glucose Monitoring Suppl (TRUE METRIX METER) W/DEVICE KIT 1 each by Does not apply route as needed. 06/20/15   Funches, Adriana Mccallum, MD  ciprofloxacin (CIPRO) 500 MG tablet Take 1 tablet (500 mg total) by mouth 2 (two) times daily. Patient not taking: Reported on 09/14/2019 05/24/19   Fulp, Ander Gaster, MD  cyclobenzaprine (FLEXERIL) 10 MG tablet Take 1 tablet (10 mg total) by  mouth 2 (two) times daily as needed for muscle spasms. 10/28/19   General Wearing A, PA-C  glucose blood (TRUE METRIX BLOOD GLUCOSE TEST) test strip 1 each by Other route 3 (three) times daily. 06/20/15   Funches, Adriana Mccallum, MD  lidocaine (LIDODERM) 5 % Place 1 patch onto the skin daily. Remove & Discard patch within 12 hours or as directed by MD 10/28/19   Masao Junker A, PA-C  metFORMIN (GLUCOPHAGE) 500 MG tablet Take 2 pills once per day after the evening meal 11/09/18   Fulp, Cammie, MD  simvastatin (ZOCOR) 20 MG tablet One pill by mouth each evening to help lower cholesterol 06/14/18   Fulp, Cammie, MD  tamsulosin (FLOMAX) 0.4 MG CAPS capsule Take 1 capsule (0.4 mg total) by mouth daily. 07/23/19   Billey Co, MD  terbinafine (LAMISIL AT) 1 % cream Apply 1 application topically 2 (two) times daily. X 10 days and then as needed 08/22/19   Fulp, Cammie, MD  TRUEPLUS LANCETS 28G MISC 1 each by Does not apply route 3 (three) times daily. 06/20/15   Boykin Nearing, MD    Allergies    Patient has no known allergies.  Review of Systems   Review of Systems  Constitutional: Negative.   HENT: Negative.   Respiratory: Negative.   Cardiovascular: Negative.   Gastrointestinal: Negative.   Endocrine: Positive for polyuria.  Genitourinary: Positive for flank pain and hematuria (Chronic hematuria). Negative for decreased urine volume, discharge, penile pain, penile swelling, scrotal swelling, testicular pain and urgency.  Musculoskeletal: Negative for back pain, gait problem, joint swelling, myalgias, neck pain and neck stiffness.  Skin: Negative.   Neurological: Negative.   All other systems reviewed and are negative.   Physical Exam Updated Vital Signs BP 103/81   Pulse (!) 39   Temp 98.3 F (36.8 C) (Oral)   Resp 14   SpO2 95%   Physical Exam Vitals and nursing note reviewed.  Constitutional:      General: He is not in acute distress.    Appearance: He is well-developed. He is  not ill-appearing, toxic-appearing or diaphoretic.  HENT:     Head: Normocephalic and atraumatic.     Nose: Nose normal.     Mouth/Throat:     Mouth: Mucous membranes are moist.     Pharynx: Oropharynx is clear.  Eyes:     Pupils: Pupils are equal, round, and reactive to light.  Cardiovascular:     Rate and Rhythm: Normal rate and regular rhythm.     Pulses: Normal pulses.     Heart sounds: Normal heart sounds.  Pulmonary:     Effort: Pulmonary effort is normal. No respiratory distress.     Breath sounds: Normal breath sounds.  Abdominal:     General: Bowel sounds are normal. There is no  distension.     Palpations: Abdomen is soft.     Tenderness: There is abdominal tenderness. There is no right CVA tenderness, left CVA tenderness, guarding or rebound. Negative signs include Murphy's sign and McBurney's sign.     Hernia: No hernia is present.       Comments: Soft without rebound or guarding.  There is some generalized tenderness to his right flank and into his right mid abdomen.  Negative Murphy sign, McBurney point. Neg CVA tap bilaterally  Musculoskeletal:        General: Normal range of motion.     Cervical back: Normal, normal range of motion and neck supple.     Thoracic back: Normal.     Lumbar back: Normal.       Back:  Skin:    General: Skin is warm and dry.  Neurological:     Mental Status: He is alert.     ED Results / Procedures / Treatments   Labs (all labs ordered are listed, but only abnormal results are displayed) Labs Reviewed  COMPREHENSIVE METABOLIC PANEL - Abnormal; Notable for the following components:      Result Value   Glucose, Bld 103 (*)    All other components within normal limits  CBC WITH DIFFERENTIAL/PLATELET  URINALYSIS, ROUTINE W REFLEX MICROSCOPIC  CBG MONITORING, ED    EKG None  Radiology CT Abdomen Pelvis W Contrast  Result Date: 10/28/2019 CLINICAL DATA:  Abdominal pain EXAM: CT ABDOMEN AND PELVIS WITH CONTRAST TECHNIQUE:  Multidetector CT imaging of the abdomen and pelvis was performed using the standard protocol following bolus administration of intravenous contrast. CONTRAST:  182m OMNIPAQUE IOHEXOL 300 MG/ML  SOLN COMPARISON:  CT abdomen pelvis dated 11/15/2018 FINDINGS: Lower chest: Mild bibasilar atelectasis. Hepatobiliary: No focal liver abnormality is seen. No gallstones, gallbladder wall thickening, or biliary dilatation. Pancreas: Unremarkable. No pancreatic ductal dilatation or surrounding inflammatory changes. Spleen: Normal in size without focal abnormality. Adrenals/Urinary Tract: Adrenal glands are unremarkable. Kidneys are normal, without renal calculi, focal lesion, or hydronephrosis. Bladder is unremarkable. Stomach/Bowel: Stomach is within normal limits. Appendix appears normal. No evidence of bowel wall thickening, distention, or inflammatory changes. Vascular/Lymphatic: No significant vascular findings are present. No enlarged abdominal or pelvic lymph nodes. Reproductive: Prostate is unremarkable. Other: No abdominal wall hernia or abnormality. No abdominopelvic ascites. Musculoskeletal: No acute osseous findings. Degenerative changes are seen in the spine. IMPRESSION: No acute process in the abdomen or pelvis. Electronically Signed   By: TZerita BoersM.D.   On: 10/28/2019 13:24    Procedures Procedures (including critical care time)  Medications Ordered in ED Medications  sodium chloride 0.9 % bolus 1,000 mL (0 mLs Intravenous Stopped 10/28/19 1329)  morphine 4 MG/ML injection 4 mg (4 mg Intravenous Given 10/28/19 1145)  ondansetron (ZOFRAN) injection 4 mg (4 mg Intravenous Given 10/28/19 1145)  iohexol (OMNIPAQUE) 300 MG/ML solution 100 mL (100 mLs Intravenous Contrast Given 10/28/19 1305)    ED Course  I have reviewed the triage vital signs and the nursing notes.  Pertinent labs & imaging results that were available during my care of the patient were reviewed by me and considered in my medical  decision making (see chart for details).  54year old male appears otherwise well presents for evaluation of left flank pain. He is afebrile, nonseptic, not ill-appearing.  No recent injury or trauma.  Heart and lungs clear.  He does have some mild right flank pain which radiates into his right mid abdomen however negative MPercell Miller  sign, Burney point. Neg CVA tap. No midline spinal tenderness, no red flags for back pain or radicular pain.  CT scan 1 year ago shows no evidence of AAA, kidney stones.  Has been seen by urology at 2 different offices for hematuria.  He is noncompliant with his diabetes medicines.  Pain is not pleuritic, no evidence of DVT on exam.  He is not tachycardic, tachypneic or hypoxic.  Wells criteria low risk.  I have low suspicion for atypical PE as cause of his pain.  Pain does have some sort of MSK component as it is worse when he bends over and able to reproduce his pain on exam.  His only urinary symptom is polyuria however he states this is chronic. Will plan on labs, IVF, pain meds. Shared decision making for imaging given pain x 2 weeks and reproducible on exam. Patient prefers CTAP. Will order and reassess.  Labs and imaging personally reviewed an interpreted.  Patient reassessed. Pain resolved in ED.  Clinical Course as of Oct 28 1355  Sun Oct 28, 2019  1354 No leukocytosis, hemoglobin stable  CBC with Differential [BH]  1610 Metabolic panel panel, specifically creatinine and BUN within normal limits  Comprehensive metabolic panel(!) [BH]  9604 Negative for infection, no blood  Urinalysis, Routine w reflex microscopic [BH]  1355 No acute intra-abdominal process, specifically no stones, hydronephrosis, dilated ureters, bladder wall thickening  CT Abdomen Pelvis W Contrast [BH]    Clinical Course User Index [BH] Marykay Mccleod A, PA-C    Reassessed.  Denies any pain.  I am able to continue to reproduce his pain.  Likely musculoskeletal in nature.  Continues to  deny any cardiac or pulmonary complaints.  No evidence of kidney stones, hydronephrosis, abscess, bacterial infectious process, AAA on imaging.  Labs reassuring with no CKD or AKI.  We will treat for musculoskeletal pain have him follow-up with PCP.  Discussed strict return precautions.  Patient voiced understanding is agreeable for follow-up.  The patient has been appropriately medically screened and/or stabilized in the ED. I have low suspicion for any other emergent medical condition which would require further screening, evaluation or treatment in the ED or require inpatient management.  Patient is hemodynamically stable and in no acute distress.  Patient able to ambulate in department prior to ED.  Evaluation does not show acute pathology that would require ongoing or additional emergent interventions while in the emergency department or further inpatient treatment.  I have discussed the diagnosis with the patient and answered all questions.  Pain is been managed while in the emergency department and patient has no further complaints prior to discharge.  Patient is comfortable with plan discussed in room and is stable for discharge at this time.  I have discussed strict return precautions for returning to the emergency department.  Patient was encouraged to follow-up with PCP/specialist refer to at discharge. MDM Rules/Calculators/A&P                       Final Clinical Impression(s) / ED Diagnoses Final diagnoses:  Flank pain    Rx / DC Orders ED Discharge Orders         Ordered    cyclobenzaprine (FLEXERIL) 10 MG tablet  2 times daily PRN     10/28/19 1353    lidocaine (LIDODERM) 5 %  Every 24 hours     10/28/19 1353           Izzy Courville A, PA-C 10/28/19  [place], MD 10/30/19 1540

## 2019-10-28 NOTE — Discharge Instructions (Signed)
Follow up with your primary care provider if your symptoms do not resolve.  Return to the emergency department for any new or worsening symptoms

## 2019-11-01 ENCOUNTER — Other Ambulatory Visit: Payer: Self-pay

## 2019-11-01 ENCOUNTER — Encounter: Payer: Self-pay | Admitting: Family Medicine

## 2019-11-01 ENCOUNTER — Ambulatory Visit: Payer: Self-pay | Attending: Family Medicine | Admitting: Family Medicine

## 2019-11-01 VITALS — Temp 98.8°F | Ht 69.0 in | Wt 236.0 lb

## 2019-11-01 DIAGNOSIS — Z09 Encounter for follow-up examination after completed treatment for conditions other than malignant neoplasm: Secondary | ICD-10-CM

## 2019-11-01 DIAGNOSIS — R10A1 Flank pain, right side: Secondary | ICD-10-CM

## 2019-11-01 DIAGNOSIS — T148XXA Other injury of unspecified body region, initial encounter: Secondary | ICD-10-CM

## 2019-11-01 DIAGNOSIS — K0889 Other specified disorders of teeth and supporting structures: Secondary | ICD-10-CM

## 2019-11-01 DIAGNOSIS — E119 Type 2 diabetes mellitus without complications: Secondary | ICD-10-CM

## 2019-11-01 DIAGNOSIS — R109 Unspecified abdominal pain: Secondary | ICD-10-CM

## 2019-11-01 LAB — POCT GLYCOSYLATED HEMOGLOBIN (HGB A1C): Hemoglobin A1C: 6.1 % — AB (ref 4.0–5.6)

## 2019-11-01 MED ORDER — CYCLOBENZAPRINE HCL 5 MG PO TABS: ORAL_TABLET | ORAL | 1 refills | Status: DC

## 2019-11-01 MED ORDER — AMOXICILLIN-POT CLAVULANATE 500-125 MG PO TABS: 500.0000 mg | ORAL_TABLET | Freq: Two times a day (BID) | ORAL | 0 refills | Status: DC

## 2019-11-01 MED ORDER — IBUPROFEN 600 MG PO TABS: 600.0000 mg | ORAL_TABLET | Freq: Three times a day (TID) | ORAL | 0 refills | Status: DC | PRN

## 2019-11-01 MED ORDER — METRONIDAZOLE 500 MG PO TABS: 500.0000 mg | ORAL_TABLET | Freq: Two times a day (BID) | ORAL | 0 refills | Status: DC

## 2019-11-01 MED FILL — CYCLOBENZAPRINE 5 MG TABLET: 5 | 5 days supply | Qty: 30 | Fill #0

## 2019-11-01 MED FILL — AMOX-CLAV 500-125 MG TABLET: 500-125 | 10 days supply | Qty: 20 | Fill #0

## 2019-11-01 MED FILL — IBUPROFEN 600 MG TABLET: 600 | 10 days supply | Qty: 30 | Fill #0

## 2019-11-01 MED FILL — metroNIDAZOLE 500 MG TABS: 500 | 10 days supply | Qty: 20 | Fill #0

## 2019-11-01 NOTE — Progress Notes (Signed)
Subjective:  Patient ID: Dustin Hale, male    DOB: Mar 30, 1966  Age: 54 y.o. MRN: [id number]  CC: Follow-up   HPI Dustin Hale, 54 year old African-American male who is status post telehealth visit on 09/14/2019, due to the need for referral to a dentist secondary to poor dentition.  Also discussed with the patient at that visit that his urologist had suggested a sleep study in follow-up of patient's issues with nocturia as patient had been contacted by the sleep center and declined.  He is now status post emergency department visit on 10/28/2019 for evaluation of right flank pain that had become constant and throbbing per ED records.  CBC and CMP done in the emergency department were normal, glucose of 103. UA was also normal.  CT of the abdomen and pelvis showed no acute process.  He did have some evidence of degenerative changes in the spine.         Patient initially states that he continues to have pain in his kidneys but places his hand on his right mid lateral abdomen as the area of his pain.  He reports that the pain is worse with bending or twisting.  He reports that the pain has been present for about 2 months but over the past few weeks has become more constant in nature.  When he bends over to tie shoes, twists his upper body to either side or is picking up/holding his grandson, he will have onset of sharp pain that is about a 12 on a 0-to-10 scale.  Pain is otherwise about a 4-6 and sometimes he has no pain at all when sitting still.  He reports that he cannot afford the prescription for one of the medications prescribed to the emergency department as it cost $200 for both medications so he has not filled the prescriptions from the emergency department but he does have them with him at today's visit.          He has seen no additional blood in the urine, no changes in appetite or bowel habits.  He denies any nausea/vomiting/diarrhea or constipation.  No fever or chills.  No loss of appetite.  No  acid reflux symptoms.  He denies any prior abdominal surgery or any other surgery.  He did have colonoscopy with removal of polyps done within the past 12 months.  He has had no blood in the stool and no black stools.  He does continue to get up at least twice per night to urinate.  Pain does not change with urination.  He does not have any burning with urination and otherwise no frequency of urination.  He continues to take his diabetes medication, Metformin and has had no issues tolerating the medication and taking the medication does not have any effect on his abdominal pain.  He does report that since over-the-counter medications were not helping his pain, he has started drinking alcohol before bedtime in order to get some relief from the pain in order to sleep.  Past Medical History:  Diagnosis Date  . Diabetes mellitus without complication ([place])   . Hyperlipidemia   . Obesity   . Poor dental hygiene     Past Surgical History:  Procedure Laterality Date  . NO PAST SURGERIES      Family History  Problem Relation Age of Onset  . Hypertension Mother   . Other Mother        fire  . Hypertension Father   . Other Father  car crash  . Hypertension Maternal Grandmother   . Heart attack Maternal Grandmother   . Arthritis Maternal Grandmother   . Hypertension Maternal Grandfather   . Hypertension Paternal Grandmother   . Stroke Other   . Colon cancer Neg Hx   . Rectal cancer Neg Hx   . Stomach cancer Neg Hx     Social History   Tobacco Use  . Smoking status: Former Smoker    Packs/day: 0.33    Years: 32.00    Pack years: 10.56    Types: Cigarettes  . Smokeless tobacco: Never Used  . Tobacco comment: quit smoking on 08/27/14  Substance Use Topics  . Alcohol use: Yes    Alcohol/week: 0.0 standard drinks    Comment: occasional     ROS Review of Systems  Constitutional: Positive for fatigue. Negative for chills and fever.  HENT: Negative for sore throat and trouble  swallowing.   Eyes: Negative for photophobia and visual disturbance.  Respiratory: Negative for cough and shortness of breath.   Cardiovascular: Negative for chest pain, palpitations and leg swelling.  Gastrointestinal: Positive for abdominal pain. Negative for blood in stool, constipation, diarrhea and nausea.  Endocrine: Negative for polydipsia and polyphagia.  Genitourinary: Positive for flank pain. Negative for discharge, dysuria, frequency, hematuria and urgency.  Musculoskeletal: Positive for back pain (Right-sided flank pain). Negative for arthralgias.  Skin: Negative for rash and wound.  Neurological: Negative for dizziness and headaches.  Hematological: Negative for adenopathy. Does not bruise/bleed easily.  Psychiatric/Behavioral: Positive for sleep disturbance (Secondary to pain). Negative for self-injury and suicidal ideas. The patient is nervous/anxious (About his health).     Objective:   Today's Vitals: Temp 98.8 F (37.1 C) (Oral)   Ht 5' 9"  (1.753 m)   Wt 236 lb (107 kg)   BMI 34.85 kg/m   Physical Exam Vitals and nursing note reviewed.  Constitutional:      General: He is not in acute distress.    Appearance: Normal appearance.  Cardiovascular:     Rate and Rhythm: Normal rate and regular rhythm.  Pulmonary:     Effort: Pulmonary effort is normal.     Breath sounds: Normal breath sounds.  Abdominal:     General: Bowel sounds are normal. There is distension.     Palpations: Abdomen is soft. There is no mass.     Tenderness: There is abdominal tenderness. There is no right CVA tenderness, left CVA tenderness, guarding or rebound.     Hernia: No hernia is present.     Comments: Patient with right lateral flank/abdominal discomfort to palpation and has 1 area in particular where he has reproducibility of pain with palpation as well as similar pain with rotation of the torso or if he bends over.  At 1 point, he did have some mild discomfort in the right upper  quadrant with palpation on the inferior liver border but later denied any pain in this area.  Musculoskeletal:        General: Tenderness present.     Right lower leg: No edema.     Left lower leg: No edema.     Comments: Patient with discomfort along the right mid lateral abdomen/flank; no rebound or guarding.  Negative supine leg raise.    Skin:    General: Skin is warm and dry.     Findings: No bruising, lesion or rash.  Neurological:     General: No focal deficit present.     Mental Status: He  is alert and oriented to person, place, and time.  Psychiatric:     Comments: Patient was initially sullen, angry and slightly agitated but became more open and calmer/less angry while at today's visit     Assessment & Plan:  1. Encounter for examination following treatment at hospital; 3.  Acute right flank pain; 4.  Muscular strain -Patient is status post emergency department visit on 10/28/2019 for right abdominal/flank pain.  I reviewed findings from patient's emergency department visit in detail with the patient at today's visit.  He had a urinalysis which did not show any blood in the urine.  His comprehensive metabolic panel, electrolytes and liver enzymes were normal.  His glucose was 103 at that time.  He also had a normal complete blood count.  He additionally had a CT of the abdomen and pelvis which did not show any acute abnormalities/processes.  He did have some degenerative spinal changes.  His pain is also reproducible when he performs twisting or bending movements. -Discussed with patient that there was no evidence of renal abnormality, kidney stones or urinary tract infection.  Patient has been evaluated by urology and they suggested that patient have sleep study in follow-up of his issues with nocturia.  On review of chart, patient had also had enlarged prostate on prior CT but on the most recent CT done during his emergency department visit on 10/28/2019, his prostate was deemed to be  of normal size by radiology.  Discussed with patient that the area of his pain does not correlate with the location of his kidneys and that his kidneys appeared normal on his recent CT scan. -Dr. Judeth Horn, surgeon, was also at our office this afternoon and I discussed the patient with Dr. Hulen Skains who agreed to come in to perform an examination on the patient and Dr. Hulen Skains felt that patient's pain was musculoskeletal in origin and Dr. Dema Severin also reviewed patient's recent CT scan to look for possible abnormality or hernia that may have been missed by radiology but he did not see any abnormalities that would explain patient's pain as having a urologic or abdominal origin. -Discussed with patient in depth that his pain was most likely musculoskeletal in origin and prescriptions provided for Flexeril 5 mg to take every 6-8 hours as needed for muscle spasm/flank pain and he may take up to 2 at bedtime to help with sleep.  He should discontinue the use of alcohol to help with pain.  Prescription provided for ibuprofen that he may take every 6 hours as needed for pain but he was also made aware that he should eat prior to taking the medication to help avoid stomach pain/gastritis/increased risk of GI bleed. -If he has any acute worsening of abdominal pain he is aware that he can follow-up with the emergency department again.  He should report any blood in the stool or black stools and return for more immediate follow-up. -If pain is not resolving, suggested follow-up with gastroenterology as he is status post removal of colonic polyps.  He additionally did have 1 large colonic diverticula in the ascending colon which could potentially cause pain if there was presence of infection/abscess but again, no abnormality was seen on CT scan at his recent emergency department visit and his complete blood count was normal.  If he continues to have pain however discussed possibility of antibiotic treatment for presumed  diverticulitis and follow-up with GI.  He is to return to clinic in the next 1 to 2 weeks  for continued follow-up of abdominal pain, sooner if needed. - ibuprofen (ADVIL) 600 MG tablet; Take 1 tablet (600 mg total) by mouth every 8 (eight) hours as needed for moderate pain. Eat before taking medication  Dispense: 30 tablet; Refill: 0 - cyclobenzaprine (FLEXERIL) 5 MG tablet; One tablet every 6-8 hours as needed for muscle spasm and 2 at bedtime  Dispense: 30 tablet; Refill: 1   2.  Type 2 diabetes without complication, without long-term use of insulin He reports that he is try to remain compliant with a diabetic, low carbohydrate diet as well as use of metformin.  On recent comprehensive metabolic panel in the emergency department, his glucose was 103 with otherwise normal electrolytes and normal liver enzymes. His last hemoglobin A1c was 6.6 on 05/24/2019 showing very good control of his diabetes.  He will have repeat hemoglobin A1c at today's visit.  Hemoglobin A1c was 6.1 showing continued good control of his type 2 diabetes and he will continue current medications, low carbohydrate diet and exercise with goal of weight loss. -HgbA1c   Outpatient Encounter Medications as of 11/01/2019  Medication Sig  . blood glucose meter kit and supplies KIT Dispense based on patient and insurance preference. Use up to four times daily as directed. (FOR ICD-9 250.00, 250.01).  . Blood Glucose Monitoring Suppl (TRUE METRIX METER) W/DEVICE KIT 1 each by Does not apply route as needed.  . cyclobenzaprine (FLEXERIL) 10 MG tablet Take 1 tablet (10 mg total) by mouth 2 (two) times daily as needed for muscle spasms.  Marland Kitchen lidocaine (LIDODERM) 5 % Place 1 patch onto the skin daily. Remove & Discard patch within 12 hours or as directed by MD  . metFORMIN (GLUCOPHAGE) 500 MG tablet Take 2 pills once per day after the evening meal  . simvastatin (ZOCOR) 20 MG tablet One pill by mouth each evening to help lower cholesterol  .  tamsulosin (FLOMAX) 0.4 MG CAPS capsule Take 1 capsule (0.4 mg total) by mouth daily.  Marland Kitchen terbinafine (LAMISIL AT) 1 % cream Apply 1 application topically 2 (two) times daily. X 10 days and then as needed  . TRUEPLUS LANCETS 28G MISC 1 each by Does not apply route 3 (three) times daily.  . bisacodyl (BISACODYL) 5 MG EC tablet Take 5 mg by mouth once. Dulcolax 5 mg tab take as directed for colonoscopy prep.  Marland Kitchen glucose blood (TRUE METRIX BLOOD GLUCOSE TEST) test strip 1 each by Other route 3 (three) times daily.  . [DISCONTINUED] ciprofloxacin (CIPRO) 500 MG tablet Take 1 tablet (500 mg total) by mouth 2 (two) times daily. (Patient not taking: Reported on 09/14/2019)   No facility-administered encounter medications on file as of 11/01/2019.    An After Visit Summary was printed and given to the patient.   Follow-up: Return in about 2 weeks (around 11/15/2019) for flank pain; muscle strain.  More than 60 minutes of total time was spent with the patient to obtain history of present illness, perform examination, explained and discussed findings with the patient from his recent emergency department visit, discussion of assessment and plan as well as accompanying Dr. Judeth Horn when he performed exam on patient and review of CT scan imaging from patient's recent emergency department visit.  Additional 20 minutes was spent on review of patient's chart, labs, orders and completion of today's visit encounter.  Antony Blackbird MD

## 2019-11-01 NOTE — Progress Notes (Signed)
Patient has seen urology but continues to complain of worsened right side back pain.  Patient complains of bleeding still being in his urine.  Last visit with urology was this past January and was advised to return in 72mths

## 2019-11-01 NOTE — Patient Instructions (Addendum)
Diverticulitis  Diverticulitis is when small pockets in your large intestine (colon) get infected or swollen. This causes stomach pain and watery poop (diarrhea). These pouches are called diverticula. They form in people who have a condition called diverticulosis. Follow these instructions at home: Medicines  Take over-the-counter and prescription medicines only as told by your doctor. These include: ? Antibiotics. ? Pain medicines. ? Fiber pills. ? Probiotics. ? Stool softeners.  Do not drive or use heavy machinery while taking prescription pain medicine.  If you were prescribed an antibiotic, take it as told. Do not stop taking it even if you feel better. General instructions   Follow a diet as told by your doctor.  When you feel better, your doctor may tell you to change your diet. You may need to eat a lot of fiber. Fiber makes it easier to poop (have bowel movements). Healthy foods with fiber include: ? Berries. ? Beans. ? Lentils. ? Green vegetables.  Exercise 3 or more times a week. Aim for 30 minutes each time. Exercise enough to sweat and make your heart beat faster.  Keep all follow-up visits as told. This is important. You may need to have an exam of the large intestine. This is called a colonoscopy. Contact a doctor if:  Your pain does not get better.  You have a hard time eating or drinking.  You are not pooping like normal. Get help right away if:  Your pain gets worse.  Your problems do not get better.  Your problems get worse very fast.  You have a fever.  You throw up (vomit) more than one time.  You have poop that is: ? Bloody. ? Black. ? Tarry. Summary  Diverticulitis is when small pockets in your large intestine (colon) get infected or swollen.  Take medicines only as told by your doctor.  Follow a diet as told by your doctor. This information is not intended to replace advice given to you by your health care provider. Make sure you  discuss any questions you have with your health care provider. Document Revised: 08/05/2017 Document Reviewed: 09/09/2016 Elsevier Patient Education  2020 [place].  Muscle Strain A muscle strain is an injury that happens when a muscle is stretched longer than normal. This can happen during a fall, sports, or lifting. This can tear some muscle fibers. Usually, recovery from muscle strain takes 1-2 weeks. Complete healing normally takes 5-6 weeks. This condition is first treated with PRICE therapy. This involves:  Protecting your muscle from being injured again.  Resting your injured muscle.  Icing your injured muscle.  Applying pressure (compression) to your injured muscle. This may be done with a splint or elastic bandage.  Raising (elevating) your injured muscle. Your doctor may also recommend medicine for pain. Follow these instructions at home: If you have a splint:  Wear the splint as told by your doctor. Take it off only as told by your doctor.  Loosen the splint if your fingers or toes tingle, get numb, or turn cold and blue.  Keep the splint clean.  If the splint is not waterproof: ? Do not let it get wet. ? Cover it with a watertight covering when you take a bath or a shower. Managing pain, stiffness, and swelling   If directed, put ice on your injured area. ? If you have a removable splint, take it off as told by your doctor. ? Put ice in a plastic bag. ? Place a towel between your skin and  the bag. ? Leave the ice on for 20 minutes, 2-3 times a day.  Move your fingers or toes often. This helps to avoid stiffness and lessen swelling.  Raise your injured area above the level of your heart while you are sitting or lying down.  Wear an elastic bandage as told by your doctor. Make sure it is not too tight. General instructions  Take over-the-counter and prescription medicines only as told by your doctor.  Limit your activity. Rest your injured muscle as told  by your doctor. Your doctor may say that gentle movements are okay.  If physical therapy was prescribed, do exercises as told by your doctor.  Do not put pressure on any part of the splint until it is fully hardened. This may take many hours.  Do not use any products that contain nicotine or tobacco, such as cigarettes and e-cigarettes. These can delay bone healing. If you need help quitting, ask your doctor.  Warm up before you exercise. This helps to prevent more muscle strains.  Ask your doctor when it is safe to drive if you have a splint.  Keep all follow-up visits as told by your doctor. This is important. Contact a doctor if:  You have more pain or swelling in your injured area. Get help right away if:  You have any of these problems in your injured area: ? You have numbness. ? You have tingling. ? You lose a lot of strength. Summary  A muscle strain is an injury that happens when a muscle is stretched longer than normal.  This condition is first treated with PRICE therapy. This includes protecting, resting, icing, adding pressure, and raising your injury.  Limit your activity. Rest your injured muscle as told by your doctor. Your doctor may say that gentle movements are okay.  Warm up before you exercise. This helps to prevent more muscle strains. This information is not intended to replace advice given to you by your health care provider. Make sure you discuss any questions you have with your health care provider. Document Revised: 10/19/2018 Document Reviewed: 09/29/2016 Elsevier Patient Education  [place].

## 2019-11-15 ENCOUNTER — Ambulatory Visit: Payer: Self-pay | Admitting: Family Medicine

## 2020-01-22 ENCOUNTER — Telehealth: Payer: Self-pay

## 2020-01-22 NOTE — Telephone Encounter (Signed)
That's fine or patient can be directed to go to urgent care for sooner evaluation

## 2020-01-22 NOTE — Telephone Encounter (Signed)
Pt states LEFT big toe suddenly started throbbing & became very painful for no reason. No injury. Pt request referral. PCP unavailable. Scheduled pt with Amy for evaluation 01/29/20. Please advise.

## 2020-01-23 NOTE — Telephone Encounter (Signed)
Informed patient with what provider stated and he verbalized understanding and stated he'll wait for his appt

## 2020-01-29 ENCOUNTER — Other Ambulatory Visit: Payer: Self-pay

## 2020-01-29 ENCOUNTER — Ambulatory Visit: Payer: Self-pay | Attending: Family | Admitting: Family

## 2020-01-29 DIAGNOSIS — Z91199 Patient's noncompliance with other medical treatment and regimen due to unspecified reason: Secondary | ICD-10-CM

## 2020-01-29 DIAGNOSIS — Z5329 Procedure and treatment not carried out because of patient's decision for other reasons: Secondary | ICD-10-CM

## 2020-01-29 NOTE — Progress Notes (Signed)
Patient did not show for appointment. Called patient at 25, 25 with voicemail, and [phone] with no response.

## 2020-03-04 ENCOUNTER — Encounter: Payer: Self-pay | Admitting: Physician Assistant

## 2020-03-04 ENCOUNTER — Ambulatory Visit: Payer: Self-pay | Admitting: Physician Assistant

## 2020-05-05 ENCOUNTER — Ambulatory Visit (HOSPITAL_BASED_OUTPATIENT_CLINIC_OR_DEPARTMENT_OTHER): Payer: Self-pay | Admitting: Family

## 2020-05-05 DIAGNOSIS — Z5329 Procedure and treatment not carried out because of patient's decision for other reasons: Secondary | ICD-10-CM

## 2020-05-05 NOTE — Progress Notes (Signed)
Patient did not show for appointment.   

## 2020-07-16 ENCOUNTER — Other Ambulatory Visit: Payer: Self-pay

## 2020-07-16 ENCOUNTER — Encounter: Payer: Self-pay | Admitting: Family Medicine

## 2020-07-16 ENCOUNTER — Other Ambulatory Visit: Payer: Self-pay | Admitting: Family Medicine

## 2020-07-16 ENCOUNTER — Ambulatory Visit: Payer: Self-pay | Attending: Family Medicine | Admitting: Family Medicine

## 2020-07-16 VITALS — BP 115/80 | HR 78 | Wt 225.0 lb

## 2020-07-16 DIAGNOSIS — E1165 Type 2 diabetes mellitus with hyperglycemia: Secondary | ICD-10-CM

## 2020-07-16 DIAGNOSIS — E785 Hyperlipidemia, unspecified: Secondary | ICD-10-CM

## 2020-07-16 DIAGNOSIS — R319 Hematuria, unspecified: Secondary | ICD-10-CM

## 2020-07-16 LAB — POCT URINALYSIS DIP (CLINITEK)
Bilirubin, UA: NEGATIVE
Glucose, UA: >=1000 mg/dL — AB
Leukocytes, UA: NEGATIVE
Nitrite, UA: NEGATIVE
POC PROTEIN,UA: NEGATIVE
Spec Grav, UA: 1.01
Urobilinogen, UA: 0.2 U/dL
pH, UA: 5

## 2020-07-16 LAB — POCT GLYCOSYLATED HEMOGLOBIN (HGB A1C): Hemoglobin A1C: 12.4 % — AB (ref 4.0–5.6)

## 2020-07-16 LAB — GLUCOSE, POCT (MANUAL RESULT ENTRY)
POC Glucose: 367 mg/dL — AB (ref 70–99)
POC Glucose: 364 mg/dL — AB (ref 70–99)

## 2020-07-16 MED ORDER — TRUEPLUS LANCETS 28G MISC: 1.0000 | Freq: Three times a day (TID) | 11 refills | Status: DC

## 2020-07-16 MED ORDER — METFORMIN HCL ER 500 MG PO TB24: ORAL_TABLET | ORAL | 4 refills | Status: DC

## 2020-07-16 MED ORDER — SIMVASTATIN 20 MG PO TABS: ORAL_TABLET | ORAL | 11 refills | Status: DC

## 2020-07-16 MED ORDER — GLIMEPIRIDE 4 MG PO TABS: ORAL_TABLET | ORAL | 4 refills | Status: DC

## 2020-07-16 MED ORDER — TRUE METRIX BLOOD GLUCOSE TEST VI STRP: 1.0000 | ORAL_STRIP | Freq: Three times a day (TID) | 11 refills | Status: DC

## 2020-07-16 MED ORDER — INSULIN REGULAR HUMAN 100 UNIT/ML IJ SOLN: 12.0000 [IU] | Freq: Once | INTRAMUSCULAR | Status: DC

## 2020-07-16 MED FILL — GLIMEPIRIDE 4 MG TABS: 4 | 30 days supply | Qty: 30 | Fill #0

## 2020-07-16 MED FILL — metFORMIN HCL ER 500 MG TB2: 500 | 30 days supply | Qty: 60 | Fill #0

## 2020-07-16 MED FILL — TRUEplus LANCETS 28G MISC: 30 days supply | Qty: 100 | Fill #0

## 2020-07-16 MED FILL — TRUE METRIX TEST STRIP: 30 days supply | Qty: 100 | Fill #0

## 2020-07-16 NOTE — Progress Notes (Signed)
Pt needs refill on all meds except metformin

## 2020-07-16 NOTE — Patient Instructions (Signed)
Hyperglycemia Hyperglycemia is when the sugar (glucose) level in your blood is too high. It may not cause symptoms. If you do have symptoms, they may include warning signs, such as:  Feeling more thirsty than normal.  Hunger.  Feeling tired.  Needing to pee (urinate) more than normal.  Blurry eyesight (vision). You may get other symptoms as it gets worse, such as:  Dry mouth.  Not being hungry (loss of appetite).  Fruity-smelling breath.  Weakness.  Weight gain or loss that is not planned. Weight loss may be fast.  A tingling or numb feeling in your hands or feet.  Headache.  Skin that does not bounce back quickly when it is lightly pinched and released (poor skin turgor).  Pain in your belly (abdomen).  Cuts or bruises that heal slowly. High blood sugar can happen to people who do or do not have diabetes. High blood sugar can happen slowly or quickly, and it can be an emergency. Follow these instructions at home: General instructions  Take over-the-counter and prescription medicines only as told by your doctor.  Do not use products that contain nicotine or tobacco, such as cigarettes and e-cigarettes. If you need help quitting, ask your doctor.  Limit alcohol intake to no more than 1 drink per day for nonpregnant women and 2 drinks per day for men. One drink equals 12 oz of beer, 5 oz of wine, or 1 oz of hard liquor.  Manage stress. If you need help with this, ask your doctor.  Keep all follow-up visits as told by your doctor. This is important. Eating and drinking   Stay at a healthy weight.  Exercise regularly, as told by your doctor.  Drink enough fluid, especially when you: ? Exercise. ? Get sick. ? Are in hot temperatures.  Eat healthy foods, such as: ? Low-fat (lean) proteins. ? Complex carbs (complex carbohydrates), such as whole wheat bread or brown rice. ? Fresh fruits and vegetables. ? Low-fat dairy products. ? Healthy fats.  Drink enough  fluid to keep your pee (urine) clear or pale yellow. If you have diabetes:   Make sure you know the symptoms of hyperglycemia.  Follow your diabetes management plan, as told by your doctor. Make sure you: ? Take insulin and medicines as told. ? Follow your exercise plan. ? Follow your meal plan. Eat on time. Do not skip meals. ? Check your blood sugar as often as told. Make sure to check before and after exercise. If you exercise longer or in a different way than you normally do, check your blood sugar more often. ? Follow your sick day plan whenever you cannot eat or drink normally. Make this plan ahead of time with your doctor.  Share your diabetes management plan with people in your workplace, school, and household.  Check your urine for ketones when you are ill and as told by your doctor.  Carry a card or wear jewelry that says that you have diabetes. Contact a doctor if:  Your blood sugar level is higher than 240 mg/dL (13.3 mmol/L) for 2 days in a row.  You have problems keeping your blood sugar in your target range.  High blood sugar happens often for you. Get help right away if:  You have trouble breathing.  You have a change in how you think, feel, or act (mental status).  You feel sick to your stomach (nauseous), and that feeling does not go away.  You cannot stop throwing up (vomiting). These symptoms may   be an emergency. Do not wait to see if the symptoms will go away. Get medical help right away. Call your local emergency services (911 in the [place] Do not drive yourself to the hospital. Summary  Hyperglycemia is when the sugar (glucose) level in your blood is too high.  High blood sugar can happen to people who do or do not have diabetes.  Make sure you drink enough fluids, eat healthy foods, and exercise regularly.  Contact your doctor if you have problems keeping your blood sugar in your target range. This information is not intended to replace advice given  to you by your health care provider. Make sure you discuss any questions you have with your health care provider. Document Revised: 05/10/2016 Document Reviewed: 05/10/2016 Elsevier Patient Education  2020 [place] Following a diabetes action plan is a way for you to manage your diabetes (diabetes mellitus) symptoms. The plan is color-coded to help you understand what actions you need to take based on any symptoms you are having.  If you have symptoms in the red zone, you need medical care right away.  If you have symptoms in the yellow zone, it means you are having problems.  If you have symptoms in the green zone, you are doing well. Learning about and understanding diabetes can take time. Follow the plan that you develop with your health care provider. Know the target range for your blood sugar (glucose) level, and review your treatment plan with your health care provider at each visit. The target range for my blood sugar level is __________________________ mg/dL. Red zone Get medical help right away if you have any of the following symptoms:  A blood sugar test result that is below 54 mg/dL (3 mmol/L).  A blood sugar test result that is at or above 240 mg/dL (13.3 mmol/L) for 2 days in a row.  Confusion or trouble thinking clearly.  Difficulty breathing.  Sickness or a fever for 2 or more days that is not getting better.  Moderate or large ketone levels in your urine. If you have any red zone symptoms, call emergency services (911 in the [place] or go to the nearest emergency room. If you have severely low blood sugar (severe hypoglycemia) and you cannot eat or drink, you may need an injection of glucagon. Make sure a family member or close friend knows how to check your blood sugar and how to give you a glucagon injection. You may need to be treated in a hospital for this condition. Yellow zone If you have any of the following symptoms, your diabetes is  not under control and you may need to make some changes:  Blood sugar test results that are below 70 mg/dL (3.9 mmol/L).  Other symptoms of hypoglycemia, such as: ? Shaking or feeling light-headed. ? Confusion or irritability. ? Feeling hungry. ? Having a fast heartbeat.  A blood sugar test result that is higher than 240 mg/dL (13.3 mmol/L) for 2 days in a row.  A fever.  Feeling tired, or not having any energy. If you have any yellow zone symptoms:  Treat your low blood sugar (hypoglycemia) by eating or drinking 15 grams of a rapid-acting carbohydrate. Follow the 15:15 rule: ? Take 15 grams of a rapid-acting carbohydrate, such as:  1 tube of glucose gel.  3 glucose pills.  6-8 pieces of hard candy.  4 oz (120 mL) of fruit juice.  4 oz (120 mL) of regular (not diet) soda. ?  Check your blood sugar 15 minutes after you take the carbohydrate. ? If the repeat blood sugar test is still at or below 70 mg/dL (3.9 mmol/L), take 15 grams of a carbohydrate again. ? If your blood sugar does not increase above 70 mg/dL (3.9 mmol/L) after 3 tries, get medical help right away. ? After your blood sugar returns to normal, eat a meal or a snack within 1 hour.  Keep taking your daily medicines as directed.  Check your blood sugar more often than you normally would. ? Write down your results. ? Call your health care provider if you have trouble keeping your blood sugar in your target range.  Green zone These signs mean you are doing well and you can continue what you are doing to manage your diabetes:  Your blood sugar is within your personal target range. For most people, a blood sugar level before a meal (preprandial) should be 80-130 mg/dL.  You feel well, and you are able to do daily activities. If you are in the green zone, continue to manage your diabetes as directed. To do this:  Eat a healthy diet.  Exercise regularly.  Check your blood sugar as directed.  Take your  medicines as directed.  Where to find more information You can find more information about diabetes from:  American Diabetes Association (ADA): www.diabetes.org  American Association of Diabetes Educators (AADE): www.diabeteseducator.org Summary  Following a diabetes action plan is a way for you to manage your diabetes symptoms. The plan is color-coded to help you understand what actions you need to take based on any symptoms you are having.  Follow the plan that you develop with your health care provider. Make sure you know your personal target blood sugar level.  Review your treatment plan with your health care provider at each visit. This information is not intended to replace advice given to you by your health care provider. Make sure you discuss any questions you have with your health care provider. Document Revised: 10/21/2017 Document Reviewed: 06/15/2017 Elsevier Patient Education  2020 [employer].

## 2020-07-16 NOTE — Progress Notes (Signed)
Established Patient Office Visit  Subjective:  Patient ID: Dustin Hale, male    DOB: May 13, 1966  Age: 54 y.o. MRN: [id number]  CC:  Chief Complaint  Patient presents with  . Follow-up    HPI Dustin Hale, 54 yo male who is seen in follow-up of chronic medical issues including type 2 diabetes, hyperlipidemia and history of hematuria but has been out of his medications with the exception of metformin which he takes intermittently due to GI side effects. He reports that he has been eating more junk food.  Because his blood sugars have been so well controlled in the past, he reports that he has not been checking his blood sugars at home.  He has noticed that he has had an increase in fatigue as well as constantly feeling hungry.  He finds himself still craving food/still hungry even after he is recently eaten.  He also feels that he has had an increase in thirst and an increase in urination.  He denies any blurred vision.          He reports that he has had no episodes of visible blood in the urine recently but continues to have some right mid back pain for which he was seen at his last visit.  He has had urinary frequency but no dysuria.  He has been out of his simvastatin for treatment of hyperlipidemia.  Past Medical History:  Diagnosis Date  . Diabetes mellitus without complication ([place])   . Hyperlipidemia   . Obesity   . Poor dental hygiene     Past Surgical History:  Procedure Laterality Date  . NO PAST SURGERIES      Family History  Problem Relation Age of Onset  . Hypertension Mother   . Other Mother        fire  . Hypertension Father   . Other Father        car crash  . Hypertension Maternal Grandmother   . Heart attack Maternal Grandmother   . Arthritis Maternal Grandmother   . Hypertension Maternal Grandfather   . Hypertension Paternal Grandmother   . Stroke Other   . Colon cancer Neg Hx   . Rectal cancer Neg Hx   . Stomach cancer Neg Hx     Social History    Socioeconomic History  . Marital status: Single    Spouse name: Not on file  . Number of children: 4  . Years of education: 2 yrs college  . Highest education level: Not on file  Occupational History  . Not on file  Tobacco Use  . Smoking status: Former Smoker    Packs/day: 0.33    Years: 32.00    Pack years: 10.56    Types: Cigarettes  . Smokeless tobacco: Never Used  . Tobacco comment: quit smoking on 08/27/14  Vaping Use  . Vaping Use: Never used  Substance and Sexual Activity  . Alcohol use: Yes    Alcohol/week: 0.0 standard drinks    Comment: occasional   . Drug use: Not Currently    Types: Marijuana    Comment: Last use August 2019  . Sexual activity: Yes    Partners: Female  Other Topics Concern  . Not on file  Social History Narrative   Lives with sig other   No caffeine   Social Determinants of Health   Financial Resource Strain:   . Difficulty of Paying Living Expenses: Not on file  Food Insecurity:   . Worried About Estate manager/land agent  of Food in the Last Year: Not on file  . Ran Out of Food in the Last Year: Not on file  Transportation Needs:   . Lack of Transportation (Medical): Not on file  . Lack of Transportation (Non-Medical): Not on file  Physical Activity:   . Days of Exercise per Week: Not on file  . Minutes of Exercise per Session: Not on file  Stress:   . Feeling of Stress : Not on file  Social Connections:   . Frequency of Communication with Friends and Family: Not on file  . Frequency of Social Gatherings with Friends and Family: Not on file  . Attends Religious Services: Not on file  . Active Member of Clubs or Organizations: Not on file  . Attends Archivist Meetings: Not on file  . Marital Status: Not on file  Intimate Partner Violence:   . Fear of Current or Ex-Partner: Not on file  . Emotionally Abused: Not on file  . Physically Abused: Not on file  . Sexually Abused: Not on file    Outpatient Medications Prior to Visit   Medication Sig Dispense Refill  . blood glucose meter kit and supplies KIT Dispense based on patient and insurance preference. Use up to four times daily as directed. (FOR ICD-9 250.00, 250.01). 1 each 2  . Blood Glucose Monitoring Suppl (TRUE METRIX METER) W/DEVICE KIT 1 each by Does not apply route as needed. 1 kit 0  . cyclobenzaprine (FLEXERIL) 5 MG tablet One tablet every 6-8 hours as needed for muscle spasm and 2 at bedtime 30 tablet 1  . glucose blood (TRUE METRIX BLOOD GLUCOSE TEST) test strip 1 each by Other route 3 (three) times daily. 100 each 11  . ibuprofen (ADVIL) 600 MG tablet Take 1 tablet (600 mg total) by mouth every 8 (eight) hours as needed for moderate pain. Eat before taking medication 30 tablet 0  . lidocaine (LIDODERM) 5 % Place 1 patch onto the skin daily. Remove & Discard patch within 12 hours or as directed by MD 30 patch 0  . metFORMIN (GLUCOPHAGE) 500 MG tablet Take 2 pills once per day after the evening meal 60 tablet 5  . simvastatin (ZOCOR) 20 MG tablet One pill by mouth each evening to help lower cholesterol 30 tablet 11  . tamsulosin (FLOMAX) 0.4 MG CAPS capsule Take 1 capsule (0.4 mg total) by mouth daily. 30 capsule 11  . terbinafine (LAMISIL AT) 1 % cream Apply 1 application topically 2 (two) times daily. X 10 days and then as needed 30 g 0  . TRUEPLUS LANCETS 28G MISC 1 each by Does not apply route 3 (three) times daily. 100 each 11  . bisacodyl (BISACODYL) 5 MG EC tablet Take 5 mg by mouth once. Dulcolax 5 mg tab take as directed for colonoscopy prep. (Patient not taking: Reported on 07/16/2020)     No facility-administered medications prior to visit.    No Known Allergies  ROS Review of Systems  Constitutional: Positive for appetite change and fatigue. Negative for chills and fever.  HENT: Negative for sore throat and trouble swallowing.   Eyes: Negative for photophobia and visual disturbance.  Respiratory: Negative for cough and shortness of breath.    Cardiovascular: Negative for chest pain, palpitations and leg swelling.  Gastrointestinal: Negative for abdominal pain, constipation, diarrhea and nausea.  Endocrine: Positive for polydipsia, polyphagia and polyuria.  Genitourinary: Positive for frequency. Negative for dysuria.  Musculoskeletal: Positive for back pain (right flank pain). Negative  for arthralgias.  Skin: Negative for rash and wound.  Neurological: Positive for weakness (possibly in left leg when going up stairs). Negative for dizziness, numbness and headaches.  Hematological: Negative for adenopathy. Does not bruise/bleed easily.  Psychiatric/Behavioral: Negative for suicidal ideas. The patient is not nervous/anxious.       Objective:    Physical Exam Vitals and nursing note reviewed.  Constitutional:      General: He is not in acute distress.    Appearance: Normal appearance.     Comments: Well-nourished well-developed overweight male in no acute distress  Neck:     Vascular: No carotid bruit.  Cardiovascular:     Rate and Rhythm: Normal rate and regular rhythm.     Pulses:          Dorsalis pedis pulses are 1+ on the right side and 1+ on the left side.       Posterior tibial pulses are 1+ on the right side and 1+ on the left side.  Pulmonary:     Effort: Pulmonary effort is normal.     Breath sounds: Normal breath sounds.  Abdominal:     Palpations: Abdomen is soft.     Tenderness: There is no abdominal tenderness. There is right CVA tenderness (Mild right mid flank discomfort). There is no left CVA tenderness, guarding or rebound.  Musculoskeletal:        General: Tenderness (Right mid flank discomfort) present.     Cervical back: Normal range of motion and neck supple. No rigidity or tenderness.     Right lower leg: No edema.     Left lower leg: No edema.     Right foot: Normal range of motion. No deformity.     Left foot: Normal range of motion. No deformity.  Feet:     Right foot:     Protective  Sensation: 10 sites tested. 10 sites sensed.     Skin integrity: Skin integrity normal.     Toenail Condition: Right toenails are normal.     Left foot:     Protective Sensation: 10 sites tested. 10 sites sensed.     Skin integrity: Skin integrity normal.     Toenail Condition: Left toenails are normal.  Lymphadenopathy:     Cervical: No cervical adenopathy.  Skin:    General: Skin is warm and dry.  Neurological:     General: No focal deficit present.     Mental Status: He is alert and oriented to person, place, and time.  Psychiatric:        Mood and Affect: Mood normal.        Behavior: Behavior normal.     BP 115/80 (BP Location: Left Arm, Patient Position: Sitting)   Pulse 78   Wt 225 lb (102.1 kg)   SpO2 94%   BMI 33.23 kg/m  Wt Readings from Last 3 Encounters:  07/16/20 225 lb (102.1 kg)  11/01/19 236 lb (107 kg)  07/23/19 240 lb (108.9 kg)     Health Maintenance Due  Topic Date Due  . Hepatitis C Screening  Never done  . PNEUMOCOCCAL POLYSACCHARIDE VACCINE AGE 19-64 HIGH RISK  Never done  . COVID-19 Vaccine (1) Never done  . TETANUS/TDAP  Never done  . OPHTHALMOLOGY EXAM  12/12/2015  . URINE MICROALBUMIN  06/10/2019  . INFLUENZA VACCINE  04/06/2020  . HEMOGLOBIN A1C  04/30/2020  . FOOT EXAM  05/23/2020    Lab Results  Component Value Date   TSH 0.876  09/30/2014   Lab Results  Component Value Date   WBC 5.4 10/28/2019   HGB 14.1 10/28/2019   HCT 43.4 10/28/2019   MCV 93.7 10/28/2019   PLT 168 10/28/2019   Lab Results  Component Value Date   NA 139 10/28/2019   K 3.8 10/28/2019   CO2 23 10/28/2019   GLUCOSE 103 (H) 10/28/2019   BUN 12 10/28/2019   CREATININE 1.00 10/28/2019   BILITOT 0.7 10/28/2019   ALKPHOS 111 10/28/2019   AST 19 10/28/2019   ALT 23 10/28/2019   PROT 6.7 10/28/2019   ALBUMIN 3.8 10/28/2019   CALCIUM 8.9 10/28/2019   ANIONGAP 9 10/28/2019   Lab Results  Component Value Date   CHOL 183 05/24/2019   Lab Results   Component Value Date   HDL 32 (L) 05/24/2019   Lab Results  Component Value Date   LDLCALC 109 (H) 05/24/2019   Lab Results  Component Value Date   TRIG 241 (H) 05/24/2019   Lab Results  Component Value Date   CHOLHDL 5.7 (H) 05/24/2019   Lab Results  Component Value Date   HGBA1C 6.1 (A) 11/01/2019      Assessment & Plan:  1. Type 2 diabetes mellitus with hyperglycemia, without long-term current use of insulin ([place]) Patient reports that he has had recent increase in hunger, thirst and increased frequency of urination.  He has not been taking his Metformin on a daily basis due to GI upset associated with the use of the medication.  [name] fasting blood sugar at today's visit is 367 and hemoglobin A1c is elevated at 12.4.  Patient was given 12 units of regular insulin at today's visit after having blood work to check insulin and C-peptide level.  Discussed insulin resistance and basics of diabetes with the patient.  Patient does not wish to take insulin as he states that he is afraid of needles.  We will have patient try Amaryl to help reduce blood sugars as well as Metformin XR which hopefully will be better tolerated by the patient.  He has been asked to start monitoring his blood sugars and bring blood sugar diary and glucometer back to clinic in 1 to 2 weeks to meet with the clinical pharmacist to see if patient's blood sugars are improving on Amaryl and Metformin.  Patient will also be notified if it appears that he is not making adequate insulin based on his insulin and C-peptide levels.  He will additionally have urinalysis to look for ketones and glucosuria, basic metabolic panel and lipid panel in follow-up of his diabetes.  He is provided with refill of diabetic testing supplies.  Educational material on hyperglycemia and diabetes action plan provided as part of after visit summary. - POCT glucose (manual entry) - POCT glycosylated hemoglobin (Hb A1C) - POCT URINALYSIS DIP  (CLINITEK) - Basic Metabolic Panel - Lipid panel - Insulin and C-Peptide - insulin regular (NOVOLIN R) 100 units/mL injection 12 Units - metFORMIN (GLUCOPHAGE XR) 500 MG 24 hr tablet; Start with one pill daily after the evening meal and then increase to two pills after one week  Dispense: 60 tablet; Refill: 4 - glimepiride (AMARYL) 4 MG tablet; One pill before the first meal of the day to lower blood sugar  Dispense: 30 tablet; Refill: 4 - TRUEplus Lancets 28G MISC; 1 each by Does not apply route 3 (three) times daily.  Dispense: 100 each; Refill: 11 - glucose blood (TRUE METRIX BLOOD GLUCOSE TEST) test strip; 1 each by Other  route 3 (three) times daily.  Dispense: 100 each; Refill: 11 - POCT glucose (manual entry)  2. Hyperlipidemia, unspecified hyperlipidemia type Patient has had past hyperlipidemia for which he has been on simvastatin.  As he is fasting at today's visit, he will have repeat lipid panel and will be notified of the results and statin therapy recommendations based on those results.  3. Hematuria, unspecified type On urinalysis at today's visit, patient with hematuria.  Urine will be sent for culture and he will be notified if further treatment is needed based on the culture results.  Patient has had hematuria in the past and he reports negative work-up by urology.  Will obtain repeat urinalysis at his next visit and if he continues to have hematuria, he will again be referred to urology for further evaluation. - Urine Culture  -Greater than 40 minutes was spent at today's visit on direct patient care including obtaining history, review of systems, physical examination, placement of orders and discussion of assessment and treatment plan with the patient at today's visit.  Follow-up: Return in about 6 weeks (around 08/27/2020) for DM: 2-3 weeks with Luke/CPP-bring your glucometer.    Antony Blackbird, MD

## 2020-07-17 ENCOUNTER — Other Ambulatory Visit: Payer: Self-pay | Admitting: Family Medicine

## 2020-07-17 DIAGNOSIS — E782 Mixed hyperlipidemia: Secondary | ICD-10-CM

## 2020-07-17 LAB — BASIC METABOLIC PANEL WITH GFR
BUN/Creatinine Ratio: 11 (ref 9–20)
BUN: 11 mg/dL (ref 6–24)
CO2: 22 mmol/L (ref 20–29)
Calcium: 9.4 mg/dL (ref 8.7–10.2)
Chloride: 99 mmol/L (ref 96–106)
Creatinine, Ser: 1.04 mg/dL (ref 0.76–1.27)
GFR calc Af Amer: 95 mL/min/1.73
GFR calc non Af Amer: 82 mL/min/1.73
Glucose: 364 mg/dL — ABNORMAL HIGH (ref 65–99)
Potassium: 4 mmol/L (ref 3.5–5.2)
Sodium: 135 mmol/L (ref 134–144)

## 2020-07-17 LAB — LIPID PANEL
Chol/HDL Ratio: 6.9 ratio — ABNORMAL HIGH (ref 0.0–5.0)
Cholesterol, Total: 236 mg/dL — ABNORMAL HIGH (ref 100–199)
HDL: 34 mg/dL — ABNORMAL LOW
LDL Chol Calc (NIH): 144 mg/dL — ABNORMAL HIGH (ref 0–99)
Triglycerides: 314 mg/dL — ABNORMAL HIGH (ref 0–149)
VLDL Cholesterol Cal: 58 mg/dL — ABNORMAL HIGH (ref 5–40)

## 2020-07-17 LAB — INSULIN AND C-PEPTIDE, SERUM
C-Peptide: 2.1 ng/mL (ref 1.1–4.4)
INSULIN: 7.4 u[IU]/mL (ref 2.6–24.9)

## 2020-07-17 MED ORDER — ROSUVASTATIN CALCIUM 20 MG PO TABS: 20.0000 mg | ORAL_TABLET | Freq: Every day | ORAL | 11 refills | Status: DC

## 2020-07-18 LAB — URINE CULTURE: Organism ID, Bacteria: NO GROWTH

## 2020-07-18 MED FILL — ROSUVASTATIN CALCIUM 20 MG: 20 | 30 days supply | Qty: 30 | Fill #0

## 2020-07-29 NOTE — Progress Notes (Signed)
S:     PCP: Dr. Chapman Fitch PMH: T2DM, BPH, HLD  Patient arrives in good spirits. Presents for diabetes evaluation, education, and management  Patient was referred and established care with Primary Care Provider on 07/16/20. At that visit, patient reported being out of medications except metformin which he reported taking intermittently due to GI side-effects. Reported not checking BG and eating more junk food. Clinic BG was 367 with ketonuria present. Pt received 12 units of Novolog x1 in clinic. PCP initially wanted to start insulin, however, patient expressed fear of needles, therefore glimepiride 4 mg daily was initiated. Additionally, simvastatin was discontinued and rosuvastatin 20 mg daily was started for lipid management/primary prevention of ASCVD.  Patient reports diabetes was diagnosed with diabetes for ~4-5 years now.   Today, patient reports he has not picked up glimepiride, metformin XR, rosuvastatin, and glucometer materials from pharmacy because he lost his job and cannot afford ~$30 copay cost. Reports taking metformin IR 1 tablet every two days due to diarrhea. Reports not checking BG at home because he is "scared" and knows it's high. Denies symtomatic hypoglycemia. Reports increased hunger, but trying to eat more healthier by limiting fried food and walking more.   Insurance coverage/medication affordability: self-pay  Family/Social History: -Fhx: HTN in father, mother, grandmother, grandfather; heart attack in grandmother -Tobacco use: former smoker (quit 2015; 0.33 ppd x 32 years)  Medication adherence reported denied.   Current diabetes medications include: glimepiride 4 mg daily (not taking), metformin XR 500 mg BID (not taking) Current hypertension medications include: none Current hyperlipidemia medications include: rosuvastatin 20 mg daily (not taking)  Patient denies hypoglycemic events.  Patient reported dietary habits: Breakfast: cornflakes  cereal Lunch/dinner: mac and cheese, bologna, fried chicken, hot dogs Snacks: chocolate chips, chocolate candy, fruits Drinks: water, lemonade, limiting soda  Patient-reported exercise habits: walking 2 miles twice daily   Patient reports nocturia (nighttime urination) (4x/night) Patient reports neuropathy (nerve pain). Patient reports visual changes. Patient reports self foot exams.     O:  POCT: 353 (post-prandial)  Home fasting blood sugars: not checking 2 hour post-meal/random blood sugars: not checking  Lab Results  Component Value Date   HGBA1C 12.4 (A) 07/16/2020   There were no vitals filed for this visit.  Lipid Panel     Component Value Date/Time   CHOL 236 (H) 07/16/2020 1041   TRIG 314 (H) 07/16/2020 1041   HDL 34 (L) 07/16/2020 1041   CHOLHDL 6.9 (H) 07/16/2020 1041   LDLCALC 144 (H) 07/16/2020 1041    Clinical Atherosclerotic Cardiovascular Disease (ASCVD): No  The 10-year ASCVD risk score Mikey Bussing DC Jr., et al., 2013) is: 11.1%   Values used to calculate the score:     Age: 5 years     Sex: Male     Is Non-Hispanic African American: Yes     Diabetic: Yes     Tobacco smoker: No     Systolic Blood Pressure: 989 mmHg     Is BP treated: No     HDL Cholesterol: 34 mg/dL     Total Cholesterol: 236 mg/dL   A/P: Diabetes longstanding currently uncontrolled. Control is suboptimal due to inability to afford medications. [place] and copay cost of metformin XR and glimepiride is $14. Discussed the importance of these medications and the complications of uncontrolled diabetes. Patient verbalized understanding and will talk to family members to help with obtaining diabetes medications. Also, encouraged patient to make an appointment with [employer] risk analyst)  to discuss financial assistance options.  -Continued glimepiride 4 mg daily -Continued metformin XR 500 mg twice daily -Extensively discussed pathophysiology of diabetes, recommended lifestyle  interventions, dietary effects on blood sugar control -Counseled on s/sx of and management of hypoglycemia -Next A1C anticipated February 2022.   ASCVD risk - primary prevention in patient with diabetes. Last LDL is not controlled. ASCVD risk score is not >20%  - moderate or high intensity statin recommended. Patient was recently switched to rosuvastatin however cannot afford copay cost. Will revisit starting rosuvastatin once patient has picked up diabetes medications. Aspirin is not indicated.  -Continued rosuvstatin 20 mg.   Written patient instructions provided.  Total time in face to face counseling 30 minutes.   Follow up Pharmacist Clinic Visit in 2 weeks.    Lorel Monaco, PharmD PGY2 Ambulatory Care Resident [place]

## 2020-07-30 ENCOUNTER — Ambulatory Visit: Payer: Self-pay | Attending: Family Medicine | Admitting: Pharmacist

## 2020-07-30 ENCOUNTER — Other Ambulatory Visit: Payer: Self-pay

## 2020-07-30 DIAGNOSIS — E119 Type 2 diabetes mellitus without complications: Secondary | ICD-10-CM

## 2020-07-30 LAB — GLUCOSE, POCT (MANUAL RESULT ENTRY): POC Glucose: 353 mg/dl — AB (ref 70–99)

## 2020-08-11 MED FILL — GLIMEPIRIDE 4 MG TABS: 4 | 30 days supply | Qty: 30 | Fill #0

## 2020-08-11 MED FILL — ROSUVASTATIN CALCIUM 20 MG: 20 | 30 days supply | Qty: 30 | Fill #0

## 2020-08-11 MED FILL — metFORMIN HCL ER 500 MG TB2: 500 | 30 days supply | Qty: 60 | Fill #0

## 2020-08-14 ENCOUNTER — Ambulatory Visit: Payer: Self-pay | Attending: Family Medicine | Admitting: Pharmacist

## 2020-08-14 ENCOUNTER — Other Ambulatory Visit: Payer: Self-pay

## 2020-08-14 ENCOUNTER — Encounter: Payer: Self-pay | Admitting: Pharmacist

## 2020-08-14 DIAGNOSIS — E1165 Type 2 diabetes mellitus with hyperglycemia: Secondary | ICD-10-CM

## 2020-08-14 DIAGNOSIS — E119 Type 2 diabetes mellitus without complications: Secondary | ICD-10-CM

## 2020-08-14 LAB — GLUCOSE, POCT (MANUAL RESULT ENTRY): POC Glucose: 133 mg/dl — AB (ref 70–99)

## 2020-08-14 MED ORDER — METFORMIN HCL ER 500 MG PO TB24: 500.0000 mg | ORAL_TABLET | Freq: Every day | ORAL | 2 refills | Status: DC

## 2020-08-14 NOTE — Progress Notes (Signed)
    S:    PCP: Dr. Chapman Fitch PMH: T2DM, BPH, HLD  Patient arrives in good spirits. Presents for diabetes evaluation, education, and management. Patient was referred on 07/16/20. Pharmacy saw him on 07/30/2020.   Since that visit, pt has been able to pick up his medications. He continues to experience diarrhea with metformin use but is taking XR in the morning before breakfast. He is compliant with glimepiride. He reports resolution in polyuria, polydipsia and polyphagia. He denies hypoglycemia. He reports making drastic changes in his diet and exercise.   Insurance coverage/medication affordability: self-pay  Family/Social History: -Fhx: HTN in father, mother, grandmother, grandfather; heart attack in grandmother -Tobacco use: former smoker (quit 2015; 0.33 ppd x 32 years)  Medication adherence reported.   Current diabetes medications include: glimepiride 4 mg daily, metformin XR 500 mg BID (only taking 1 tablet in the morning)   Current hypertension medications include: none Current hyperlipidemia medications include: rosuvastatin 20 mg daily  Patient denies hypoglycemic events.  Patient reported dietary habits: Breakfast: oatmeal and fruit Lunch/dinner: baked chicken and vegetables  Snacks: limiting sweets and desserts  Drinks: water, lemonade, limiting soda  Patient-reported exercise habits: walking 2 miles twice daily   Patient denies nocturia (nighttime urination) Patient denies neuropathy (nerve pain). Patient denies visual changes. Patient reports self foot exams.     O:  POCT: 133 (post-prandial)  Home fasting blood sugars: not checking 2 hour post-meal/random blood sugars: not checking  Lab Results  Component Value Date   HGBA1C 12.4 (A) 07/16/2020   There were no vitals filed for this visit.  Lipid Panel     Component Value Date/Time   CHOL 236 (H) 07/16/2020 1041   TRIG 314 (H) 07/16/2020 1041   HDL 34 (L) 07/16/2020 1041   CHOLHDL 6.9 (H) 07/16/2020 1041    LDLCALC 144 (H) 07/16/2020 1041    Clinical Atherosclerotic Cardiovascular Disease (ASCVD): No  The 10-year ASCVD risk score Mikey Bussing DC Jr., et al., 2013) is: 11.1%   Values used to calculate the score:     Age: 54 years     Sex: Male     Is Non-Hispanic African American: Yes     Diabetic: Yes     Tobacco smoker: No     Systolic Blood Pressure: 448 mmHg     Is BP treated: No     HDL Cholesterol: 34 mg/dL     Total Cholesterol: 236 mg/dL   A/P: Diabetes longstanding currently uncontrolled but he looks to be improving. Patient verbalizes appropriate hypoglycemia management plan. Pt is adherent to glimepiride but takes metformin only in the morning d/t diarrhea. He is still not checking blood sugars at home.  -Continued glimepiride 4 mg daily -Continued metformin XR 500 mg. Adjust to once daily in the evening after dinner.  -Extensively discussed pathophysiology of diabetes, recommended lifestyle interventions, dietary effects on blood sugar control -Counseled on s/sx of and management of hypoglycemia -Next A1C anticipated February 2022.   ASCVD risk - primary prevention in patient with diabetes. Last LDL is not controlled. ASCVD risk score is not >20%  - moderate or high intensity statin recommended. Patient was able to pick up and begin rosuvastatin.  -Continued rosuvstatin 20 mg.   Written patient instructions provided.  Total time in face to face counseling 30 minutes.   Follow up Pharmacist Clinic Visit in 1 month.    Benard Halsted, PharmD, [place], [place] [phone]

## 2020-08-24 NOTE — Progress Notes (Deleted)
Subjective:    Patient ID: Dustin Hale, male    DOB: 06-Mar-1966, 54 y.o.   MRN: [id number]  54 y.o.M here to est pcp former [facility] patient.   T2DM BPH, smoler, DJD L shoulder  PT saw Lurena Joiner  08/15/20:  PMH: T2DM, BPH, HLD  Patient arrives in good spirits. Presents for diabetes evaluation, education, and management. Patient was referred on 07/16/20. Pharmacy saw him on 07/30/2020.   Since that visit, pt has been able to pick up his medications. He continues to experience diarrhea with metformin use but is taking XR in the morning before breakfast. He is compliant with glimepiride. He reports resolution in polyuria, polydipsia and polyphagia. He denies hypoglycemia. He reports making drastic changes in his diet and exercise.   Insurance coverage/medication affordability: self-pay  Family/Social History: -Fhx: HTN in father, mother, grandmother, grandfather; heart attack in grandmother -Tobacco use: former smoker (quit 2015; 0.33 ppd x 32 years)  Medication adherence reported.   Current diabetes medications include: glimepiride 4 mg daily, metformin XR 500 mg BID (only taking 1 tablet in the morning)   Current hypertension medications include: none Current hyperlipidemia medications include: rosuvastatin 20 mg daily  Patient denies hypoglycemic events.  Patient reported dietary habits: Breakfast: oatmeal and fruit Lunch/dinner: baked chicken and vegetables  Snacks: limiting sweets and desserts  Drinks: water, lemonade, limiting soda  Patient-reported exercise habits: walking 2 miles twice daily   Patient denies nocturia (nighttime urination) Patient denies neuropathy (nerve pain). Patient denies visual changes. Patient reports self foot exams.     O:  POCT: 133 (post-prandial)  Home fasting blood sugars: not checking 2 hour post-meal/random blood sugars: not checking  Recent Labs Lab Results Component Value Date  HGBA1C 12.4 (A) 07/16/2020   There were no  vitals filed for this visit.  Lipid Panel  Labs (Brief)   Component Value Date/Time  CHOL 236 (H) 07/16/2020 1041  TRIG 314 (H) 07/16/2020 1041  HDL 34 (L) 07/16/2020 1041  CHOLHDL 6.9 (H) 07/16/2020 1041  LDLCALC 144 (H) 07/16/2020 1041    Clinical Atherosclerotic Cardiovascular Disease (ASCVD): No  The 10-year ASCVD risk score Mikey Bussing DC Jr., et al., 2013) is: 11.1%   Values used to calculate the score:     Age: 89 years     Sex: Male     Is Non-Hispanic African American: Yes     Diabetic: Yes     Tobacco smoker: No     Systolic Blood Pressure: 754 mmHg     Is BP treated: No     HDL Cholesterol: 34 mg/dL     Total Cholesterol: 236 mg/dL   A/P: Diabetes longstanding currently uncontrolled but he looks to be improving. Patient verbalizes appropriate hypoglycemia management plan. Pt is adherent to glimepiride but takes metformin only in the morning d/t diarrhea. He is still not checking blood sugars at home.  -Continued glimepiride 4 mg daily -Continued metformin XR 500 mg. Adjust to once daily in the evening after dinner.  -Extensively discussed pathophysiology of diabetes, recommended lifestyle interventions, dietary effects on blood sugar control -Counseled on s/sx of and management of hypoglycemia -Next A1C anticipated February 2022.   ASCVD risk - primary prevention in patient with diabetes. Last LDL is not controlled. ASCVD risk score is not >20%  - moderate or high intensity statin recommended. Patient was able to pick up and begin rosuvastatin.  -Continued rosuvstatin 20 mg.   Written patient instructions provided.  Total time in face to face counseling 30 minutes.  Follow up Pharmacist Clinic Visit in 1 month.         Review of Systems     Objective:   Physical Exam        Assessment & Plan:

## 2020-08-25 ENCOUNTER — Ambulatory Visit: Payer: Self-pay | Admitting: Critical Care Medicine

## 2020-08-26 IMAGING — CT CT ABDOMEN AND PELVIS WITHOUT AND WITH CONTRAST
3 of 12 series · 11 of 46 positions shown, 17 images · IV contrast (omnipaque)
Comparison: Noncontrast CT abdomen/pelvis of 04/30/2008.

CLINICAL DATA: Microscopic hematuria.

EXAM:
CT ABDOMEN AND PELVIS WITHOUT AND WITH CONTRAST
TECHNIQUE: Multidetector CT imaging of the abdomen and pelvis was performed
following the standard protocol before and following the bolus
administration of intravenous contrast.
CONTRAST:  100mL OMNIPAQUE IOHEXOL 300 MG/ML  SOLN

[Series 2: axial pre · axial · non-contrast · 0.87mm/px · z∈[-376,-120]mm · 4 of 87 slices shown]
[im 18/87  soft-tissue]
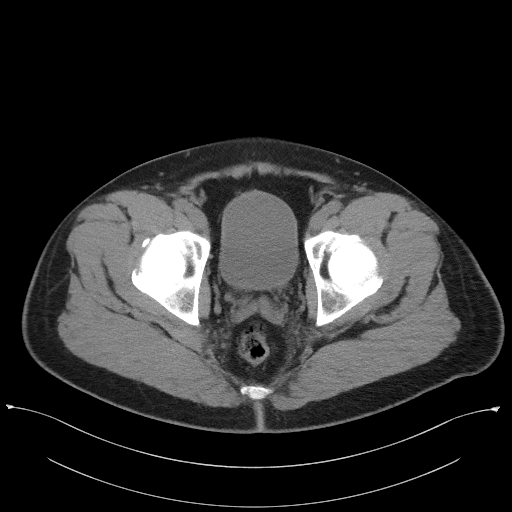
[im 35/87  soft-tissue]
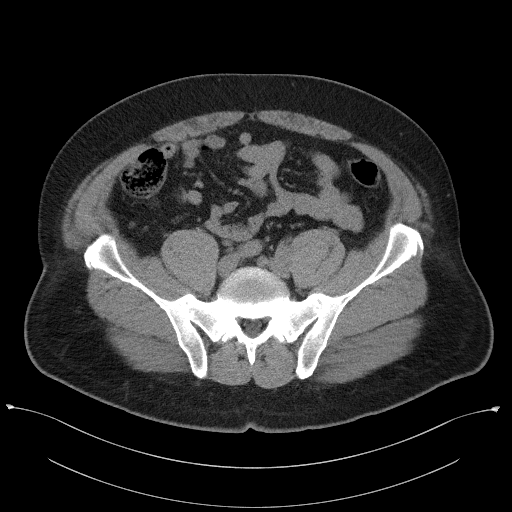
[im 52/87  soft-tissue]
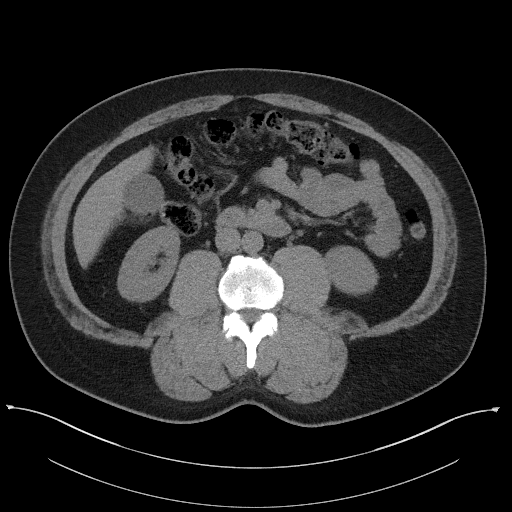
[im 69/87  soft-tissue]
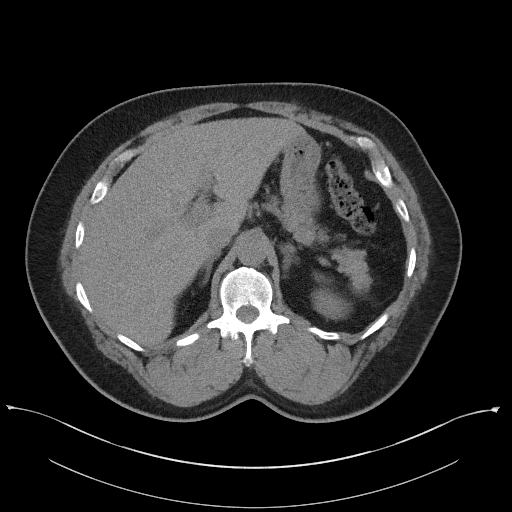

[Series 10: axial delay · axial · delayed · 0.88mm/px · z∈[-423,-78]mm · 5 of 105 slices shown, 10 images]
[im 18/105  soft-tissue]
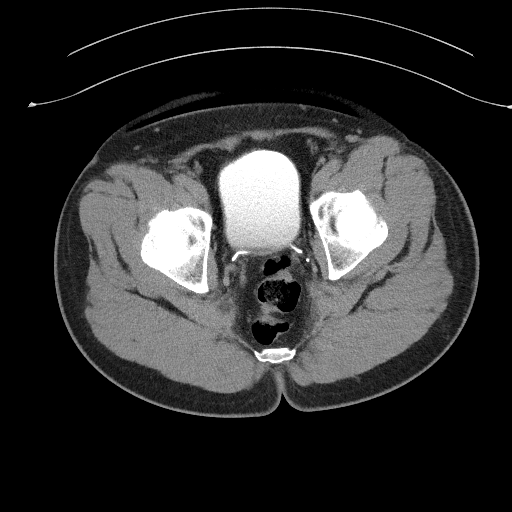
[im 18/105  bone]
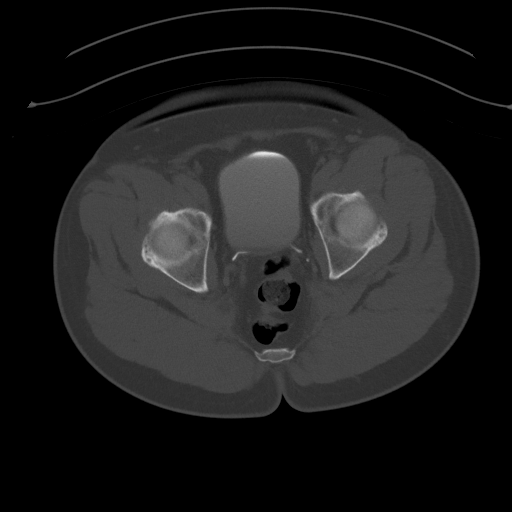
[im 35/105  soft-tissue]
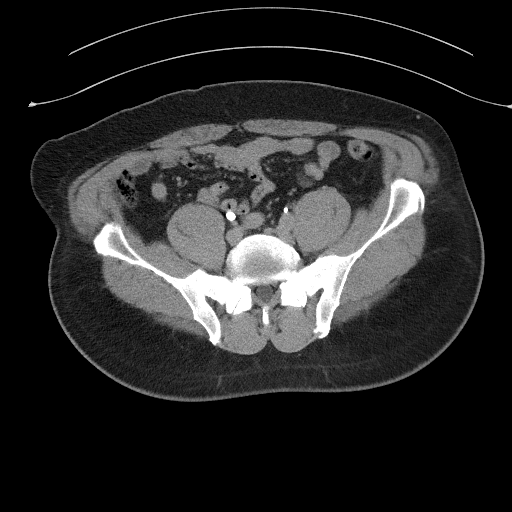
[im 35/105  lung]
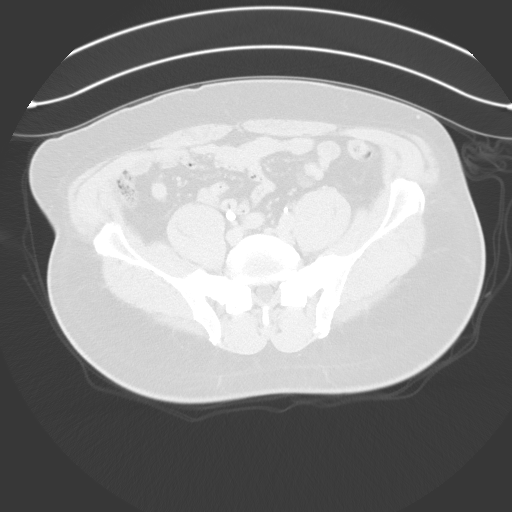
[im 53/105  soft-tissue]
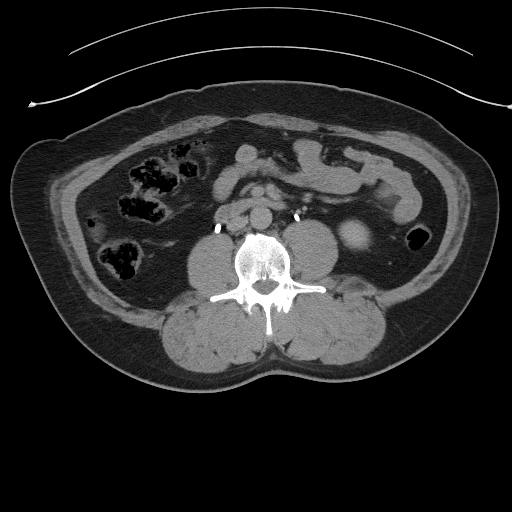
[im 53/105  lung]
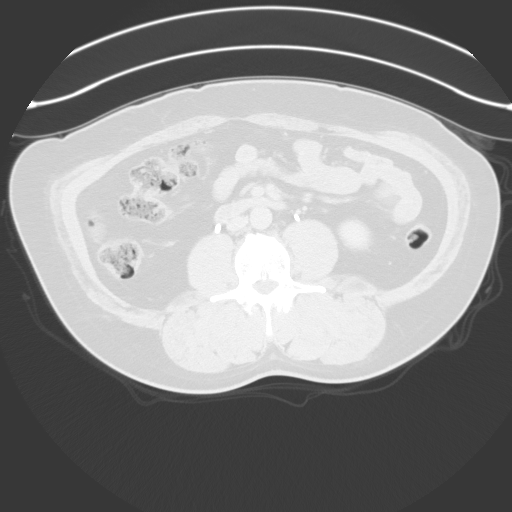
[im 70/105  soft-tissue]
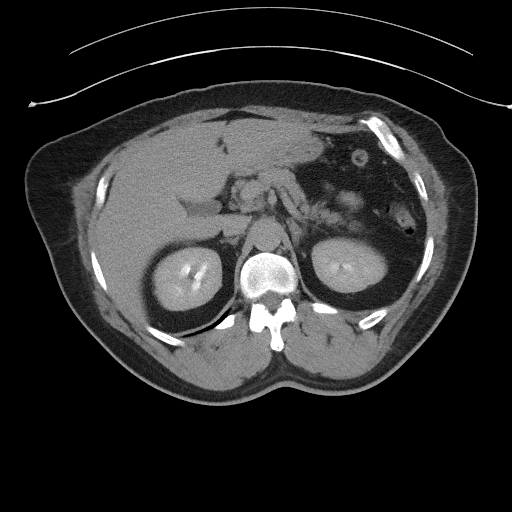
[im 70/105  lung]
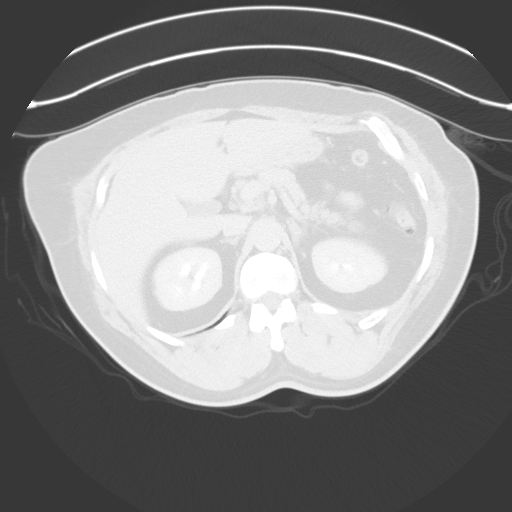
[im 87/105  soft-tissue]
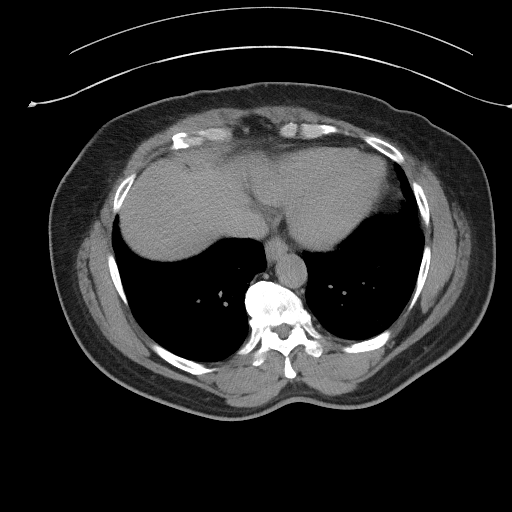
[im 87/105  lung]
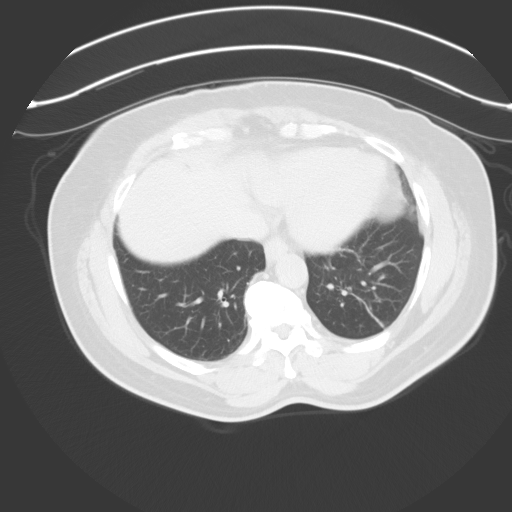

[Series 13: coronal pre · coronal · non-contrast · 0.77mm/px · 2 of 100 slices shown, 3 images]
[im 34/100  soft-tissue]
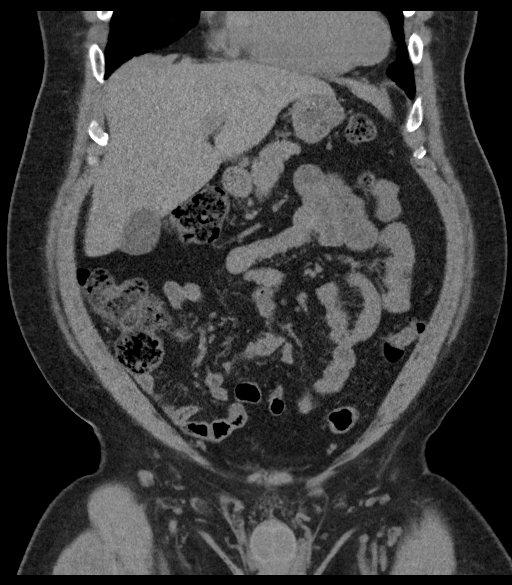
[im 34/100  bone]
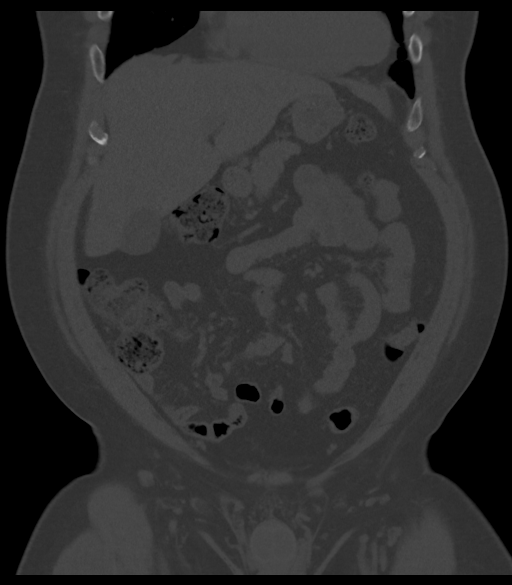
[im 67/100  soft-tissue]
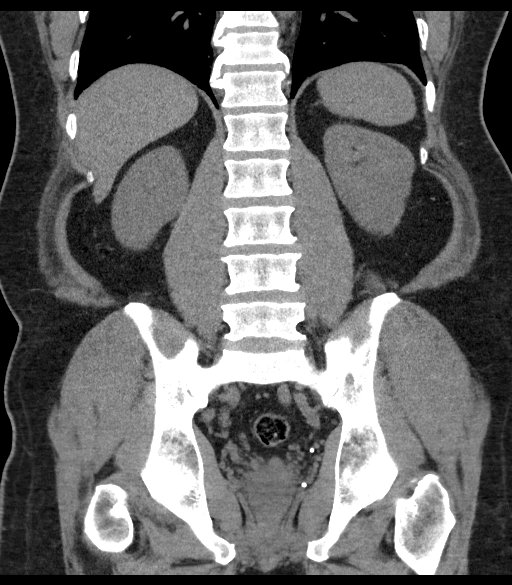

[11 of 46 positions shown; findings below may reference images not displayed]

FINDINGS: Lower chest: Subsegmental atelectasis or linear scar noted left
base.

Hepatobiliary: No suspicious focal abnormality within the liver
parenchyma. There is no evidence for gallstones, gallbladder wall
thickening, or pericholecystic fluid. No intrahepatic or
extrahepatic biliary dilation.

Pancreas: No focal mass lesion. No dilatation of the main duct. No
intraparenchymal cyst. No peripancreatic edema.

Spleen: No splenomegaly. No focal mass lesion.

Adrenals/Urinary Tract: No adrenal nodule or mass. No stones are
evident in either kidney or ureter. No bladder stones.

Imaging after IV contrast administration demonstrates no enhancing
or suspicious lesion in either kidney.

Delayed imaging shows no wall thickening or soft tissue filling
defect in either intrarenal collecting system or renal pelvis. Both
ureters are well opacified with normal CT imaging appearance. No
focal bladder wall abnormality evident.

Stomach/Bowel: Stomach is unremarkable. No gastric wall thickening.
No evidence of outlet obstruction. Duodenum is normally positioned
as is the ligament of Treitz. No small bowel wall thickening. No
small bowel dilatation. The terminal ileum is normal. The appendix
is normal. No gross colonic mass. No colonic wall thickening.

Vascular/Lymphatic: No abdominal aortic aneurysm. No abdominal
aortic atherosclerotic calcification. There is no gastrohepatic or
hepatoduodenal ligament lymphadenopathy. No intraperitoneal or
retroperitoneal lymphadenopathy. No pelvic sidewall lymphadenopathy.

Reproductive: The prostate gland and seminal vesicles are
unremarkable.

Other: No intraperitoneal free fluid.

Musculoskeletal: No worrisome lytic or sclerotic osseous
abnormality.
IMPRESSION: 1. No CT findings to explain the patient's history of hematuria.
2. No acute findings in the abdomen or pelvis.

## 2020-08-27 ENCOUNTER — Ambulatory Visit: Payer: Self-pay | Admitting: Family Medicine

## 2020-09-16 ENCOUNTER — Ambulatory Visit: Payer: Self-pay | Admitting: Pharmacist

## 2020-09-22 ENCOUNTER — Encounter (HOSPITAL_COMMUNITY): Payer: Self-pay | Admitting: *Deleted

## 2020-09-22 ENCOUNTER — Other Ambulatory Visit: Payer: Self-pay

## 2020-09-22 ENCOUNTER — Emergency Department (HOSPITAL_COMMUNITY)
Admission: EM | Admit: 2020-09-22 | Discharge: 2020-09-23 | Disposition: A | Discharge status: Home / Self Care | Payer: HRSA Program | Attending: Emergency Medicine | Admitting: Emergency Medicine

## 2020-09-22 DIAGNOSIS — Z87891 Personal history of nicotine dependence: Secondary | ICD-10-CM | POA: Insufficient documentation

## 2020-09-22 DIAGNOSIS — U071 COVID-19: Secondary | ICD-10-CM | POA: Diagnosis not present

## 2020-09-22 DIAGNOSIS — E119 Type 2 diabetes mellitus without complications: Secondary | ICD-10-CM | POA: Insufficient documentation

## 2020-09-22 DIAGNOSIS — R509 Fever, unspecified: Secondary | ICD-10-CM | POA: Diagnosis present

## 2020-09-22 DIAGNOSIS — Z7984 Long term (current) use of oral hypoglycemic drugs: Secondary | ICD-10-CM | POA: Insufficient documentation

## 2020-09-22 LAB — CBC
HCT: 47.1 % (ref 39.0–52.0)
Hemoglobin: 16.1 g/dL (ref 13.0–17.0)
MCH: 30.6 pg (ref 26.0–34.0)
MCHC: 34.2 g/dL (ref 30.0–36.0)
MCV: 89.4 fL (ref 80.0–100.0)
Platelets: 123 10*3/uL — ABNORMAL LOW (ref 150–400)
RBC: 5.27 MIL/uL (ref 4.22–5.81)
RDW: 12.2 % (ref 11.5–15.5)
WBC: 4.5 10*3/uL (ref 4.0–10.5)
nRBC: 0 % (ref 0.0–0.2)

## 2020-09-22 LAB — COMPREHENSIVE METABOLIC PANEL
ALT: 30 U/L (ref 0–44)
AST: 30 U/L (ref 15–41)
Albumin: 3.5 g/dL (ref 3.5–5.0)
Alkaline Phosphatase: 65 U/L (ref 38–126)
Anion gap: 13 (ref 5–15)
BUN: 11 mg/dL (ref 6–20)
CO2: 20 mmol/L — ABNORMAL LOW (ref 22–32)
Calcium: 8.8 mg/dL — ABNORMAL LOW (ref 8.9–10.3)
Chloride: 102 mmol/L (ref 98–111)
Creatinine, Ser: 1.11 mg/dL (ref 0.61–1.24)
GFR, Estimated: >60 mL/min (ref >60)
Glucose, Bld: 117 mg/dL — ABNORMAL HIGH (ref 70–99)
Potassium: 3.3 mmol/L — ABNORMAL LOW (ref 3.5–5.1)
Sodium: 135 mmol/L (ref 135–145)
Total Bilirubin: 0.7 mg/dL (ref 0.3–1.2)
Total Protein: 7.3 g/dL (ref 6.5–8.1)

## 2020-09-22 LAB — RESP PANEL BY RT-PCR (FLU A&B, COVID) ARPGX2
Influenza A by PCR: NEGATIVE
Influenza B by PCR: NEGATIVE
SARS Coronavirus 2 by RT PCR: POSITIVE — AB

## 2020-09-22 LAB — LIPASE, BLOOD: Lipase: 26 U/L (ref 11–51)

## 2020-09-22 NOTE — ED Notes (Signed)
Wrong pt

## 2020-09-22 NOTE — ED Triage Notes (Signed)
Pt reports having bodyaches and fever/chills x 1 week. Pt states he has the flu. Denies headache, sore throat, cough.

## 2020-09-23 ENCOUNTER — Encounter (HOSPITAL_COMMUNITY): Payer: Self-pay | Admitting: Student

## 2020-09-23 MED ORDER — ACETAMINOPHEN 325 MG PO TABS
650.0000 mg | ORAL_TABLET | Freq: Once | ORAL | Status: AC
  Administered 2020-09-23: 650 mg via ORAL
  Filled 2020-09-23: qty 2

## 2020-09-23 MED ORDER — POTASSIUM CHLORIDE CRYS ER 20 MEQ PO TBCR: 20.0000 meq | EXTENDED_RELEASE_TABLET | Freq: Every day | ORAL | 0 refills | Status: DC

## 2020-09-23 MED ORDER — POTASSIUM CHLORIDE CRYS ER 20 MEQ PO TBCR
40.0000 meq | EXTENDED_RELEASE_TABLET | Freq: Once | ORAL | Status: AC
  Administered 2020-09-23: 40 meq via ORAL
  Filled 2020-09-23: qty 2

## 2020-09-23 MED ORDER — BENZONATATE 100 MG PO CAPS: 100.0000 mg | ORAL_CAPSULE | Freq: Three times a day (TID) | ORAL | 0 refills | Status: DC

## 2020-09-23 MED ORDER — ONDANSETRON 4 MG PO TBDP
4.0000 mg | ORAL_TABLET | Freq: Once | ORAL | Status: AC
  Administered 2020-09-23: 4 mg via ORAL
  Filled 2020-09-23: qty 1

## 2020-09-23 MED ORDER — DICYCLOMINE HCL 20 MG PO TABS: 20.0000 mg | ORAL_TABLET | Freq: Three times a day (TID) | ORAL | 0 refills | Status: DC | PRN

## 2020-09-23 MED ORDER — NAPROXEN 250 MG PO TABS: 500.0000 mg | ORAL_TABLET | Freq: Once | ORAL | Status: DC

## 2020-09-23 MED ORDER — ONDANSETRON 4 MG PO TBDP: 4.0000 mg | ORAL_TABLET | Freq: Three times a day (TID) | ORAL | 0 refills | Status: DC | PRN

## 2020-09-23 NOTE — ED Provider Notes (Signed)
[facility] EMERGENCY DEPARTMENT Provider Note   CSN: [id number] Arrival date & time: 09/22/20  1304     History Chief Complaint  Patient presents with  . Fever    Dustin Hale is a 55 y.o. male with a history of diabetes mellitus and hyperlipidemia who presents to the emergency department with general malaise over the past 1 week.  Patient reports that he generally does not feel well, he states he is having generalized body aches, subjective fevers, chills, fatigue, mild nasal congestion, mild intermittent headaches (gradual onset, similar to prior), cough, nausea, abdominal discomfort, and poor appetite.  No alleviating or aggravating factors to his symptoms.  His significant other is sick with similar symptoms.  He denies chest pain, dyspnea, hemoptysis, unilateral leg pain/swelling, recent surgeries/hospitalizations, prior VTE, or prior history of cancer.  HPI     Past Medical History:  Diagnosis Date  . Diabetes mellitus without complication ([place])   . Hyperlipidemia   . Obesity   . Poor dental hygiene     Patient Active Problem List   Diagnosis Date Noted  . BPH (benign prostatic hyperplasia) 10/25/2018  . Hyperlipidemia 10/16/2018  . Right lateral epicondylitis 10/01/2016  . Mental health problem 10/01/2016  . Degenerative joint disease, shoulder, left 10/27/2015  . Farsightedness 06/20/2015  . Type 2 diabetes mellitus without complication, without long-term current use of insulin ([place]) 06/20/2015  . Radicular pain in left arm 06/20/2015  . Current smoker 06/20/2015    Past Surgical History:  Procedure Laterality Date  . NO PAST SURGERIES         Family History  Problem Relation Age of Onset  . Hypertension Mother   . Other Mother        fire  . Hypertension Father   . Other Father        car crash  . Hypertension Maternal Grandmother   . Heart attack Maternal Grandmother   . Arthritis Maternal Grandmother   . Hypertension Maternal  Grandfather   . Hypertension Paternal Grandmother   . Stroke Other   . Colon cancer Neg Hx   . Rectal cancer Neg Hx   . Stomach cancer Neg Hx     Social History   Tobacco Use  . Smoking status: Former Smoker    Packs/day: 0.33    Years: 32.00    Pack years: 10.56    Types: Cigarettes  . Smokeless tobacco: Never Used  . Tobacco comment: quit smoking on 08/27/14  Vaping Use  . Vaping Use: Never used  Substance Use Topics  . Alcohol use: Yes    Alcohol/week: 0.0 standard drinks    Comment: occasional   . Drug use: Not Currently    Types: Marijuana    Comment: Last use August 2019    Home Medications Prior to Admission medications   Medication Sig Start Date End Date Taking? Authorizing Provider  bisacodyl (BISACODYL) 5 MG EC tablet Take 5 mg by mouth once. Dulcolax 5 mg tab take as directed for colonoscopy prep. Patient not taking: Reported on 07/16/2020    [provider]  blood glucose meter kit and supplies KIT Dispense based on patient and insurance preference. Use up to four times daily as directed. (FOR ICD-9 250.00, 250.01). 09/22/14   Emokpae, Ejiroghene E, MD  Blood Glucose Monitoring Suppl (TRUE METRIX METER) W/DEVICE KIT 1 each by Does not apply route as needed. 06/20/15   Funches, Adriana Mccallum, MD  cyclobenzaprine (FLEXERIL) 5 MG tablet One tablet every 6-8  hours as needed for muscle spasm and 2 at bedtime 11/01/19   Fulp, Cammie, MD  glimepiride (AMARYL) 4 MG tablet One pill before the first meal of the day to lower blood sugar 07/16/20   Fulp, Cammie, MD  glucose blood (TRUE METRIX BLOOD GLUCOSE TEST) test strip 1 each by Other route 3 (three) times daily. 07/16/20   Fulp, Cammie, MD  ibuprofen (ADVIL) 600 MG tablet Take 1 tablet (600 mg total) by mouth every 8 (eight) hours as needed for moderate pain. Eat before taking medication 11/01/19   Fulp, Cammie, MD  lidocaine (LIDODERM) 5 % Place 1 patch onto the skin daily. Remove & Discard patch within 12 hours or as  directed by MD 10/28/19   Henderly, Britni A, PA-C  metFORMIN (GLUCOPHAGE XR) 500 MG 24 hr tablet Take 1 tablet (500 mg total) by mouth daily after supper. Start with one pill daily after the evening meal and then increase to two pills after one week 08/14/20   Charlott Rakes, MD  rosuvastatin (CRESTOR) 20 MG tablet Take 1 tablet (20 mg total) by mouth daily. To lower lipids/cholesterol 07/17/20   Fulp, Cammie, MD  tamsulosin (FLOMAX) 0.4 MG CAPS capsule Take 1 capsule (0.4 mg total) by mouth daily. 07/23/19   Billey Co, MD  terbinafine (LAMISIL AT) 1 % cream Apply 1 application topically 2 (two) times daily. X 10 days and then as needed 08/22/19   Fulp, Cammie, MD  TRUEplus Lancets 28G MISC 1 each by Does not apply route 3 (three) times daily. 07/16/20   Antony Blackbird, MD    Allergies    Patient has no known allergies.  Review of Systems   Review of Systems  Constitutional: Positive for chills and fever.  HENT: Positive for congestion. Negative for ear pain and sore throat.   Eyes: Negative for visual disturbance.  Respiratory: Positive for cough. Negative for shortness of breath.   Cardiovascular: Negative for chest pain and leg swelling.  Gastrointestinal: Positive for abdominal pain and nausea. Negative for blood in stool, constipation, diarrhea and vomiting.  Neurological: Positive for headaches. Negative for dizziness, seizures, syncope, facial asymmetry, weakness (Focal) and numbness.  All other systems reviewed and are negative.   Physical Exam Updated Vital Signs BP 95/60   Pulse 84   Temp 98.1 F (36.7 C) (Oral)   Resp 16   SpO2 96%   Physical Exam Vitals and nursing note reviewed.  Constitutional:      General: He is not in acute distress.    Appearance: He is well-developed. He is not toxic-appearing.  HENT:     Head: Normocephalic and atraumatic.     Right Ear: Ear canal normal. Tympanic membrane is not perforated, erythematous, retracted or bulging.     Left  Ear: Ear canal normal. Tympanic membrane is not perforated, erythematous, retracted or bulging.     Ears:     Comments: No mastoid erythema/swellng/tenderness.     Nose:     Right Sinus: No maxillary sinus tenderness or frontal sinus tenderness.     Left Sinus: No maxillary sinus tenderness or frontal sinus tenderness.     Mouth/Throat:     Pharynx: Oropharynx is clear. Uvula midline. No oropharyngeal exudate or posterior oropharyngeal erythema.     Comments: Posterior oropharynx is symmetric appearing. Patient tolerating own secretions without difficulty. No trismus. No drooling. No hot potato voice. No swelling beneath the tongue, submandibular compartment is soft.  Eyes:     General:  Right eye: No discharge.        Left eye: No discharge.     Conjunctiva/sclera: Conjunctivae normal.  Cardiovascular:     Rate and Rhythm: Normal rate and regular rhythm.  Pulmonary:     Effort: Pulmonary effort is normal. No respiratory distress.     Breath sounds: Normal breath sounds. No wheezing, rhonchi or rales.     Comments: SPO2 100% on room air at rest.  I personally and patient ambulated throughout exam room, SPO2 maintained greater than 95% without signs of respiratory distress. Abdominal:     General: There is no distension.     Palpations: Abdomen is soft.     Tenderness: There is no abdominal tenderness. There is no guarding or rebound.  Musculoskeletal:     Cervical back: Neck supple. No rigidity.  Lymphadenopathy:     Cervical: No cervical adenopathy.  Skin:    General: Skin is warm and dry.     Findings: No rash.  Neurological:     Mental Status: He is alert.     Comments: Alert.  Clear speech.  CN II to XII grossly intact.  Station grossly tact bilateral upper and lower extremities.  5-5 symmetric grip strength.  5 out of 5 strength with plantar dorsiflexion bilaterally.  The patient is ambulatory.  Psychiatric:        Behavior: Behavior normal.     ED Results /  Procedures / Treatments   Labs (all labs ordered are listed, but only abnormal results are displayed) Labs Reviewed  RESP PANEL BY RT-PCR (FLU A&B, COVID) ARPGX2 - Abnormal; Notable for the following components:      Result Value   SARS Coronavirus 2 by RT PCR POSITIVE (*)    All other components within normal limits  COMPREHENSIVE METABOLIC PANEL - Abnormal; Notable for the following components:   Potassium 3.3 (*)    CO2 20 (*)    Glucose, Bld 117 (*)    Calcium 8.8 (*)    All other components within normal limits  CBC - Abnormal; Notable for the following components:   Platelets 123 (*)    All other components within normal limits  LIPASE, BLOOD    EKG None  Radiology No results found.  Procedures Procedures (including critical care time)  Medications Ordered in ED Medications - No data to display  ED Course  I have reviewed the triage vital signs and the nursing notes.  Pertinent labs & imaging results that were available during my care of the patient were reviewed by me and considered in my medical decision making (see chart for details).  Dustin Hale was evaluated in Emergency Department on 09/23/2020 for the symptoms described in the history of present illness. He/she was evaluated in the context of the global COVID-19 pandemic, which necessitated consideration that the patient might be at risk for infection with the SARS-CoV-2 virus that causes COVID-19. Institutional protocols and algorithms that pertain to the evaluation of patients at risk for COVID-19 are in a state of rapid change based on information released by regulatory bodies including the CDC and federal and state organizations. These policies and algorithms were followed during the patient's care in the ED.    MDM Rules/Calculators/A&P                         Patient presents to the ED with complaints of malaise x 1 week.  Nontoxic, initial tachycardia normalized on my exam, some earlier  BPs mildly soft  however cuff he has with him is too large- proper cuff size placed and BP >209 systolic x 2, currently 470/96.   Additional history obtained:  Additional history obtained from chart review & nursing note review.   Lab Tests:  I reviewed and interpreted labs, which included:  CBC: Mild thrombocytopenia CMP: Mild hypokalemia. Lipase: Within normal limits COVID test: Positive  ED Course:  Exam is without signs of AOM, AOE, or mastoiditis. Oropharyngeal exam is benign. No sinus tenderness. No meningeal signs.  No headache red flags.  Lungs are CTA without focal adventitious sounds, no signs of increased work of breathing, doubt CAP.  No rashes.  Abdomen is without focal tenderness or peritoneal signs, have a low suspicion for acute surgical process.  Patient blood work does reveal some mild hypokalemia which will be orally replaced.  [name] COVID-19 test is positive which I suspect is the cause of his symptoms.  He is able to ambulate in the emergency department with SPO2 maintaining greater than 95% on room air without signs of respiratory distress.  He is tolerating p.o.  He overall appears appropriate for discharge home with supportive care and PCP follow-up I discussed results, treatment plan, need for follow-up, and return precautions with the patient. Provided opportunity for questions, patient confirmed understanding and is in agreement with plan.   Blood pressure 112/76, pulse 81, temperature 98.4 F (36.9 C), temperature source Oral, resp. rate 16, SpO2 97 %.  Portions of this note were generated with [employer]. Dictation errors may occur despite best attempts at proofreading.   Final Clinical Impression(s) / ED Diagnoses Final diagnoses:  COVID-19    Rx / DC Orders ED Discharge Orders         Ordered    potassium chloride SA (KLOR-CON) 20 MEQ tablet  Daily        09/23/20 0125    ondansetron (ZOFRAN ODT) 4 MG disintegrating tablet  Every 8 hours PRN         09/23/20 0125    dicyclomine (BENTYL) 20 MG tablet  Every 8 hours PRN        09/23/20 0125    benzonatate (TESSALON) 100 MG capsule  Every 8 hours        09/23/20 0126    Ambulatory referral for Covid Treatment        09/23/20 0146           Amaryllis Dyke, PA-C 09/23/20 0147    Maudie Flakes, MD 09/23/20 [phone]

## 2020-09-23 NOTE — ED Notes (Signed)
Patient verbalizes understanding of discharge instructions. Prescriptions and follow-up care reviewed. Opportunity for questioning and answers were provided. Armband removed by staff, pt discharged from ED ambulatory.  

## 2020-09-23 NOTE — Discharge Instructions (Addendum)
You were seen in the emergency department today and tested positive for COVID-19, we suspect this is the cause of your symptoms.  Your blood work showed that your potassium was low, we are sending you home with a few days of a supplement of this as well as diet recommendations.  Your platelets were also somewhat low.  Please have these rechecked by your primary care provider in 1 to 2 weeks. We are sending you home with the following medications to help with your symptoms:  -Zofran: Take every hours as needed for nausea and vomiting - Bentyl: Take every hours as needed for abdominal cramping/spasms - Tessalon: Take every hour as needed for coughing - Potassium tablet: Take daily for the next 3 days.  Please take Tylenol and/or ibuprofen for over-the-counter dosing to help with any headaches/fevers.  We have prescribed you new medication(s) today. Discuss the medications prescribed today with your pharmacist as they can have adverse effects and interactions with your other medicines including over the counter and prescribed medications. Seek medical evaluation if you start to experience new or abnormal symptoms after taking one of these medicines, seek care immediately if you start to experience difficulty breathing, feeling of your throat closing, facial swelling, or rash as these could be indications of a more serious allergic reaction   We are instructing patient's with COVID 19 or symptoms of COVID 19 to follow the below instructions regardless of vaccination status:  - Stay home for 5 days. - If you have no symptoms or your symptoms are resolving after 5 days, you can leave your house--> Continue to wear a mask around others for 5 additional days. - If you have a fever, continue to stay home until your fever resolves.days.   Please follow up with primary care within 3-5 days for re-evaluation- call prior to going to the office to make them aware of your symptoms as some offices are altering  their method of seeing patients with COVID 19 symptoms, we have also provided our [place] covid clinic for follow up as well.  Return to the ER for new or worsening symptoms including but not limited to increased work of breathing, increased/new abdominal pain, coughing up blood, chest pain, passing out, or any other concerns.       Person Under Monitoring Name: Dustin Hale  Location: [place] [address]   Infection Prevention Recommendations for Individuals Confirmed to have, or Being Evaluated for, 2019 Novel Coronavirus (COVID-19) Infection Who Receive Care at Home  Individuals who are confirmed to have, or are being evaluated for, COVID-19 should follow the prevention steps below until a healthcare provider or local or state health department says they can return to normal activities.  Stay home except to get medical care You should restrict activities outside your home, except for getting medical care. Do not go to work, school, or public areas, and do not use public transportation or taxis.  Call ahead before visiting your doctor Before your medical appointment, call the healthcare provider and tell them that you have, or are being evaluated for, COVID-19 infection. This will help the healthcare providers office take steps to keep other people from getting infected. Ask your healthcare provider to call the local or state health department.  Monitor your symptoms Seek prompt medical attention if your illness is worsening (e.g., difficulty breathing). Before going to your medical appointment, call the healthcare provider and tell them that you have, or are being evaluated for, COVID-19 infection.  Ask your healthcare provider to call the local or state health department.  Wear a facemask You should wear a facemask that covers your nose and mouth when you are in the same room with other people and when you visit a healthcare provider. People who live  with or visit you should also wear a facemask while they are in the same room with you.  Separate yourself from other people in your home As much as possible, you should stay in a different room from other people in your home. Also, you should use a separate bathroom, if available.  Avoid sharing household items You should not share dishes, drinking glasses, cups, eating utensils, towels, bedding, or other items with other people in your home. After using these items, you should wash them thoroughly with soap and water.  Cover your coughs and sneezes Cover your mouth and nose with a tissue when you cough or sneeze, or you can cough or sneeze into your sleeve. Throw used tissues in a lined trash can, and immediately wash your hands with soap and water for at least 20 seconds or use an alcohol-based hand rub.  Wash your [facility] your hands often and thoroughly with soap and water for at least 20 seconds. You can use an alcohol-based hand sanitizer if soap and water are not available and if your hands are not visibly dirty. Avoid touching your eyes, nose, and mouth with unwashed hands.   Prevention Steps for Caregivers and Household Members of Individuals Confirmed to have, or Being Evaluated for, COVID-19 Infection Being Cared for in the Home  If you live with, or provide care at home for, a person confirmed to have, or being evaluated for, COVID-19 infection please follow these guidelines to prevent infection:  Follow healthcare providers instructions Make sure that you understand and can help the patient follow any healthcare provider instructions for all care.  Provide for the patients basic needs You should help the patient with basic needs in the home and provide support for getting groceries, prescriptions, and other personal needs.  Monitor the patients symptoms If they are getting sicker, call his or her medical provider and tell them that the patient has, or is being  evaluated for, COVID-19 infection. This will help the healthcare providers office take steps to keep other people from getting infected. Ask the healthcare provider to call the local or state health department.  Limit the number of people who have contact with the patient If possible, have only one caregiver for the patient. Other household members should stay in another home or place of residence. If this is not possible, they should stay in another room, or be separated from the patient as much as possible. Use a separate bathroom, if available. Restrict visitors who do not have an essential need to be in the home.  Keep older adults, very young children, and other sick people away from the patient Keep older adults, very young children, and those who have compromised immune systems or chronic health conditions away from the patient. This includes people with chronic heart, lung, or kidney conditions, diabetes, and cancer.  Ensure good ventilation Make sure that shared spaces in the home have good air flow, such as from an air conditioner or an opened window, weather permitting.  Wash your hands often Wash your hands often and thoroughly with soap and water for at least 20 seconds. You can use an alcohol based hand sanitizer if soap and water are not available and  if your hands are not visibly dirty. Avoid touching your eyes, nose, and mouth with unwashed hands. Use disposable paper towels to dry your hands. If not available, use dedicated cloth towels and replace them when they become wet.  Wear a facemask and gloves Wear a disposable facemask at all times in the room and gloves when you touch or have contact with the patients blood, body fluids, and/or secretions or excretions, such as sweat, saliva, sputum, nasal mucus, vomit, urine, or feces.  Ensure the mask fits over your nose and mouth tightly, and do not touch it during use. Throw out disposable facemasks and gloves after using  them. Do not reuse. Wash your hands immediately after removing your facemask and gloves. If your personal clothing becomes contaminated, carefully remove clothing and launder. Wash your hands after handling contaminated clothing. Place all used disposable facemasks, gloves, and other waste in a lined container before disposing them with other household waste. Remove gloves and wash your hands immediately after handling these items.  Do not share dishes, glasses, or other household items with the patient Avoid sharing household items. You should not share dishes, drinking glasses, cups, eating utensils, towels, bedding, or other items with a patient who is confirmed to have, or being evaluated for, COVID-19 infection. After the person uses these items, you should wash them thoroughly with soap and water.  Wash laundry thoroughly Immediately remove and wash clothes or bedding that have blood, body fluids, and/or secretions or excretions, such as sweat, saliva, sputum, nasal mucus, vomit, urine, or feces, on them. Wear gloves when handling laundry from the patient. Read and follow directions on labels of laundry or clothing items and detergent. In general, wash and dry with the warmest temperatures recommended on the label.  Clean all areas the individual has used often Clean all touchable surfaces, such as counters, tabletops, doorknobs, bathroom fixtures, toilets, phones, keyboards, tablets, and bedside tables, every day. Also, clean any surfaces that may have blood, body fluids, and/or secretions or excretions on them. Wear gloves when cleaning surfaces the patient has come in contact with. Use a diluted bleach solution (e.g., dilute bleach with 1 part bleach and 10 parts water) or a household disinfectant with a label that says EPA-registered for coronaviruses. To make a bleach solution at home, add 1 tablespoon of bleach to 1 quart (4 cups) of water. For a larger supply, add  cup of bleach to 1  gallon (16 cups) of water. Read labels of cleaning products and follow recommendations provided on product labels. Labels contain instructions for safe and effective use of the cleaning product including precautions you should take when applying the product, such as wearing gloves or eye protection and making sure you have good ventilation during use of the product. Remove gloves and wash hands immediately after cleaning.  Monitor yourself for signs and symptoms of illness Caregivers and household members are considered close contacts, should monitor their health, and will be asked to limit movement outside of the home to the extent possible. Follow the monitoring steps for close contacts listed on the symptom monitoring form.   ? If you have additional questions, contact your local health department or call the epidemiologist on call at [phone] (available 24/7). ? This guidance is subject to change. For the most up-to-date guidance from [facility], please refer to their website: YouBlogs.pl

## 2020-09-29 ENCOUNTER — Telehealth: Payer: Self-pay

## 2020-09-29 ENCOUNTER — Telehealth: Payer: Self-pay | Admitting: Nurse Practitioner

## 2020-09-29 NOTE — Telephone Encounter (Signed)
Attempted to contact patient 5x for 3:30pm virtual visit. No answer.

## 2020-12-09 ENCOUNTER — Other Ambulatory Visit: Payer: Self-pay

## 2020-12-09 MED ORDER — AMOXICILLIN-POT CLAVULANATE 500-125 MG PO TABS
ORAL_TABLET | ORAL | 0 refills | Status: DC
  Filled 2020-12-09: qty 14, 7d supply, fill #0

## 2020-12-17 ENCOUNTER — Other Ambulatory Visit: Payer: Self-pay

## 2021-02-03 ENCOUNTER — Other Ambulatory Visit: Payer: Self-pay

## 2021-02-03 MED ORDER — AMOXICILLIN-POT CLAVULANATE 500-125 MG PO TABS
ORAL_TABLET | ORAL | 0 refills | Status: DC
  Filled 2021-02-03: qty 14, 7d supply, fill #0

## 2021-03-24 ENCOUNTER — Ambulatory Visit: Payer: Self-pay | Admitting: Critical Care Medicine

## 2021-09-09 ENCOUNTER — Ambulatory Visit: Payer: Self-pay | Admitting: Physician Assistant

## 2021-09-15 DIAGNOSIS — E041 Nontoxic single thyroid nodule: Secondary | ICD-10-CM | POA: Insufficient documentation

## 2021-11-18 ENCOUNTER — Other Ambulatory Visit: Payer: Self-pay | Admitting: Otolaryngology

## 2021-11-18 DIAGNOSIS — E041 Nontoxic single thyroid nodule: Secondary | ICD-10-CM

## 2021-12-03 ENCOUNTER — Other Ambulatory Visit: Payer: Self-pay | Admitting: Otolaryngology

## 2021-12-03 DIAGNOSIS — E041 Nontoxic single thyroid nodule: Secondary | ICD-10-CM

## 2021-12-07 ENCOUNTER — Ambulatory Visit
Admission: RE | Admit: 2021-12-07 | Discharge: 2021-12-07 | Disposition: A | Discharge status: Home / Self Care | Payer: Non-veteran care | Source: Ambulatory Visit | Attending: Otolaryngology | Admitting: Otolaryngology

## 2021-12-07 DIAGNOSIS — E041 Nontoxic single thyroid nodule: Secondary | ICD-10-CM

## 2021-12-16 ENCOUNTER — Other Ambulatory Visit (HOSPITAL_COMMUNITY)
Admission: RE | Admit: 2021-12-16 | Discharge: 2021-12-16 | Disposition: A | Discharge status: Home / Self Care | Payer: No Typology Code available for payment source | Source: Ambulatory Visit | Attending: Interventional Radiology | Admitting: Interventional Radiology

## 2021-12-16 ENCOUNTER — Ambulatory Visit
Admission: RE | Admit: 2021-12-16 | Discharge: 2021-12-16 | Disposition: A | Discharge status: Home / Self Care | Payer: Non-veteran care | Source: Ambulatory Visit | Attending: Otolaryngology | Admitting: Otolaryngology

## 2021-12-16 DIAGNOSIS — L739 Follicular disorder, unspecified: Secondary | ICD-10-CM | POA: Diagnosis present

## 2021-12-16 DIAGNOSIS — E041 Nontoxic single thyroid nodule: Secondary | ICD-10-CM

## 2021-12-25 LAB — CYTOLOGY - NON PAP

## 2022-01-09 ENCOUNTER — Encounter: Payer: Self-pay | Admitting: Critical Care Medicine

## 2022-01-09 DIAGNOSIS — M792 Neuralgia and neuritis, unspecified: Secondary | ICD-10-CM | POA: Insufficient documentation

## 2022-01-09 DIAGNOSIS — M25512 Pain in left shoulder: Secondary | ICD-10-CM | POA: Insufficient documentation

## 2022-01-09 DIAGNOSIS — Z5900 Homelessness unspecified: Secondary | ICD-10-CM | POA: Insufficient documentation

## 2022-01-09 DIAGNOSIS — M545 Low back pain, unspecified: Secondary | ICD-10-CM | POA: Insufficient documentation

## 2022-01-09 DIAGNOSIS — F32A Depression, unspecified: Secondary | ICD-10-CM | POA: Insufficient documentation

## 2022-01-09 DIAGNOSIS — F4320 Adjustment disorder, unspecified: Secondary | ICD-10-CM | POA: Insufficient documentation

## 2022-01-09 DIAGNOSIS — G4733 Obstructive sleep apnea (adult) (pediatric): Secondary | ICD-10-CM

## 2022-01-09 DIAGNOSIS — M542 Cervicalgia: Secondary | ICD-10-CM | POA: Insufficient documentation

## 2022-01-09 HISTORY — DX: Obstructive sleep apnea (adult) (pediatric): G47.33

## 2022-01-09 NOTE — Progress Notes (Incomplete)
? ?  Established Patient Office Visit ? ?Subjective   ?Patient ID: Dustin Hale, male    DOB: July 31, 1966  Age: 56 y.o. MRN: [id number] ? ?No chief complaint on file. ? ?Tdap hcv a1c foot uab  ?Former [facility] pt ? ? ? ? ?{History (Optional):23778} ? ?ROS ? ?  ?Objective:  ?  ? ?There were no vitals taken for this visit. ?{Vitals History (Optional):23777} ? ?Physical Exam ? ? ?No results found for any visits on 01/11/22. ? ?{Labs (Optional):23779} ? ?The ASCVD Risk score (Arnett DK, et al., 2019) failed to calculate for the following reasons: ?  The systolic blood pressure is missing ? ?  ?Assessment & Plan:  ? ?Problem List Items Addressed This Visit   ?None ? ? ?No follow-ups on file.  ? ? ?Asencion Noble, MD ? ?

## 2022-01-11 ENCOUNTER — Ambulatory Visit: Payer: Self-pay | Admitting: Critical Care Medicine

## 2022-02-07 NOTE — Progress Notes (Incomplete)
New Patient Office Visit  Subjective    Patient ID: Dustin Hale, male    DOB: 1966-07-24  Age: 56 y.o. MRN: [id number]  CC: No chief complaint on file.   HPI Dustin Hale presents to establish care Not seen since 07/2020 former [facility] PCP patient Eye foot  Outpatient Encounter Medications as of 02/08/2022  Medication Sig  . amoxicillin-clavulanate (AUGMENTIN) 500-125 MG tablet TAKE 1 TABLET EVERY 12 HOURS DAILY.  Marland Kitchen amoxicillin-clavulanate (AUGMENTIN) 500-125 MG tablet TAKE 1 TABLET EVERY 12 HOURS DAILY.  . benzonatate (TESSALON) 100 MG capsule Take 1 capsule (100 mg total) by mouth every 8 (eight) hours.  . bisacodyl (BISACODYL) 5 MG EC tablet Take 5 mg by mouth once. Dulcolax 5 mg tab take as directed for colonoscopy prep. (Patient not taking: Reported on 07/16/2020)  . blood glucose meter kit and supplies KIT Dispense based on patient and insurance preference. Use up to four times daily as directed. (FOR ICD-9 250.00, 250.01).  . Blood Glucose Monitoring Suppl (TRUE METRIX METER) W/DEVICE KIT 1 each by Does not apply route as needed.  . cyclobenzaprine (FLEXERIL) 5 MG tablet One tablet every 6-8 hours as needed for muscle spasm and 2 at bedtime  . diclofenac Sodium (VOLTAREN) 1 % GEL APPLY 4 GRAMS TO AFFECTED AREA FOUR TIMES A DAY AS NEEDED PAIN  . dicyclomine (BENTYL) 20 MG tablet Take 1 tablet (20 mg total) by mouth every 8 (eight) hours as needed for spasms.  . DULoxetine (CYMBALTA) 30 MG capsule TAKE ONE CAPSULE BY MOUTH ONCE A DAY FOR NEURITIS  . glimepiride (AMARYL) 4 MG tablet ONE PILL BEFORE THE FIRST MEAL OF THE DAY TO LOWER BLOOD SUGAR  . glucose blood (TRUE METRIX BLOOD GLUCOSE TEST) test strip 1 each by Other route 3 (three) times daily.  Marland Kitchen ibuprofen (ADVIL) 600 MG tablet Take 1 tablet (600 mg total) by mouth every 8 (eight) hours as needed for moderate pain. Eat before taking medication  . lidocaine (LIDODERM) 5 % Place 1 patch onto the skin daily. Remove & Discard patch  within 12 hours or as directed by MD  . metFORMIN (GLUCOPHAGE XR) 500 MG 24 hr tablet Take 1 tablet (500 mg total) by mouth daily after supper. Start with one pill daily after the evening meal and then increase to two pills after one week  . ondansetron (ZOFRAN ODT) 4 MG disintegrating tablet Take 1 tablet (4 mg total) by mouth every 8 (eight) hours as needed for nausea or vomiting.  . potassium chloride SA (KLOR-CON) 20 MEQ tablet Take 1 tablet (20 mEq total) by mouth daily.  . rosuvastatin (CRESTOR) 20 MG tablet TAKE 1 TABLET (20 MG TOTAL) BY MOUTH DAILY. TO LOWER LIPIDS/CHOLESTEROL  . simvastatin (ZOCOR) 20 MG tablet simvastatin 20 mg tablet  ONE PILL BY MOUTH EACH EVENING TO HELP LOWER CHOLESTEROL  . tamsulosin (FLOMAX) 0.4 MG CAPS capsule Take 1 capsule (0.4 mg total) by mouth daily.  Marland Kitchen terbinafine (LAMISIL AT) 1 % cream Apply 1 application topically 2 (two) times daily. X 10 days and then as needed  . TRUEplus Lancets 28G MISC 1 each by Does not apply route 3 (three) times daily.   Facility-Administered Encounter Medications as of 02/08/2022  Medication  . insulin regular (NOVOLIN R) 100 units/mL injection 12 Units    Past Medical History:  Diagnosis Date  . Diabetes mellitus without complication ([place])   . Hyperlipidemia   . Obesity   . Obstructive sleep apnea (adult) (pediatric) 01/09/2022  .  Poor dental hygiene     Past Surgical History:  Procedure Laterality Date  . NO PAST SURGERIES      Family History  Problem Relation Age of Onset  . Hypertension Mother   . Other Mother        fire  . Hypertension Father   . Other Father        car crash  . Hypertension Maternal Grandmother   . Heart attack Maternal Grandmother   . Arthritis Maternal Grandmother   . Hypertension Maternal Grandfather   . Hypertension Paternal Grandmother   . Stroke Other   . Colon cancer Neg Hx   . Rectal cancer Neg Hx   . Stomach cancer Neg Hx     Social History   Socioeconomic History  .  Marital status: Single    Spouse name: Not on file  . Number of children: 4  . Years of education: 2 yrs college  . Highest education level: Not on file  Occupational History  . Not on file  Tobacco Use  . Smoking status: Former    Packs/day: 0.33    Years: 32.00    Pack years: 10.56    Types: Cigarettes  . Smokeless tobacco: Never  . Tobacco comments:    quit smoking on 08/27/14  Vaping Use  . Vaping Use: Never used  Substance and Sexual Activity  . Alcohol use: Yes    Alcohol/week: 0.0 standard drinks    Comment: occasional   . Drug use: Not Currently    Types: Marijuana    Comment: Last use August 2019  . Sexual activity: Yes    Partners: Female  Other Topics Concern  . Not on file  Social History Narrative   Lives with sig other   No caffeine   Social Determinants of Health   Financial Resource Strain: Not on file  Food Insecurity: Not on file  Transportation Needs: Not on file  Physical Activity: Not on file  Stress: Not on file  Social Connections: Not on file  Intimate Partner Violence: Not on file    ROS      Objective    There were no vitals taken for this visit.  Physical Exam  {Labs (Optional):23779}    Assessment & Plan:   Problem List Items Addressed This Visit   None   No follow-ups on file.   Asencion Noble, MD

## 2022-02-08 ENCOUNTER — Ambulatory Visit: Payer: No Typology Code available for payment source | Admitting: Critical Care Medicine

## 2022-03-04 ENCOUNTER — Ambulatory Visit: Payer: No Typology Code available for payment source | Admitting: Critical Care Medicine

## 2022-04-01 ENCOUNTER — Ambulatory Visit: Payer: No Typology Code available for payment source | Admitting: Physician Assistant

## 2022-04-01 DIAGNOSIS — E1165 Type 2 diabetes mellitus with hyperglycemia: Secondary | ICD-10-CM

## 2022-07-23 ENCOUNTER — Ambulatory Visit: Payer: No Typology Code available for payment source | Admitting: Nurse Practitioner

## 2022-08-12 ENCOUNTER — Other Ambulatory Visit: Payer: Self-pay | Admitting: Internal Medicine

## 2022-08-12 DIAGNOSIS — E041 Nontoxic single thyroid nodule: Secondary | ICD-10-CM

## 2022-08-17 ENCOUNTER — Ambulatory Visit
Admission: RE | Admit: 2022-08-17 | Discharge: 2022-08-17 | Disposition: A | Discharge status: Home / Self Care | Payer: No Typology Code available for payment source | Source: Ambulatory Visit | Attending: Internal Medicine | Admitting: Internal Medicine

## 2022-08-17 DIAGNOSIS — E041 Nontoxic single thyroid nodule: Secondary | ICD-10-CM

## 2022-09-20 ENCOUNTER — Ambulatory Visit: Payer: No Typology Code available for payment source | Admitting: Nurse Practitioner

## 2022-09-21 ENCOUNTER — Other Ambulatory Visit: Payer: Self-pay

## 2022-09-21 ENCOUNTER — Encounter: Payer: Self-pay | Admitting: Nurse Practitioner

## 2022-09-21 ENCOUNTER — Ambulatory Visit: Payer: No Typology Code available for payment source | Attending: Nurse Practitioner | Admitting: Nurse Practitioner

## 2022-09-21 ENCOUNTER — Telehealth: Payer: Self-pay | Admitting: Nurse Practitioner

## 2022-09-21 VITALS — BP 123/73 | HR 68 | Ht 69.0 in | Wt 224.0 lb

## 2022-09-21 DIAGNOSIS — R350 Frequency of micturition: Secondary | ICD-10-CM

## 2022-09-21 DIAGNOSIS — Z5989 Other problems related to housing and economic circumstances: Secondary | ICD-10-CM | POA: Insufficient documentation

## 2022-09-21 DIAGNOSIS — F329 Major depressive disorder, single episode, unspecified: Secondary | ICD-10-CM | POA: Insufficient documentation

## 2022-09-21 DIAGNOSIS — N401 Enlarged prostate with lower urinary tract symptoms: Secondary | ICD-10-CM

## 2022-09-21 DIAGNOSIS — M75102 Unspecified rotator cuff tear or rupture of left shoulder, not specified as traumatic: Secondary | ICD-10-CM | POA: Insufficient documentation

## 2022-09-21 DIAGNOSIS — R7989 Other specified abnormal findings of blood chemistry: Secondary | ICD-10-CM

## 2022-09-21 DIAGNOSIS — E7800 Pure hypercholesterolemia, unspecified: Secondary | ICD-10-CM

## 2022-09-21 DIAGNOSIS — E1165 Type 2 diabetes mellitus with hyperglycemia: Secondary | ICD-10-CM

## 2022-09-21 DIAGNOSIS — E782 Mixed hyperlipidemia: Secondary | ICD-10-CM

## 2022-09-21 DIAGNOSIS — Z659 Problem related to unspecified psychosocial circumstances: Secondary | ICD-10-CM | POA: Insufficient documentation

## 2022-09-21 DIAGNOSIS — Z1159 Encounter for screening for other viral diseases: Secondary | ICD-10-CM

## 2022-09-21 DIAGNOSIS — G4733 Obstructive sleep apnea (adult) (pediatric): Secondary | ICD-10-CM

## 2022-09-21 MED ORDER — TAMSULOSIN HCL 0.4 MG PO CAPS
0.4000 mg | ORAL_CAPSULE | Freq: Every day | ORAL | 1 refills | Status: DC
  Filled 2022-09-21: qty 90, 90d supply, fill #0

## 2022-09-21 MED ORDER — ROSUVASTATIN CALCIUM 20 MG PO TABS
ORAL_TABLET | ORAL | 11 refills | Status: DC
  Filled 2022-09-21: qty 30, 30d supply, fill #0

## 2022-09-21 NOTE — Patient Instructions (Signed)
UROLOGY Dr. Nickolas Madrid Urologist in [place], [place] Address: [address] [place], [place],  [address] Phone: [phone]

## 2022-09-21 NOTE — Progress Notes (Signed)
Cpap-need  Frequent urinating-ongoing a year Prostate levels check

## 2022-09-21 NOTE — Progress Notes (Signed)
Assessment & Plan:  Dustin Hale was seen today for diabetes.  Diagnoses and all orders for this visit:  Type 2 diabetes mellitus with hyperglycemia, without long-term current use of insulin (HCC) -     Hemoglobin A1c -     Microalbumin / creatinine urine ratio -     Ambulatory referral to Ophthalmology -     CMP14+EGFR  Mixed dyslipidemia -     rosuvastatin (CRESTOR) 20 MG tablet; TAKE 1 TABLET (20 MG TOTAL) BY MOUTH DAILY. TO LOWER LIPIDS/CHOLESTEROL  Benign prostatic hyperplasia with urinary frequency -     PSA -     Urinalysis, Complete -     tamsulosin (FLOMAX) 0.4 MG CAPS capsule; Take 1 capsule (0.4 mg total) by mouth daily. -     Microscopic Examination  Need for hepatitis C screening test -     HCV Ab w Reflex to Quant PCR -     Interpretation:  Hypercholesterolemia -     Lipid panel  Abnormal CBC -     CBC with Differential  OSA (obstructive sleep apnea) -     PSG Sleep Study; Future    Patient has been counseled on age-appropriate routine health concerns for screening and prevention. These are reviewed and up-to-date. Referrals have been placed accordingly. Immunizations are up-to-date or declined.    Subjective:   Chief Complaint  Patient presents with   Diabetes   HPI Dustin Hale 57 y.o. male presents to office today to establish care with a new provider here in the office.   PMH: Depression, anxiety, HPL, prediabetes, homelessness, cervical radiculopathy,   Benign prostatic hyperplasia with LUTS, Obstructive sleep apnea syndrome, Low IQ   Prediabetes Well controlled with glimepiride 4 mg daily. Could not tolerate metformin. Only checks blood glucose levels once a week.  Lab Results  Component Value Date   HGBA1C 6.0 (H) 09/21/2022   Lab Results  Component Value Date   LDLCALC 127 (H) 09/21/2022      HTN Blood pressure is well controlled. He is not currently taking any antihypertensives.  BP Readings from Last 3 Encounters:  09/21/22 123/73   09/23/20 112/76  07/16/20 115/80      OSA Needs inhome sleep study based on recent home study records from 2020.  Per records:  He completed a home sleep study that demonstrated mild sleep apnea  with an AHI of 8 events per hour, Time spent with oxygen saturation at or  below 88% = 3 hours and 35 minutes. With a history of mild obstructive sleep apnea and given that he did a home sleep test and time spent with oxygen saturation at or below 88% = 3 hours and 35 minutes. Recommend completing full-night in-lab PSG.  He endorses the following symptoms:  Valu.Nieves ] I am a restless sleeper [ X] I snore Valu.Nieves ] I have been told I stop breathing while I sleep Valu.Nieves ] I gasp for air during the night '[ ]'$  I awaken frequently during the night Valu.Nieves ] I visit the bathroom frequently during the night Valu.Nieves ] I sweat during the night Valu.Nieves ] I feel unrefreshed in the morning Valu.Nieves ] I awaken with a dry mouth   Benign Prostatic Hypertrophy: Patient complains of lower urinary tract symptoms. Onset of symptoms was several years ago and was gradual in onset.Marland Kitchen He has no personal history of prostate cancer.  He reports frequency, incomplete emptying, nocturia three times a night, and urgency.  He denies flank pain, gross hematuria,  kidney stones, and recurrent UTI.  He was prescribed tamsulosin in the past and has not been taking this as prescribed. Will restart today.  Review of Systems  Constitutional:  Negative for fever, malaise/fatigue and weight loss.  HENT: Negative.  Negative for nosebleeds.   Eyes: Negative.  Negative for blurred vision, double vision and photophobia.  Respiratory: Negative.  Negative for cough and shortness of breath.   Cardiovascular: Negative.  Negative for chest pain, palpitations and leg swelling.  Gastrointestinal: Negative.  Negative for heartburn, nausea and vomiting.  Genitourinary:  Positive for frequency and urgency. Negative for dysuria, flank pain and hematuria.  Musculoskeletal:  Negative.  Negative for myalgias.  Neurological: Negative.  Negative for dizziness, focal weakness, seizures and headaches.  Psychiatric/Behavioral: Negative.  Negative for suicidal ideas.     Past Medical History:  Diagnosis Date   Diabetes mellitus without complication ([place])    Hyperlipidemia    Obesity    Obstructive sleep apnea (adult) (pediatric) 01/09/2022   Poor dental hygiene     Past Surgical History:  Procedure Laterality Date   NO PAST SURGERIES      Family History  Problem Relation Age of Onset   Hypertension Mother    Other Mother        fire   Hypertension Father    Other Father        car crash   Hypertension Maternal Grandmother    Heart attack Maternal Grandmother    Arthritis Maternal Grandmother    Hypertension Maternal Grandfather    Hypertension Paternal Grandmother    Stroke Other    Colon cancer Neg Hx    Rectal cancer Neg Hx    Stomach cancer Neg Hx     Social History Reviewed with no changes to be made today.   Outpatient Medications Prior to Visit  Medication Sig Dispense Refill   blood glucose meter kit and supplies KIT Dispense based on patient and insurance preference. Use up to four times daily as directed. (FOR ICD-9 250.00, 250.01). 1 each 2   Blood Glucose Monitoring Suppl (TRUE METRIX METER) W/DEVICE KIT 1 each by Does not apply route as needed. 1 kit 0   diclofenac Sodium (VOLTAREN) 1 % GEL APPLY 4 GRAMS TO AFFECTED AREA FOUR TIMES A DAY AS NEEDED PAIN     DULoxetine (CYMBALTA) 30 MG capsule TAKE ONE CAPSULE BY MOUTH ONCE A DAY FOR NEURITIS     glucose blood (TRUE METRIX BLOOD GLUCOSE TEST) test strip 1 each by Other route 3 (three) times daily. 100 each 11   lidocaine (LIDODERM) 5 % Place 1 patch onto the skin daily. Remove & Discard patch within 12 hours or as directed by MD 30 patch 0   ondansetron (ZOFRAN ODT) 4 MG disintegrating tablet Take 1 tablet (4 mg total) by mouth every 8 (eight) hours as needed for nausea or vomiting. 8  tablet 0   potassium chloride SA (KLOR-CON) 20 MEQ tablet Take 1 tablet (20 mEq total) by mouth daily. 3 tablet 0   terbinafine (LAMISIL AT) 1 % cream Apply 1 application topically 2 (two) times daily. X 10 days and then as needed 30 g 0   TRUEplus Lancets 28G MISC 1 each by Does not apply route 3 (three) times daily. 100 each 11   amoxicillin-clavulanate (AUGMENTIN) 500-125 MG tablet TAKE 1 TABLET EVERY 12 HOURS DAILY. 14 tablet 0   amoxicillin-clavulanate (AUGMENTIN) 500-125 MG tablet TAKE 1 TABLET EVERY 12 HOURS DAILY. 14 tablet 0  benzonatate (TESSALON) 100 MG capsule Take 1 capsule (100 mg total) by mouth every 8 (eight) hours. 21 capsule 0   cyclobenzaprine (FLEXERIL) 5 MG tablet One tablet every 6-8 hours as needed for muscle spasm and 2 at bedtime 30 tablet 1   dicyclomine (BENTYL) 20 MG tablet Take 1 tablet (20 mg total) by mouth every 8 (eight) hours as needed for spasms. 15 tablet 0   glimepiride (AMARYL) 4 MG tablet ONE PILL BEFORE THE FIRST MEAL OF THE DAY TO LOWER BLOOD SUGAR 30 tablet 4   ibuprofen (ADVIL) 600 MG tablet Take 1 tablet (600 mg total) by mouth every 8 (eight) hours as needed for moderate pain. Eat before taking medication 30 tablet 0   metFORMIN (GLUCOPHAGE XR) 500 MG 24 hr tablet Take 1 tablet (500 mg total) by mouth daily after supper. Start with one pill daily after the evening meal and then increase to two pills after one week 30 tablet 2   simvastatin (ZOCOR) 20 MG tablet simvastatin 20 mg tablet  ONE PILL BY MOUTH EACH EVENING TO HELP LOWER CHOLESTEROL     tamsulosin (FLOMAX) 0.4 MG CAPS capsule Take 1 capsule (0.4 mg total) by mouth daily. 30 capsule 11   bisacodyl (BISACODYL) 5 MG EC tablet Take 5 mg by mouth once. Dulcolax 5 mg tab take as directed for colonoscopy prep. (Patient not taking: Reported on 07/16/2020)     rosuvastatin (CRESTOR) 20 MG tablet TAKE 1 TABLET (20 MG TOTAL) BY MOUTH DAILY. TO LOWER LIPIDS/CHOLESTEROL (Patient not taking: Reported on  09/21/2022) 30 tablet 11   insulin regular (NOVOLIN R) 100 units/mL injection 12 Units      No facility-administered medications prior to visit.    No Known Allergies     Objective:    BP 123/73   Pulse 68   Ht '5\' 9"'$  (1.753 m)   Wt 224 lb (101.6 kg)   SpO2 96%   BMI 33.08 kg/m  Wt Readings from Last 3 Encounters:  09/21/22 224 lb (101.6 kg)  07/16/20 225 lb (102.1 kg)  11/01/19 236 lb (107 kg)    Physical Exam Vitals and nursing note reviewed.  Constitutional:      Appearance: He is well-developed.  HENT:     Head: Normocephalic and atraumatic.  Cardiovascular:     Rate and Rhythm: Normal rate and regular rhythm.     Heart sounds: Normal heart sounds. No murmur heard.    No friction rub. No gallop.  Pulmonary:     Effort: Pulmonary effort is normal. No tachypnea or respiratory distress.     Breath sounds: Normal breath sounds. No decreased breath sounds, wheezing, rhonchi or rales.  Chest:     Chest wall: No tenderness.  Abdominal:     General: Bowel sounds are normal.     Palpations: Abdomen is soft.  Musculoskeletal:        General: Normal range of motion.     Cervical back: Normal range of motion.  Skin:    General: Skin is warm and dry.  Neurological:     Mental Status: He is alert and oriented to person, place, and time.     Coordination: Coordination normal.  Psychiatric:        Behavior: Behavior normal. Behavior is cooperative.        Thought Content: Thought content normal.        Judgment: Judgment normal.          Patient has been counseled extensively about  nutrition and exercise as well as the importance of adherence with medications and regular follow-up. The patient was given clear instructions to go to ER or return to medical center if symptoms don't improve, worsen or new problems develop. The patient verbalized understanding.   Follow-up: Return in about 3 months (around 12/21/2022).   Gildardo Pounds, FNP-BC [facility] [place], [place]   09/23/2022, 6:29 PM

## 2022-09-21 NOTE — Telephone Encounter (Signed)
Copied from [place] [id number]. Topic: General - Other >> Sep 21, 2022  2:58 PM Chapman Fitch wrote: Reason for CRM: pt needs to speak with Zelda / he stated its urgent and about his diabetes / pt just left office and forgot to mention issue / please advise

## 2022-09-22 ENCOUNTER — Other Ambulatory Visit: Payer: Self-pay | Admitting: Nurse Practitioner

## 2022-09-22 ENCOUNTER — Other Ambulatory Visit: Payer: Self-pay

## 2022-09-22 DIAGNOSIS — E1165 Type 2 diabetes mellitus with hyperglycemia: Secondary | ICD-10-CM

## 2022-09-22 LAB — MICROALBUMIN / CREATININE URINE RATIO
Creatinine, Urine: 249 mg/dL
Microalb/Creat Ratio: 7 mg/g creat (ref 0–29)
Microalbumin, Urine: 16.4 ug/mL

## 2022-09-22 LAB — CBC WITH DIFFERENTIAL/PLATELET
Basophils Absolute: 0 10*3/uL (ref 0.0–0.2)
Basos: 0 %
EOS (ABSOLUTE): 0.3 10*3/uL (ref 0.0–0.4)
Eos: 5 %
Hematocrit: 42.7 % (ref 37.5–51.0)
Hemoglobin: 14.6 g/dL (ref 13.0–17.7)
Immature Grans (Abs): 0 10*3/uL (ref 0.0–0.1)
Immature Granulocytes: 0 %
Lymphocytes Absolute: 2.3 10*3/uL (ref 0.7–3.1)
Lymphs: 41 %
MCH: 30.3 pg (ref 26.6–33.0)
MCHC: 34.2 g/dL (ref 31.5–35.7)
MCV: 89 fL (ref 79–97)
Monocytes Absolute: 0.4 10*3/uL (ref 0.1–0.9)
Monocytes: 8 %
Neutrophils Absolute: 2.5 10*3/uL (ref 1.4–7.0)
Neutrophils: 46 %
Platelets: 184 10*3/uL (ref 150–450)
RBC: 4.82 x10E6/uL (ref 4.14–5.80)
RDW: 12.8 % (ref 11.6–15.4)
WBC: 5.5 10*3/uL (ref 3.4–10.8)

## 2022-09-22 LAB — LIPID PANEL
Chol/HDL Ratio: 5.5 ratio — ABNORMAL HIGH (ref 0.0–5.0)
Cholesterol, Total: 193 mg/dL (ref 100–199)
HDL: 35 mg/dL — ABNORMAL LOW (ref >39)
LDL Chol Calc (NIH): 127 mg/dL — ABNORMAL HIGH (ref 0–99)
Triglycerides: 170 mg/dL — ABNORMAL HIGH (ref 0–149)
VLDL Cholesterol Cal: 31 mg/dL (ref 5–40)

## 2022-09-22 LAB — URINALYSIS, COMPLETE
Bilirubin, UA: NEGATIVE
Glucose, UA: NEGATIVE
Ketones, UA: NEGATIVE
Leukocytes,UA: NEGATIVE
Nitrite, UA: NEGATIVE
Protein,UA: NEGATIVE
Specific Gravity, UA: 1.024 (ref 1.005–1.030)
Urobilinogen, Ur: 1 mg/dL (ref 0.2–1.0)
pH, UA: 5.5 (ref 5.0–7.5)

## 2022-09-22 LAB — CMP14+EGFR
ALT: 16 IU/L (ref 0–44)
AST: 18 IU/L (ref 0–40)
Albumin/Globulin Ratio: 1.7 (ref 1.2–2.2)
Albumin: 4.5 g/dL (ref 3.8–4.9)
Alkaline Phosphatase: 138 IU/L — ABNORMAL HIGH (ref 44–121)
BUN/Creatinine Ratio: 9 (ref 9–20)
BUN: 9 mg/dL (ref 6–24)
Bilirubin Total: 0.7 mg/dL (ref 0.0–1.2)
CO2: 23 mmol/L (ref 20–29)
Calcium: 9.6 mg/dL (ref 8.7–10.2)
Chloride: 104 mmol/L (ref 96–106)
Creatinine, Ser: 0.99 mg/dL (ref 0.76–1.27)
Globulin, Total: 2.7 g/dL (ref 1.5–4.5)
Glucose: 99 mg/dL (ref 70–99)
Potassium: 4.2 mmol/L (ref 3.5–5.2)
Sodium: 140 mmol/L (ref 134–144)
Total Protein: 7.2 g/dL (ref 6.0–8.5)
eGFR: 90 mL/min/{1.73_m2} (ref >59)

## 2022-09-22 LAB — MICROSCOPIC EXAMINATION
Bacteria, UA: NONE SEEN
Casts: NONE SEEN /lpf
Epithelial Cells (non renal): NONE SEEN /hpf (ref 0–10)
WBC, UA: NONE SEEN /hpf (ref 0–5)

## 2022-09-22 LAB — HCV INTERPRETATION

## 2022-09-22 LAB — PSA: Prostate Specific Ag, Serum: 1.1 ng/mL (ref 0.0–4.0)

## 2022-09-22 LAB — HEMOGLOBIN A1C
Est. average glucose Bld gHb Est-mCnc: 126 mg/dL
Hgb A1c MFr Bld: 6 % — ABNORMAL HIGH (ref 4.8–5.6)

## 2022-09-22 LAB — HCV AB W REFLEX TO QUANT PCR: HCV Ab: NONREACTIVE

## 2022-09-22 MED ORDER — GLIMEPIRIDE 4 MG PO TABS
4.0000 mg | ORAL_TABLET | Freq: Every day | ORAL | 4 refills | Status: DC
  Filled 2022-09-22: qty 30, 30d supply, fill #0

## 2022-09-22 NOTE — Telephone Encounter (Signed)
S/w patient and answered questions related to DM

## 2022-09-23 ENCOUNTER — Encounter: Payer: Self-pay | Admitting: Nurse Practitioner

## 2022-10-12 ENCOUNTER — Ambulatory Visit (HOSPITAL_BASED_OUTPATIENT_CLINIC_OR_DEPARTMENT_OTHER): Payer: No Typology Code available for payment source | Attending: Nurse Practitioner | Admitting: Internal Medicine

## 2022-10-12 VITALS — Ht 69.0 in | Wt 240.0 lb

## 2022-10-12 DIAGNOSIS — G4733 Obstructive sleep apnea (adult) (pediatric): Secondary | ICD-10-CM | POA: Insufficient documentation

## 2022-10-16 DIAGNOSIS — G4733 Obstructive sleep apnea (adult) (pediatric): Secondary | ICD-10-CM

## 2022-10-16 NOTE — Procedures (Signed)
    Patient Name: Dustin Hale, Dustin Hale Date: 10/12/2022 Gender: Male D.O.B: June 18, 1966 Age (years): 50 Referring Provider: Gildardo Pounds NP Height (inches): 24 Interpreting Physician: Baird Lyons MD, ABSM Weight (lbs): 240 RPSGT: Carolin Coy BMI: 35 MRN: [id number] Neck Size: 16.50  CLINICAL INFORMATION Sleep Study Type: NPSG Indication for sleep study: Depression, Diabetes, Snoring Epworth Sleepiness Score: 7  SLEEP STUDY TECHNIQUE As per the AASM Manual for the Scoring of Sleep and Associated Events v2.3 (April 2016) with a hypopnea requiring 4% desaturations.  The channels recorded and monitored were frontal, central and occipital EEG, electrooculogram (EOG), submentalis EMG (chin), nasal and oral airflow, thoracic and abdominal wall motion, anterior tibialis EMG, snore microphone, electrocardiogram, and pulse oximetry.  MEDICATIONS Medications self-administered by patient taken the night of the study : none reported  SLEEP ARCHITECTURE The study was initiated at 10:38:49 PM and ended at 4:36:14 AM.  Sleep onset time was 5.9 minutes and the sleep efficiency was 79.6%. The total sleep time was 284.5 minutes.  Stage REM latency was 132.5 minutes.  The patient spent 27.2% of the night in stage N1 sleep, 53.1% in stage N2 sleep, 0.0% in stage N3 and 19.7% in REM.  Alpha intrusion was absent.  Supine sleep was 0.00%.  RESPIRATORY PARAMETERS The overall apnea/hypopnea index (AHI) was 13.5 per hour. There were 6 total apneas, including 6 obstructive, 0 central and 0 mixed apneas. There were 58 hypopneas and 204 RERAs.  The AHI during Stage REM sleep was 3.2 per hour.  AHI while supine was N/A per hour.  The mean oxygen saturation was 93.5%. The minimum SpO2 during sleep was 84.0%.  moderate snoring was noted during this study.  CARDIAC DATA The 2 lead EKG demonstrated sinus rhythm. The mean heart rate was 62.7 beats per minute. Other EKG findings include:  None.  LEG MOVEMENT DATA The total PLMS were 0 with a resulting PLMS index of 0.0. Associated arousal with leg movement index was 0.2 .  IMPRESSIONS - Mild obstructive sleep apnea occurred during this study (AHI = 13.5/h). - Mild oxygen desaturation was noted during this study (Min O2 = 84.0%,  Mean 93.5%). - The patient snored with moderate snoring volume. - No cardiac abnormalities were noted during this study. - Clinically significant periodic limb movements did not occur during sleep. No significant associated arousals.  DIAGNOSIS - Obstructive Sleep Apnea (G47.33)  RECOMMENDATIONS - Suggest CPAP titration sleep study or autopap. Other options would be based on clinical judgment. - Be careful with alcohol, sedatives and other CNS depressants that may worsen sleep apnea and disrupt normal sleep architecture. - Sleep hygiene should be reviewed to assess factors that may improve sleep quality. - Weight management and regular exercise should be initiated or continued if appropriate.  [Electronically signed] 10/16/2022 11:45 AM  Baird Lyons MD, ABSM Diplomate, American Board of Sleep Medicine NPI: [id number]                         [facility]  ELECTRONICALLY SIGNED ON:  10/16/2022, 11:42 AM [place] PH: [phone]   FX: [phone] [place]

## 2022-12-21 ENCOUNTER — Ambulatory Visit: Payer: No Typology Code available for payment source | Attending: Nurse Practitioner | Admitting: Nurse Practitioner

## 2023-02-03 ENCOUNTER — Other Ambulatory Visit: Payer: Self-pay

## 2023-02-03 ENCOUNTER — Encounter: Payer: Self-pay | Admitting: Physician Assistant

## 2023-02-03 ENCOUNTER — Ambulatory Visit: Payer: No Typology Code available for payment source | Attending: Physician Assistant | Admitting: Physician Assistant

## 2023-02-03 VITALS — BP 124/64 | HR 94 | Wt 217.6 lb

## 2023-02-03 DIAGNOSIS — G4733 Obstructive sleep apnea (adult) (pediatric): Secondary | ICD-10-CM

## 2023-02-03 DIAGNOSIS — E782 Mixed hyperlipidemia: Secondary | ICD-10-CM

## 2023-02-03 DIAGNOSIS — R350 Frequency of micturition: Secondary | ICD-10-CM

## 2023-02-03 DIAGNOSIS — N401 Enlarged prostate with lower urinary tract symptoms: Secondary | ICD-10-CM

## 2023-02-03 DIAGNOSIS — E1165 Type 2 diabetes mellitus with hyperglycemia: Secondary | ICD-10-CM | POA: Diagnosis not present

## 2023-02-03 DIAGNOSIS — M25512 Pain in left shoulder: Secondary | ICD-10-CM

## 2023-02-03 DIAGNOSIS — F3289 Other specified depressive episodes: Secondary | ICD-10-CM

## 2023-02-03 DIAGNOSIS — Z7984 Long term (current) use of oral hypoglycemic drugs: Secondary | ICD-10-CM

## 2023-02-03 LAB — POCT GLYCOSYLATED HEMOGLOBIN (HGB A1C): HbA1c, POC (controlled diabetic range): 6 % (ref 0.0–7.0)

## 2023-02-03 LAB — GLUCOSE, POCT (MANUAL RESULT ENTRY): POC Glucose: 81 mg/dl (ref 70–99)

## 2023-02-03 MED ORDER — DEXCOM G7 SENSOR MISC
1.0000 | 4 refills | Status: DC | PRN
  Filled 2023-02-03: qty 1, fill #0

## 2023-02-03 MED ORDER — DICLOFENAC SODIUM 1 % EX GEL
CUTANEOUS | 2 refills | Status: DC
  Filled 2023-02-03: qty 100, 16d supply, fill #0

## 2023-02-03 MED ORDER — DEXCOM G7 RECEIVER DEVI
1.0000 | 0 refills | Status: DC | PRN
  Filled 2023-02-03: qty 1, fill #0

## 2023-02-03 MED ORDER — DULOXETINE HCL 30 MG PO CPEP
30.0000 mg | ORAL_CAPSULE | Freq: Every day | ORAL | 5 refills | Status: DC
  Filled 2023-02-03: qty 30, 30d supply, fill #0

## 2023-02-03 NOTE — Progress Notes (Signed)
Patient ID: Dustin Hale, male   DOB: June 06, 1966, 57 y.o.   MRN: [id number]   Dustin Hale, is a 57 y.o. male  [id number]  [id number]  DOB - 12/11/1965  Chief Complaint  Patient presents with   Diabetes       Subjective:   Dustin Hale is a 57 y.o. male here today for recheck diabetes.  He wants a dexcom 7.  He says he never got his sleep study results.  He denies any new issues or concerns.  He only needs RF on some meds.     No problems updated.  ALLERGIES: No Known Allergies  PAST MEDICAL HISTORY: Past Medical History:  Diagnosis Date   Diabetes mellitus without complication (HCC)    Hyperlipidemia    Obesity    Obstructive sleep apnea (adult) (pediatric) 01/09/2022   Poor dental hygiene     MEDICATIONS AT HOME: Prior to Admission medications   Medication Sig Start Date End Date Taking? Authorizing Provider  blood glucose meter kit and supplies KIT Dispense based on patient and insurance preference. Use up to four times daily as directed. (FOR ICD-9 250.00, 250.01). 09/22/14  Yes Emokpae, Ejiroghene E, MD  Blood Glucose Monitoring Suppl (TRUE METRIX METER) W/DEVICE KIT 1 each by Does not apply route as needed. 06/20/15  Yes Funches, Gerilyn Nestle, MD  Continuous Glucose Receiver (DEXCOM G7 RECEIVER) DEVI 1 each by Does not apply route as needed. 02/03/23  Yes Kaemon Barnett M, PA-C  Continuous Glucose Sensor (DEXCOM G7 SENSOR) MISC 1 each by Does not apply route as needed. 02/03/23  Yes Tajon Moring M, PA-C  glimepiride (AMARYL) 4 MG tablet Take 1 tablet (4 mg total) by mouth before first meal of the day to lower blood sugar. 09/22/22  Yes Claiborne Rigg, NP  glucose blood (TRUE METRIX BLOOD GLUCOSE TEST) test strip 1 each by Other route 3 (three) times daily. 07/16/20  Yes Fulp, Cammie, MD  lidocaine (LIDODERM) 5 % Place 1 patch onto the skin daily. Remove & Discard patch within 12 hours or as directed by MD 10/28/19  Yes Henderly, Britni A, PA-C  ondansetron (ZOFRAN ODT)  4 MG disintegrating tablet Take 1 tablet (4 mg total) by mouth every 8 (eight) hours as needed for nausea or vomiting. 09/23/20  Yes Petrucelli, Samantha R, PA-C  rosuvastatin (CRESTOR) 20 MG tablet TAKE 1 TABLET (20 MG TOTAL) BY MOUTH DAILY. TO LOWER LIPIDS/CHOLESTEROL 09/21/22 09/21/23 Yes Claiborne Rigg, NP  tamsulosin (FLOMAX) 0.4 MG CAPS capsule Take 1 capsule (0.4 mg total) by mouth daily. 09/21/22  Yes Claiborne Rigg, NP  terbinafine (LAMISIL AT) 1 % cream Apply 1 application topically 2 (two) times daily. X 10 days and then as needed 08/22/19  Yes Fulp, Cammie, MD  TRUEplus Lancets 28G MISC 1 each by Does not apply route 3 (three) times daily. 07/16/20  Yes Fulp, Cammie, MD  diclofenac Sodium (VOLTAREN) 1 % GEL APPLY 4 GRAMS TO AFFECTED AREA FOUR TIMES A DAY AS NEEDED PAIN 02/03/23   Anders Simmonds, PA-C  DULoxetine (CYMBALTA) 30 MG capsule TAKE ONE CAPSULE BY MOUTH ONCE A DAY FOR NEURITIS 02/03/23   Cristen Murcia, Marylene Land M, PA-C    ROS: Neg HEENT Neg resp Neg cardiac Neg GI Neg GU Neg MS Neg psych Neg neuro  Objective:   Vitals:   02/03/23 0904 02/03/23 0917  BP: (!) 142/82 124/64  Pulse: 94   SpO2: 97%   Weight: 217 lb 9.6 oz (98.7 kg)    Exam  General appearance : Awake, alert, not in any distress. Speech Clear. Not toxic looking HEENT: Atraumatic and Normocephalic Neck: Supple, no JVD. No cervical lymphadenopathy.  Chest: Good air entry bilaterally, CTAB.  No rales/rhonchi/wheezing CVS: S1 S2 regular, no murmurs.  Extremities: B/L Lower Ext shows no edema, both legs are warm to touch Neurology: Awake alert, and oriented X 3, CN II-XII intact, Non focal Skin: No Rash  Data Review Lab Results  Component Value Date   HGBA1C 6.0 02/03/2023   HGBA1C 6.0 (H) 09/21/2022   HGBA1C 12.4 (A) 07/16/2020    Assessment & Plan   1. Type 2 diabetes mellitus with hyperglycemia, without long-term current use of insulin (HCC) At goal - Glucose (CBG) - HgB A1c - Continuous  Glucose Receiver (DEXCOM G7 RECEIVER) DEVI; 1 each by Does not apply route as needed.  Dispense: 1 each; Refill: 0 - Continuous Glucose Sensor (DEXCOM G7 SENSOR) MISC; 1 each by Does not apply route as needed.  Dispense: 1 each; Refill: 4  2. Mixed dyslipidemia Continue crestor  3. Obstructive sleep apnea (adult) (pediatric) Titration ordered-results reviewed - Cpap titration; Future  4. Benign prostatic hyperplasia with urinary frequency Continue flomax  5. Other depression stable - DULoxetine (CYMBALTA) 30 MG capsule; TAKE ONE CAPSULE BY MOUTH ONCE A DAY FOR NEURITIS  Dispense: 30 capsule; Refill: 5  6. Left shoulder pain, unspecified chronicity - diclofenac Sodium (VOLTAREN) 1 % GEL; APPLY 4 GRAMS TO AFFECTED AREA FOUR TIMES A DAY AS NEEDED PAIN  Dispense: 100 g; Refill: 2    Return in about 6 months (around 08/06/2023) for PCP for chronic conditions.  The patient was given clear instructions to go to ER or return to medical center if symptoms don't improve, worsen or new problems develop. The patient verbalized understanding. The patient was told to call to get lab results if they haven't heard anything in the next week.      Georgian Co, PA-C [facility] and [facility] [place], [place] [phone]   02/03/2023, 9:28 AM

## 2023-02-10 ENCOUNTER — Other Ambulatory Visit: Payer: Self-pay

## 2023-02-22 ENCOUNTER — Ambulatory Visit (HOSPITAL_BASED_OUTPATIENT_CLINIC_OR_DEPARTMENT_OTHER): Payer: No Typology Code available for payment source | Attending: Physician Assistant | Admitting: Internal Medicine

## 2023-02-28 ENCOUNTER — Ambulatory Visit: Payer: No Typology Code available for payment source | Attending: Nurse Practitioner | Admitting: Nurse Practitioner

## 2023-02-28 ENCOUNTER — Encounter: Payer: Self-pay | Admitting: Nurse Practitioner

## 2023-02-28 ENCOUNTER — Other Ambulatory Visit: Payer: Self-pay

## 2023-02-28 VITALS — BP 99/63 | HR 75 | Ht 69.0 in | Wt 214.0 lb

## 2023-02-28 DIAGNOSIS — E1165 Type 2 diabetes mellitus with hyperglycemia: Secondary | ICD-10-CM

## 2023-02-28 MED ORDER — FREESTYLE LIBRE 2 READER DEVI
0 refills | Status: DC
  Filled 2023-02-28: qty 1, fill #0

## 2023-02-28 MED ORDER — FREESTYLE LIBRE 2 SENSOR MISC
6 refills | Status: DC
  Filled 2023-02-28: qty 2, fill #0

## 2023-02-28 NOTE — Progress Notes (Signed)
Assessment & Plan:  Dustin Hale was seen today for diabetes.  Diagnoses and all orders for this visit:  Type 2 diabetes mellitus with hyperglycemia, without long-term current use of insulin (HCC) -     Continuous Glucose Receiver (FREESTYLE LIBRE 2 READER) DEVI; Use as instructed. Check blood glucose level by fingerstick twice per day. E11.65 -     Continuous Glucose Sensor (FREESTYLE LIBRE 2 SENSOR) MISC; Use as instructed. Check blood glucose level by fingerstick twice per day. E11.65    Patient has been counseled on age-appropriate routine health concerns for screening and prevention. These are reviewed and up-to-date. Referrals have been placed accordingly. Immunizations are up-to-date or declined.    Subjective:   Chief Complaint  Patient presents with   Diabetes   PMH: Depression, anxiety, HPL, prediabetes, homelessness, cervical radiculopathy,   Benign prostatic hyperplasia with LUTS, Obstructive sleep apnea syndrome, Low IQ    Dustin Hale 57 y.o. male presents to office today requesting CGM. He was ordered by [facility] by another provider in this office last month and states he never heard from anyone to pick this up. I am unsure if this is covered by his insurance [employer] and have instructed him to stop by the pharmacy to inquire. I also sent the freestyle Josephine Igo as an option but again unsure if insurance will cover as he is not on any insulin and only taking glimepiride Lab Results  Component Value Date   HGBA1C 6.0 02/03/2023    Blood pressure is well controlled.  BP Readings from Last 3 Encounters:  02/28/23 99/63  02/03/23 124/64  09/21/22 123/73    Review of Systems  Constitutional:  Negative for fever, malaise/fatigue and weight loss.  HENT: Negative.  Negative for nosebleeds.   Eyes: Negative.  Negative for blurred vision, double vision and photophobia.  Respiratory: Negative.  Negative for cough and shortness of breath.   Cardiovascular: Negative.  Negative for chest  pain, palpitations and leg swelling.  Gastrointestinal: Negative.  Negative for heartburn, nausea and vomiting.  Musculoskeletal: Negative.  Negative for myalgias.  Neurological: Negative.  Negative for dizziness, focal weakness, seizures and headaches.  Psychiatric/Behavioral: Negative.  Negative for suicidal ideas.     Past Medical History:  Diagnosis Date   Diabetes mellitus without complication (HCC)    Hyperlipidemia    Obesity    Obstructive sleep apnea (adult) (pediatric) 01/09/2022   Poor dental hygiene     Past Surgical History:  Procedure Laterality Date   NO PAST SURGERIES      Family History  Problem Relation Age of Onset   Hypertension Mother    Other Mother        fire   Hypertension Father    Other Father        car crash   Hypertension Maternal Grandmother    Heart attack Maternal Grandmother    Arthritis Maternal Grandmother    Hypertension Maternal Grandfather    Hypertension Paternal Grandmother    Stroke Other    Colon cancer Neg Hx    Rectal cancer Neg Hx    Stomach cancer Neg Hx     Social History Reviewed with no changes to be made today.   Outpatient Medications Prior to Visit  Medication Sig Dispense Refill   blood glucose meter kit and supplies KIT Dispense based on patient and insurance preference. Use up to four times daily as directed. (FOR ICD-9 250.00, 250.01). 1 each 2   Blood Glucose Monitoring Suppl (TRUE METRIX METER) W/DEVICE  KIT 1 each by Does not apply route as needed. 1 kit 0   Continuous Glucose Receiver (DEXCOM G7 RECEIVER) DEVI Does not apply route as needed. 1 each 0   Continuous Glucose Sensor (DEXCOM G7 SENSOR) MISC Replace sensor every 10 days. 1 each 4   diclofenac Sodium (VOLTAREN) 1 % GEL APPLY 4 GRAMS TO AFFECTED AREA FOUR TIMES A DAY AS NEEDED PAIN 100 g 2   DULoxetine (CYMBALTA) 30 MG capsule Take 1 capsule (30 mg total) by mouth daily. 30 capsule 5   glimepiride (AMARYL) 4 MG tablet Take 1 tablet (4 mg total) by  mouth before first meal of the day to lower blood sugar. 30 tablet 4   glucose blood (TRUE METRIX BLOOD GLUCOSE TEST) test strip 1 each by Other route 3 (three) times daily. 100 each 11   lidocaine (LIDODERM) 5 % Place 1 patch onto the skin daily. Remove & Discard patch within 12 hours or as directed by MD 30 patch 0   ondansetron (ZOFRAN ODT) 4 MG disintegrating tablet Take 1 tablet (4 mg total) by mouth every 8 (eight) hours as needed for nausea or vomiting. 8 tablet 0   rosuvastatin (CRESTOR) 20 MG tablet TAKE 1 TABLET (20 MG TOTAL) BY MOUTH DAILY. TO LOWER LIPIDS/CHOLESTEROL 30 tablet 11   tamsulosin (FLOMAX) 0.4 MG CAPS capsule Take 1 capsule (0.4 mg total) by mouth daily. 90 capsule 1   terbinafine (LAMISIL AT) 1 % cream Apply 1 application topically 2 (two) times daily. X 10 days and then as needed 30 g 0   TRUEplus Lancets 28G MISC 1 each by Does not apply route 3 (three) times daily. 100 each 11   No facility-administered medications prior to visit.    No Known Allergies     Objective:    BP 99/63 (BP Location: Left Arm, Patient Position: Sitting, Cuff Size: Normal)   Pulse 75   Ht 5\' 9"  (1.753 m)   Wt 214 lb (97.1 kg)   SpO2 100%   BMI 31.60 kg/m  Wt Readings from Last 3 Encounters:  02/28/23 214 lb (97.1 kg)  02/03/23 217 lb 9.6 oz (98.7 kg)  10/12/22 240 lb (108.9 kg)    Physical Exam Vitals and nursing note reviewed.  Constitutional:      Appearance: He is well-developed.  HENT:     Head: Normocephalic and atraumatic.  Cardiovascular:     Rate and Rhythm: Normal rate and regular rhythm.     Heart sounds: Normal heart sounds. No murmur heard.    No friction rub. No gallop.  Pulmonary:     Effort: Pulmonary effort is normal. No tachypnea or respiratory distress.     Breath sounds: Normal breath sounds. No decreased breath sounds, wheezing, rhonchi or rales.  Chest:     Chest wall: No tenderness.  Abdominal:     General: Bowel sounds are normal.      Palpations: Abdomen is soft.  Musculoskeletal:        General: Normal range of motion.     Cervical back: Normal range of motion.  Skin:    General: Skin is warm and dry.  Neurological:     Mental Status: He is alert and oriented to person, place, and time.     Coordination: Coordination normal.  Psychiatric:        Behavior: Behavior normal. Behavior is cooperative.        Thought Content: Thought content normal.        Judgment:  Judgment normal.          Patient has been counseled extensively about nutrition and exercise as well as the importance of adherence with medications and regular follow-up. The patient was given clear instructions to go to ER or return to medical center if symptoms don't improve, worsen or new problems develop. The patient verbalized understanding.   Follow-up: Return in about 7 weeks (around 04/15/2023).   Claiborne Rigg, FNP-BC [facility] [place], [place] [phone]   02/28/2023, 2:26 PM

## 2023-04-22 ENCOUNTER — Other Ambulatory Visit: Payer: Self-pay

## 2023-04-22 ENCOUNTER — Encounter: Payer: Self-pay | Admitting: Nurse Practitioner

## 2023-04-22 ENCOUNTER — Ambulatory Visit: Payer: No Typology Code available for payment source | Attending: Nurse Practitioner | Admitting: Nurse Practitioner

## 2023-04-22 VITALS — BP 116/78 | HR 69 | Ht 69.0 in | Wt 213.8 lb

## 2023-04-22 DIAGNOSIS — G8929 Other chronic pain: Secondary | ICD-10-CM | POA: Diagnosis not present

## 2023-04-22 DIAGNOSIS — Z7984 Long term (current) use of oral hypoglycemic drugs: Secondary | ICD-10-CM | POA: Diagnosis not present

## 2023-04-22 DIAGNOSIS — E119 Type 2 diabetes mellitus without complications: Secondary | ICD-10-CM

## 2023-04-22 DIAGNOSIS — M25512 Pain in left shoulder: Secondary | ICD-10-CM

## 2023-04-22 MED ORDER — TRUEPLUS LANCETS 28G MISC
3 refills | Status: DC
Start: 2023-04-22 — End: 2024-01-13
  Filled 2023-04-22: qty 100, 50d supply, fill #0

## 2023-04-22 MED ORDER — TRUE METRIX METER W/DEVICE KIT
PACK | 0 refills | Status: AC
Start: 2023-04-22 — End: ?
  Filled 2023-04-22: qty 1, 30d supply, fill #0

## 2023-04-22 MED ORDER — TRUE METRIX BLOOD GLUCOSE TEST VI STRP
ORAL_STRIP | 12 refills | Status: DC
Start: 2023-04-22 — End: 2024-01-13
  Filled 2023-04-22: qty 100, 50d supply, fill #0

## 2023-04-22 NOTE — Progress Notes (Signed)
Assessment & Plan:  [name] was seen today for shoulder pain.  Diagnoses and all orders for this visit:  Chronic left shoulder pain Continue duloxetine as prescribed -     Ambulatory referral to Orthopedics -     DG Shoulder Left; Future  Type 2 diabetes mellitus without complication, without long-term current use of insulin (HCC) -     glucose blood (TRUE METRIX BLOOD GLUCOSE TEST) test strip; Use as instructed. Check blood glucose level by fingerstick twice per day. -     Blood Glucose Monitoring Suppl (TRUE METRIX METER) w/Device KIT; Use as instructed. Check blood glucose level by fingerstick twice per day. -     TRUEplus Lancets 28G MISC; Use as instructed. Check blood glucose level by fingerstick twice per day.    Patient has been counseled on age-appropriate routine health concerns for screening and prevention. These are reviewed and up-to-date. Referrals have been placed accordingly. Immunizations are up-to-date or declined.    Subjective:   Chief Complaint  Patient presents with   Shoulder Pain   Shoulder Pain  Pertinent negatives include no fever.   Dustin Hale 57 y.o. male presents to office today for follow up to ;eft shoulder pain.   He has a history of left rotator cuff rupture with repair. Reports chronic pain for which he takes cymbalta. States pain has not improved.    Wants to know why he does not qualify for dexcom or libre. He was instructed that we do not have any insurance information on file for him and he is also not taking administering any insulin at this time which would require frequent blood glucose fingersticks. Lab Results  Component Value Date   HGBA1C 6.0 02/03/2023     Review of Systems  Constitutional:  Negative for fever, malaise/fatigue and weight loss.  HENT: Negative.  Negative for nosebleeds.   Eyes: Negative.  Negative for blurred vision, double vision and photophobia.  Respiratory: Negative.  Negative for cough and shortness of  breath.   Cardiovascular: Negative.  Negative for chest pain, palpitations and leg swelling.  Gastrointestinal: Negative.  Negative for heartburn, nausea and vomiting.  Musculoskeletal:  Positive for joint pain. Negative for myalgias.  Neurological: Negative.  Negative for dizziness, focal weakness, seizures and headaches.  Psychiatric/Behavioral: Negative.  Negative for suicidal ideas.     Past Medical History:  Diagnosis Date   Diabetes mellitus without complication (HCC)    Hyperlipidemia    Obesity    Obstructive sleep apnea (adult) (pediatric) 01/09/2022   Poor dental hygiene     Past Surgical History:  Procedure Laterality Date   NO PAST SURGERIES      Family History  Problem Relation Age of Onset   Hypertension Mother    Other Mother        fire   Hypertension Father    Other Father        car crash   Hypertension Maternal Grandmother    Heart attack Maternal Grandmother    Arthritis Maternal Grandmother    Hypertension Maternal Grandfather    Hypertension Paternal Grandmother    Stroke Other    Colon cancer Neg Hx    Rectal cancer Neg Hx    Stomach cancer Neg Hx     Social History Reviewed with no changes to be made today.   Outpatient Medications Prior to Visit  Medication Sig Dispense Refill   blood glucose meter kit and supplies KIT Dispense based on patient and insurance preference. Use up  to four times daily as directed. (FOR ICD-9 250.00, 250.01). 1 each 2   diclofenac Sodium (VOLTAREN) 1 % GEL APPLY 4 GRAMS TO AFFECTED AREA FOUR TIMES A DAY AS NEEDED PAIN 100 g 2   DULoxetine (CYMBALTA) 30 MG capsule Take 1 capsule (30 mg total) by mouth daily. 30 capsule 5   glimepiride (AMARYL) 4 MG tablet Take 1 tablet (4 mg total) by mouth before first meal of the day to lower blood sugar. 30 tablet 4   glucose blood (TRUE METRIX BLOOD GLUCOSE TEST) test strip 1 each by Other route 3 (three) times daily. 100 each 11   ondansetron (ZOFRAN ODT) 4 MG disintegrating  tablet Take 1 tablet (4 mg total) by mouth every 8 (eight) hours as needed for nausea or vomiting. 8 tablet 0   rosuvastatin (CRESTOR) 20 MG tablet TAKE 1 TABLET (20 MG TOTAL) BY MOUTH DAILY. TO LOWER LIPIDS/CHOLESTEROL 30 tablet 11   tamsulosin (FLOMAX) 0.4 MG CAPS capsule Take 1 capsule (0.4 mg total) by mouth daily. 90 capsule 1   terbinafine (LAMISIL AT) 1 % cream Apply 1 application topically 2 (two) times daily. X 10 days and then as needed 30 g 0   Blood Glucose Monitoring Suppl (TRUE METRIX METER) W/DEVICE KIT 1 each by Does not apply route as needed. 1 kit 0   TRUEplus Lancets 28G MISC 1 each by Does not apply route 3 (three) times daily. 100 each 11   Continuous Glucose Receiver (DEXCOM G7 RECEIVER) DEVI Does not apply route as needed. (Patient not taking: Reported on 04/22/2023) 1 each 0   Continuous Glucose Receiver (FREESTYLE LIBRE 2 READER) DEVI Use as instructed. Check blood glucose level by fingerstick twice per day. E11.65 (Patient not taking: Reported on 04/22/2023) 1 each 0   Continuous Glucose Sensor (DEXCOM G7 SENSOR) MISC Replace sensor every 10 days. (Patient not taking: Reported on 04/22/2023) 1 each 4   Continuous Glucose Sensor (FREESTYLE LIBRE 2 SENSOR) MISC Use as instructed. Check blood glucose level by fingerstick twice per day. E11.65 (Patient not taking: Reported on 04/22/2023) 2 each 6   lidocaine (LIDODERM) 5 % Place 1 patch onto the skin daily. Remove & Discard patch within 12 hours or as directed by MD (Patient not taking: Reported on 04/22/2023) 30 patch 0   No facility-administered medications prior to visit.    No Known Allergies     Objective:    BP 116/78 (BP Location: Left Arm, Patient Position: Sitting, Cuff Size: Large)   Pulse 69   Ht 5\' 9"  (1.753 m)   Wt 213 lb 12.8 oz (97 kg)   SpO2 97%   BMI 31.57 kg/m  Wt Readings from Last 3 Encounters:  04/22/23 213 lb 12.8 oz (97 kg)  02/28/23 214 lb (97.1 kg)  02/03/23 217 lb 9.6 oz (98.7 kg)     Physical Exam Vitals and nursing note reviewed.  Constitutional:      Appearance: He is well-developed.  HENT:     Head: Normocephalic and atraumatic.  Cardiovascular:     Rate and Rhythm: Normal rate and regular rhythm.     Heart sounds: Normal heart sounds. No murmur heard.    No friction rub. No gallop.  Pulmonary:     Effort: Pulmonary effort is normal. No tachypnea or respiratory distress.     Breath sounds: Normal breath sounds. No decreased breath sounds, wheezing, rhonchi or rales.  Chest:     Chest wall: No tenderness.  Abdominal:  General: Bowel sounds are normal.     Palpations: Abdomen is soft.  Musculoskeletal:     Left shoulder: Decreased range of motion.     Cervical back: Normal range of motion.  Skin:    General: Skin is warm and dry.  Neurological:     Mental Status: He is alert and oriented to person, place, and time.     Coordination: Coordination normal.  Psychiatric:        Behavior: Behavior normal. Behavior is cooperative.        Thought Content: Thought content normal.        Judgment: Judgment normal.          Patient has been counseled extensively about nutrition and exercise as well as the importance of adherence with medications and regular follow-up. The patient was given clear instructions to go to ER or return to medical center if symptoms don't improve, worsen or new problems develop. The patient verbalized understanding.   Follow-up: Return in about 3 months (around 07/23/2023) for a1c.   Claiborne Rigg, FNP-BC [facility] and [facility] [place], [place] [phone]   04/22/2023, 10:39 AM

## 2023-04-22 NOTE — Patient Instructions (Addendum)
r 

## 2023-07-25 ENCOUNTER — Ambulatory Visit: Payer: Medicaid Other | Attending: Nurse Practitioner | Admitting: Nurse Practitioner

## 2023-07-27 ENCOUNTER — Ambulatory Visit: Payer: Medicaid Other | Admitting: Nurse Practitioner

## 2023-08-01 ENCOUNTER — Ambulatory Visit: Payer: Medicaid Other | Attending: Physician Assistant | Admitting: Physician Assistant

## 2023-08-01 ENCOUNTER — Other Ambulatory Visit: Payer: Self-pay

## 2023-08-01 ENCOUNTER — Encounter: Payer: Self-pay | Admitting: Physician Assistant

## 2023-08-01 VITALS — BP 163/79 | HR 74 | Wt 222.0 lb

## 2023-08-01 DIAGNOSIS — Z91199 Patient's noncompliance with other medical treatment and regimen due to unspecified reason: Secondary | ICD-10-CM

## 2023-08-01 DIAGNOSIS — E782 Mixed hyperlipidemia: Secondary | ICD-10-CM

## 2023-08-01 DIAGNOSIS — Z7984 Long term (current) use of oral hypoglycemic drugs: Secondary | ICD-10-CM

## 2023-08-01 DIAGNOSIS — N401 Enlarged prostate with lower urinary tract symptoms: Secondary | ICD-10-CM | POA: Diagnosis not present

## 2023-08-01 DIAGNOSIS — R350 Frequency of micturition: Secondary | ICD-10-CM

## 2023-08-01 DIAGNOSIS — E1165 Type 2 diabetes mellitus with hyperglycemia: Secondary | ICD-10-CM

## 2023-08-01 LAB — GLUCOSE, POCT (MANUAL RESULT ENTRY): POC Glucose: 117 mg/dL — AB (ref 70–99)

## 2023-08-01 MED ORDER — GLIMEPIRIDE 4 MG PO TABS
4.0000 mg | ORAL_TABLET | Freq: Every day | ORAL | 4 refills | Status: DC
Start: 2023-08-01 — End: 2023-10-14
  Filled 2023-08-01 – 2023-08-17 (×2): qty 30, 30d supply, fill #0

## 2023-08-01 MED ORDER — ROSUVASTATIN CALCIUM 20 MG PO TABS
20.0000 mg | ORAL_TABLET | Freq: Every day | ORAL | 11 refills | Status: DC
Start: 2023-08-01 — End: 2024-06-20
  Filled 2023-08-01 – 2023-08-17 (×2): qty 30, 30d supply, fill #0

## 2023-08-01 MED ORDER — TAMSULOSIN HCL 0.4 MG PO CAPS
0.4000 mg | ORAL_CAPSULE | Freq: Every day | ORAL | 1 refills | Status: AC
  Filled 2023-08-01 – 2023-08-17 (×2): qty 90, 90d supply, fill #0
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
## 2023-08-01 NOTE — Patient Instructions (Signed)
Increase your water intake

## 2023-08-02 LAB — COMPREHENSIVE METABOLIC PANEL
ALT: 17 [IU]/L (ref 0–44)
AST: 16 [IU]/L (ref 0–40)
Albumin: 4.1 g/dL (ref 3.8–4.9)
Alkaline Phosphatase: 136 [IU]/L — ABNORMAL HIGH (ref 44–121)
BUN/Creatinine Ratio: 10 (ref 9–20)
BUN: 9 mg/dL (ref 6–24)
Bilirubin Total: 0.3 mg/dL (ref 0.0–1.2)
CO2: 23 mmol/L (ref 20–29)
Calcium: 9.1 mg/dL (ref 8.7–10.2)
Chloride: 106 mmol/L (ref 96–106)
Creatinine, Ser: 0.91 mg/dL (ref 0.76–1.27)
Globulin, Total: 2.6 g/dL (ref 1.5–4.5)
Glucose: 103 mg/dL — ABNORMAL HIGH (ref 70–99)
Potassium: 4.2 mmol/L (ref 3.5–5.2)
Sodium: 144 mmol/L (ref 134–144)
Total Protein: 6.7 g/dL (ref 6.0–8.5)
eGFR: 99 mL/min/{1.73_m2} (ref >59)

## 2023-08-02 LAB — HEMOGLOBIN A1C
Est. average glucose Bld gHb Est-mCnc: 128 mg/dL
Hgb A1c MFr Bld: 6.1 % — ABNORMAL HIGH (ref 4.8–5.6)

## 2023-08-09 ENCOUNTER — Telehealth: Payer: Self-pay

## 2023-08-09 NOTE — Telephone Encounter (Signed)
-----   Message from Georgian Co sent at 08/03/2023 12:59 PM EST ----- Please call patient.  A1C has gone up a little. Work at a goal of eliminating sugary drinks, candy, desserts, sweets, refined sugars, processed foods, and white carbohydrates.  Kidney and liver function are normal.  Thanks, Georgian Co, PA-C

## 2023-08-09 NOTE — Telephone Encounter (Signed)
Pt was called and is aware of results, DOB was confirmed.  ?

## 2023-08-10 ENCOUNTER — Other Ambulatory Visit: Payer: Self-pay

## 2023-08-17 ENCOUNTER — Other Ambulatory Visit: Payer: Self-pay

## 2023-08-17 MED ORDER — AMOXICILLIN 875 MG PO TABS
875.0000 mg | ORAL_TABLET | Freq: Two times a day (BID) | ORAL | 0 refills | Status: DC
  Filled 2023-08-17: qty 20, 10d supply, fill #0

## 2023-10-03 ENCOUNTER — Telehealth: Payer: Self-pay | Admitting: Nurse Practitioner

## 2023-10-03 NOTE — Telephone Encounter (Signed)
Patient identified by name and date of birth.  Patient given contact information to schedule colonoscopy.

## 2023-10-03 NOTE — Telephone Encounter (Signed)
Patient came in requesting to set up a colonoscopy appointment.

## 2023-10-10 ENCOUNTER — Encounter: Payer: Self-pay | Admitting: Gastroenterology

## 2023-10-14 ENCOUNTER — Encounter: Payer: Self-pay | Admitting: Nurse Practitioner

## 2023-10-14 ENCOUNTER — Other Ambulatory Visit: Payer: Self-pay

## 2023-10-14 ENCOUNTER — Ambulatory Visit: Payer: Medicaid Other | Attending: Nurse Practitioner | Admitting: Nurse Practitioner

## 2023-10-14 DIAGNOSIS — E1165 Type 2 diabetes mellitus with hyperglycemia: Secondary | ICD-10-CM

## 2023-10-14 DIAGNOSIS — Z7984 Long term (current) use of oral hypoglycemic drugs: Secondary | ICD-10-CM | POA: Diagnosis not present

## 2023-10-14 MED ORDER — GLIMEPIRIDE 4 MG PO TABS
4.0000 mg | ORAL_TABLET | Freq: Every day | ORAL | 4 refills | Status: AC
  Filled 2023-10-14: qty 30, 30d supply, fill #0

## 2023-10-14 MED ORDER — DEXCOM G7 RECEIVER DEVI
1.0000 | 0 refills | Status: DC | PRN
  Filled 2023-10-14: qty 1, 90d supply, fill #0

## 2023-10-14 MED ORDER — METFORMIN HCL ER 750 MG PO TB24
750.0000 mg | ORAL_TABLET | Freq: Every day | ORAL | 1 refills | Status: AC
  Filled 2023-10-14: qty 90, 90d supply, fill #0

## 2023-10-14 MED ORDER — DEXCOM G7 SENSOR MISC
4 refills | Status: DC
  Filled 2023-10-14: qty 3, 30d supply, fill #0

## 2023-10-14 NOTE — Patient Instructions (Signed)
 [facility] [address] [place], [place]  [address] Phone:  [phone]   Fax:  [phone]

## 2023-10-14 NOTE — Progress Notes (Signed)
 Assessment & Plan:  Dustin Hale was seen today for medical management of chronic issues.  Diagnoses and all orders for this visit:  Type 2 diabetes mellitus with hyperglycemia, without long-term current use of insulin  (HCC) -     Urine Albumin/Creatinine with ratio (send out) [LAB689] -     Ambulatory referral to Ophthalmology -     glimepiride  (AMARYL ) 4 MG tablet; Take 1 tablet (4 mg total) by mouth before first meal of the day to lower blood sugar. -     Continuous Glucose Sensor (DEXCOM G7 SENSOR) MISC; Use as instructed to check blood glucose level continuously. CHANGE SENSOR EVERY 10 DAYS. -     Continuous Glucose Receiver (DEXCOM G7 RECEIVER) DEVI; Use as instructed to check blood glucose level continuously. -     metFORMIN  (GLUCOPHAGE -XR) 750 MG 24 hr tablet; Take 1 tablet (750 mg total) by mouth daily with breakfast.    Patient has been counseled on age-appropriate routine health concerns for screening and prevention. These are reviewed and up-to-date. Referrals have been placed accordingly. Immunizations are up-to-date or declined.    Subjective:   Chief Complaint  Patient presents with   Medical Management of Chronic Issues    Dustin Hale 58 y.o. male presents to office today for follow-up to diabetes.  He has a past medical history of DM, hyperlipidemia, OSA    DM 2 He does not have a blood glucose monitoring device with him today.  Reports metformin  is causing loose stools.  We will switch to XR today.  He has not picked up his continuous glucose monitor.  Currently taking glimepiride  4 mg daily as prescribed.  Blood pressure is well-controlled. Lab Results  Component Value Date   HGBA1C 6.1 (H) 08/01/2023    BP Readings from Last 3 Encounters:  10/14/23 116/66  08/01/23 (!) 163/79  04/22/23 116/78     Review of Systems  Constitutional:  Negative for fever, malaise/fatigue and weight loss.  HENT: Negative.  Negative for nosebleeds.   Eyes: Negative.  Negative  for blurred vision, double vision and photophobia.  Respiratory: Negative.  Negative for cough and shortness of breath.   Cardiovascular: Negative.  Negative for chest pain, palpitations and leg swelling.  Gastrointestinal: Negative.  Negative for heartburn, nausea and vomiting.  Musculoskeletal: Negative.  Negative for myalgias.  Neurological: Negative.  Negative for dizziness, focal weakness, seizures and headaches.  Psychiatric/Behavioral: Negative.  Negative for suicidal ideas.     Past Medical History:  Diagnosis Date   Diabetes mellitus without complication (HCC)    Hyperlipidemia    Obesity    Obstructive sleep apnea (adult) (pediatric) 01/09/2022   Poor dental hygiene     Past Surgical History:  Procedure Laterality Date   NO PAST SURGERIES      Family History  Problem Relation Age of Onset   Hypertension Mother    Other Mother        fire   Hypertension Father    Other Father        car crash   Hypertension Maternal Grandmother    Heart attack Maternal Grandmother    Arthritis Maternal Grandmother    Hypertension Maternal Grandfather    Hypertension Paternal Grandmother    Stroke Other    Colon cancer Neg Hx    Rectal cancer Neg Hx    Stomach cancer Neg Hx     Social History Reviewed with no changes to be made today.   Outpatient Medications Prior to Visit  Medication  Sig Dispense Refill   Blood Glucose Monitoring Suppl (TRUE METRIX METER) w/Device KIT Use as instructed. Check blood glucose level by fingerstick twice per day. 1 kit 0   diclofenac  Sodium (VOLTAREN ) 1 % GEL APPLY 4 GRAMS TO AFFECTED AREA FOUR TIMES A DAY AS NEEDED PAIN 100 g 2   DULoxetine  (CYMBALTA ) 30 MG capsule Take 1 capsule (30 mg total) by mouth daily. 30 capsule 5   glucose blood (TRUE METRIX BLOOD GLUCOSE TEST) test strip 1 each by Other route 3 (three) times daily. 100 each 11   glucose blood (TRUE METRIX BLOOD GLUCOSE TEST) test strip Use as instructed. Check blood glucose level by  fingerstick twice per day. 100 each 12   lidocaine  (LIDODERM ) 5 % Place 1 patch onto the skin daily. Remove & Discard patch within 12 hours or as directed by MD 30 patch 0   ondansetron  (ZOFRAN  ODT) 4 MG disintegrating tablet Take 1 tablet (4 mg total) by mouth every 8 (eight) hours as needed for nausea or vomiting. 8 tablet 0   rosuvastatin  (CRESTOR ) 20 MG tablet TAKE 1 TABLET (20 MG TOTAL) BY MOUTH DAILY. TO LOWER LIPIDS/CHOLESTEROL 30 tablet 11   tamsulosin  (FLOMAX ) 0.4 MG CAPS capsule Take 1 capsule (0.4 mg total) by mouth daily. 90 capsule 1   terbinafine  (LAMISIL  AT) 1 % cream Apply 1 application topically 2 (two) times daily. X 10 days and then as needed 30 g 0   TRUEplus Lancets 28G MISC Use as instructed. Check blood glucose level by fingerstick twice per day. 100 each 3   glimepiride  (AMARYL ) 4 MG tablet Take 1 tablet (4 mg total) by mouth before first meal of the day to lower blood sugar. 30 tablet 4   metFORMIN  (GLUCOPHAGE ) 500 MG tablet Take 500 mg by mouth 2 (two) times daily with a meal.     amoxicillin  (AMOXIL ) 875 MG tablet Take 1 tablet (875 mg total) by mouth 2 (two) times daily until finished. (Patient not taking: Reported on 10/14/2023) 20 tablet 0   blood glucose meter kit and supplies KIT Dispense based on patient and insurance preference. Use up to four times daily as directed. (FOR ICD-9 250.00, 250.01). 1 each 2   Continuous Glucose Receiver (FREESTYLE LIBRE 2 READER) DEVI Use as instructed. Check blood glucose level by fingerstick twice per day. E11.65 (Patient not taking: Reported on 10/14/2023) 1 each 0   Continuous Glucose Sensor (FREESTYLE LIBRE 2 SENSOR) MISC Use as instructed. Check blood glucose level by fingerstick twice per day. E11.65 (Patient not taking: Reported on 10/14/2023) 2 each 6   Continuous Glucose Receiver (DEXCOM G7 RECEIVER) DEVI Does not apply route as needed. (Patient not taking: Reported on 10/14/2023) 1 each 0   Continuous Glucose Sensor (DEXCOM G7 SENSOR)  MISC Replace sensor every 10 days. (Patient not taking: Reported on 10/14/2023) 1 each 4   No facility-administered medications prior to visit.    No Known Allergies     Objective:    BP 116/66 (BP Location: Left Arm, Patient Position: Sitting, Cuff Size: Large)   Pulse 72   Resp 19   Ht 5' 9 (1.753 m)   Wt 219 lb 6.4 oz (99.5 kg)   SpO2 100%   BMI 32.40 kg/m  Wt Readings from Last 3 Encounters:  10/14/23 219 lb 6.4 oz (99.5 kg)  08/01/23 222 lb (100.7 kg)  04/22/23 213 lb 12.8 oz (97 kg)    Physical Exam Vitals and nursing note reviewed.  Constitutional:  Appearance: He is well-developed.  HENT:     Head: Normocephalic and atraumatic.  Cardiovascular:     Rate and Rhythm: Normal rate and regular rhythm.     Heart sounds: Normal heart sounds. No murmur heard.    No friction rub. No gallop.  Pulmonary:     Effort: Pulmonary effort is normal. No tachypnea or respiratory distress.     Breath sounds: Normal breath sounds. No decreased breath sounds, wheezing, rhonchi or rales.  Chest:     Chest wall: No tenderness.  Abdominal:     General: Bowel sounds are normal.     Palpations: Abdomen is soft.  Musculoskeletal:        General: Normal range of motion.     Cervical back: Normal range of motion.  Skin:    General: Skin is warm and dry.  Neurological:     Mental Status: He is alert and oriented to person, place, and time.     Coordination: Coordination normal.  Psychiatric:        Behavior: Behavior normal. Behavior is cooperative.        Thought Content: Thought content normal.        Judgment: Judgment normal.          Patient has been counseled extensively about nutrition and exercise as well as the importance of adherence with medications and regular follow-up. The patient was given clear instructions to go to ER or return to medical center if symptoms don't improve, worsen or new problems develop. The patient verbalized understanding.   Follow-up:  Return in about 3 months (around 01/11/2024).   Haze LELON Servant, FNP-BC [facility] [place], [place] [phone]   10/14/2023, 9:59 AM

## 2023-10-15 LAB — MICROALBUMIN / CREATININE URINE RATIO
Creatinine, Urine: 221 mg/dL
Microalb/Creat Ratio: 5 mg/g{creat} (ref 0–29)
Microalbumin, Urine: 11.6 ug/mL

## 2023-10-26 ENCOUNTER — Other Ambulatory Visit: Payer: Self-pay

## 2023-11-24 DIAGNOSIS — R7303 Prediabetes: Secondary | ICD-10-CM | POA: Insufficient documentation

## 2023-11-24 DIAGNOSIS — F4310 Post-traumatic stress disorder, unspecified: Secondary | ICD-10-CM | POA: Insufficient documentation

## 2023-11-24 DIAGNOSIS — M25562 Pain in left knee: Secondary | ICD-10-CM | POA: Insufficient documentation

## 2023-12-02 ENCOUNTER — Other Ambulatory Visit: Payer: Self-pay

## 2023-12-02 ENCOUNTER — Ambulatory Visit: Payer: Medicaid Other | Admitting: *Deleted

## 2023-12-02 VITALS — Ht 72.0 in | Wt 235.0 lb

## 2023-12-02 DIAGNOSIS — Z860100 Personal history of colon polyps, unspecified: Secondary | ICD-10-CM

## 2023-12-02 MED ORDER — PLENVU 140 G PO SOLR
1.0000 | Freq: Once | ORAL | 0 refills | Status: AC
  Filled 2023-12-02: qty 3, 1d supply, fill #0

## 2023-12-02 NOTE — Progress Notes (Signed)
 Pre visit conducted over the telephone. Patient to pick up instructions with a PLENVU sample.    No egg or soy allergy known to patient  No issues known to pt with past sedation with any surgeries or procedures Patient denies ever being told they had issues or difficulty with intubation  No FH of Malignant Hyperthermia Pt is not on diet pills Pt is not on  home 02  Pt is not on blood thinners  Pt denies issues with constipation  No A fib or A flutter Have any cardiac testing pending--no Pt instructed to use [email or web address] or GoodRx for a price reduction on prep

## 2023-12-04 ENCOUNTER — Encounter: Payer: Self-pay | Admitting: Certified Registered Nurse Anesthetist

## 2023-12-07 ENCOUNTER — Ambulatory Visit: Payer: Medicaid Other | Admitting: Gastroenterology

## 2023-12-07 ENCOUNTER — Other Ambulatory Visit: Payer: Self-pay

## 2023-12-07 ENCOUNTER — Encounter: Payer: Self-pay | Admitting: Gastroenterology

## 2023-12-07 VITALS — BP 137/83 | HR 71 | Temp 98.3°F | Ht 72.0 in | Wt 235.0 lb

## 2023-12-07 DIAGNOSIS — Z860100 Personal history of colon polyps, unspecified: Secondary | ICD-10-CM

## 2023-12-07 MED ORDER — NA SULFATE-K SULFATE-MG SULF 17.5-3.13-1.6 GM/177ML PO SOLN
1.0000 | Freq: Once | ORAL | 0 refills | Status: AC
  Filled 2023-12-07: qty 354, 1d supply, fill #0

## 2023-12-07 MED ORDER — SODIUM CHLORIDE 0.9 % IV SOLN: 500.0000 mL | Freq: Once | INTRAVENOUS | Status: DC

## 2023-12-07 NOTE — Progress Notes (Signed)
 Patient not prepared for today. Offered another appointment today and deferred. To be rescheduled.

## 2023-12-07 NOTE — Progress Notes (Signed)
 Bowel prep inadequate. Pt rescheduled per his choice to 01/17/24 at 7am. Pt very irritable and impatient with staff while trying to check him in and and while trying to review instructions for procedure that was rescheduled. MD asked for pt to take 17grams of Miralax daily and Ducolax 10mg  every other day a week prior to procedure to ensure adequate bowel preparation. RN attempted to review instructions with pt and he was very impatient and did not want to listen. He kept saying "I got it" and wanted to leave without finishing reviewing instructions. Pt left with written instructions and prescription for Suprep sent to pt's pharmacy.

## 2023-12-07 NOTE — Progress Notes (Signed)
 Pt reports last BM is brown liquid and still has some formed stool. MD notified and informed to instruct patient to use fleet enema. Pt is refusing to use enema. MD notified.  Per MD pt can continue or be rescheduled. Pt upset and cussing and states he would rather be rescheduled.

## 2023-12-13 ENCOUNTER — Other Ambulatory Visit: Payer: Self-pay

## 2023-12-19 ENCOUNTER — Other Ambulatory Visit: Payer: Self-pay

## 2024-01-13 ENCOUNTER — Ambulatory Visit: Payer: Medicaid Other | Attending: Nurse Practitioner | Admitting: Nurse Practitioner

## 2024-01-13 ENCOUNTER — Encounter: Payer: Self-pay | Admitting: Nurse Practitioner

## 2024-01-13 VITALS — BP 128/84 | HR 71 | Resp 16 | Ht 72.0 in | Wt 215.0 lb

## 2024-01-13 DIAGNOSIS — E1165 Type 2 diabetes mellitus with hyperglycemia: Secondary | ICD-10-CM | POA: Diagnosis not present

## 2024-01-13 LAB — POCT GLYCOSYLATED HEMOGLOBIN (HGB A1C): HbA1c, POC (prediabetic range): 6.2 % (ref 5.7–6.4)

## 2024-01-13 LAB — GLUCOSE, POCT (MANUAL RESULT ENTRY): POC Glucose: 120 mg/dL — AB (ref 70–99)

## 2024-01-13 NOTE — Progress Notes (Signed)
 Assessment & Plan:  Diagnoses and all orders for this visit:  Type 2 diabetes mellitus with hyperglycemia, without long-term current use of insulin   He declines labs today. States he is not feeling well. Had several teeth extracted and a colonoscopy within the past few days.  -     HgB A1c -     Glucose (CBG) -     CMP14+EGFR  Continue blood sugar control as discussed in office today, low carbohydrate diet, and regular physical exercise as tolerated, 150 minutes per week (30 min each day, 5 days per week, or 50 min 3 days per week). Keep blood sugar logs with fasting goal of 90-130 mg/dl, post prandial (after you eat) less than 180.  For Hypoglycemia: BS <60 and Hyperglycemia BS >400; contact the clinic ASAP. Annual eye exams and foot exams are recommended.     Patient has been counseled on age-appropriate routine health concerns for screening and prevention. These are reviewed and up-to-date. Referrals have been placed accordingly. Immunizations are up-to-date or declined.    Subjective:   Chief Complaint  Patient presents with   Diabetes    Dustin Hale 58 y.o. male presents to office today for Diabetes follow up.  DM 2 Highest overall A1c 12.4. He has been maintaining good diabetes control. He is not monitoring his blood glucose levels at home. He never picked up the metformin  XR that he was switched to due to GI upset. Currently only taking glimepiride  4 mg daily. A1c at goal of <6.5 Lab Results  Component Value Date   HGBA1C 6.2 01/13/2024    Lab Results  Component Value Date   HGBA1C 6.2 01/13/2024    Blood pressure is well controlled.  BP Readings from Last 3 Encounters:  01/13/24 128/84  12/07/23 137/83  10/14/23 116/66      Review of Systems  Constitutional:  Negative for fever, malaise/fatigue and weight loss.  HENT: Negative.  Negative for nosebleeds.   Eyes: Negative.  Negative for blurred vision, double vision and photophobia.  Respiratory: Negative.   Negative for cough and shortness of breath.   Cardiovascular: Negative.  Negative for chest pain, palpitations and leg swelling.  Gastrointestinal: Negative.  Negative for heartburn, nausea and vomiting.  Musculoskeletal: Negative.  Negative for myalgias.  Neurological: Negative.  Negative for dizziness, focal weakness, seizures and headaches.  Psychiatric/Behavioral: Negative.  Negative for suicidal ideas.     Past Medical History:  Diagnosis Date   Diabetes mellitus without complication (HCC)    Hyperlipidemia    Obesity    Obstructive sleep apnea (adult) (pediatric) 01/09/2022   Poor dental hygiene     Past Surgical History:  Procedure Laterality Date   COLONOSCOPY     NO PAST SURGERIES      Family History  Problem Relation Age of Onset   Hypertension Mother    Other Mother        fire   Hypertension Father    Other Father        car crash   Hypertension Maternal Grandmother    Heart attack Maternal Grandmother    Arthritis Maternal Grandmother    Hypertension Maternal Grandfather    Hypertension Paternal Grandmother    Stroke Other    Rectal cancer Neg Hx    Stomach cancer Neg Hx    Colon cancer Neg Hx     Social History Reviewed with no changes to be made today.   Outpatient Medications Prior to Visit  Medication Sig Dispense Refill  glimepiride  (AMARYL ) 4 MG tablet Take 1 tablet (4 mg total) by mouth before first meal of the day to lower blood sugar. 30 tablet 4   metFORMIN  (GLUCOPHAGE -XR) 750 MG 24 hr tablet Take 1 tablet (750 mg total) by mouth daily with breakfast. 90 tablet 1   rosuvastatin  (CRESTOR ) 20 MG tablet TAKE 1 TABLET (20 MG TOTAL) BY MOUTH DAILY. TO LOWER LIPIDS/CHOLESTEROL 30 tablet 11   tamsulosin  (FLOMAX ) 0.4 MG CAPS capsule Take 1 capsule (0.4 mg total) by mouth daily. 90 capsule 1   DULoxetine  (CYMBALTA ) 30 MG capsule Take 1 capsule (30 mg total) by mouth daily. 30 capsule 5   Blood Glucose Monitoring Suppl (TRUE METRIX METER) w/Device KIT  Use as instructed. Check blood glucose level by fingerstick twice per day. 1 kit 0   blood glucose meter kit and supplies KIT Dispense based on patient and insurance preference. Use up to four times daily as directed. (FOR ICD-9 250.00, 250.01). 1 each 2   Continuous Glucose Receiver (DEXCOM G7 RECEIVER) DEVI Use as instructed to check blood glucose level continuously. (Patient not taking: Reported on 12/02/2023) 1 each 0   Continuous Glucose Receiver (FREESTYLE LIBRE 2 READER) DEVI Use as instructed. Check blood glucose level by fingerstick twice per day. E11.65 (Patient not taking: Reported on 10/14/2023) 1 each 0   Continuous Glucose Sensor (DEXCOM G7 SENSOR) MISC Use as instructed to check blood glucose level continuously. CHANGE SENSOR EVERY 10 DAYS. (Patient not taking: Reported on 12/02/2023) 3 each 4   Continuous Glucose Sensor (FREESTYLE LIBRE 2 SENSOR) MISC Use as instructed. Check blood glucose level by fingerstick twice per day. E11.65 (Patient not taking: Reported on 12/02/2023) 2 each 6   diclofenac  Sodium (VOLTAREN ) 1 % GEL APPLY 4 GRAMS TO AFFECTED AREA FOUR TIMES A DAY AS NEEDED PAIN 100 g 2   glucose blood (TRUE METRIX BLOOD GLUCOSE TEST) test strip 1 each by Other route 3 (three) times daily. (Patient not taking: Reported on 01/13/2024) 100 each 11   glucose blood (TRUE METRIX BLOOD GLUCOSE TEST) test strip Use as instructed. Check blood glucose level by fingerstick twice per day. (Patient not taking: Reported on 01/13/2024) 100 each 12   lidocaine  (LIDODERM ) 5 % Place 1 patch onto the skin daily. Remove & Discard patch within 12 hours or as directed by MD (Patient not taking: Reported on 01/13/2024) 30 patch 0   ondansetron  (ZOFRAN  ODT) 4 MG disintegrating tablet Take 1 tablet (4 mg total) by mouth every 8 (eight) hours as needed for nausea or vomiting. (Patient not taking: Reported on 01/13/2024) 8 tablet 0   terbinafine  (LAMISIL  AT) 1 % cream Apply 1 application topically 2 (two) times daily. X  10 days and then as needed (Patient not taking: Reported on 01/13/2024) 30 g 0   TRUEplus Lancets 28G MISC Use as instructed. Check blood glucose level by fingerstick twice per day. (Patient not taking: Reported on 01/13/2024) 100 each 3   Facility-Administered Medications Prior to Visit  Medication Dose Route Frequency Provider Last Rate Last Admin   0.9 %  sodium chloride  infusion  500 mL Intravenous Once Mansouraty, Albino Alu., MD        No Known Allergies     Objective:    BP 128/84 (Cuff Size: Large)   Pulse 71   Resp 16   Ht 6' (1.829 m)   Wt 215 lb (97.5 kg)   SpO2 97%   BMI 29.16 kg/m  Wt Readings from Last 3 Encounters:  01/13/24 215 lb (97.5 kg)  12/07/23 235 lb (106.6 kg)  12/02/23 235 lb (106.6 kg)    Physical Exam Vitals and nursing note reviewed.  Constitutional:      Appearance: He is well-developed.  HENT:     Head: Normocephalic and atraumatic.  Cardiovascular:     Rate and Rhythm: Normal rate and regular rhythm.     Heart sounds: Normal heart sounds. No murmur heard.    No friction rub. No gallop.  Pulmonary:     Effort: Pulmonary effort is normal. No tachypnea or respiratory distress.     Breath sounds: Normal breath sounds. No decreased breath sounds, wheezing, rhonchi or rales.  Chest:     Chest wall: No tenderness.  Abdominal:     General: Bowel sounds are normal.     Palpations: Abdomen is soft.  Musculoskeletal:        General: Normal range of motion.     Cervical back: Normal range of motion.  Skin:    General: Skin is warm and dry.  Neurological:     Mental Status: He is alert and oriented to person, place, and time.     Coordination: Coordination normal.  Psychiatric:        Behavior: Behavior normal. Behavior is cooperative.        Thought Content: Thought content normal.        Judgment: Judgment normal.          Patient has been counseled extensively about nutrition and exercise as well as the importance of adherence with  medications and regular follow-up. The patient was given clear instructions to go to ER or return to medical center if symptoms don't improve, worsen or new problems develop. The patient verbalized understanding.   Follow-up: Return in about 3 months (around 04/14/2024).   Collins Dean, FNP-BC [facility] [place], [place] [phone]   01/13/2024, 9:01 AM

## 2024-01-17 ENCOUNTER — Encounter: Payer: Self-pay | Admitting: Gastroenterology

## 2024-01-17 ENCOUNTER — Ambulatory Visit (AMBULATORY_SURGERY_CENTER): Admitting: Gastroenterology

## 2024-01-17 VITALS — BP 96/62 | HR 64 | Temp 98.6°F | Resp 10 | Ht 72.0 in | Wt 235.0 lb

## 2024-01-17 DIAGNOSIS — Z1211 Encounter for screening for malignant neoplasm of colon: Secondary | ICD-10-CM | POA: Diagnosis not present

## 2024-01-17 DIAGNOSIS — K641 Second degree hemorrhoids: Secondary | ICD-10-CM | POA: Diagnosis not present

## 2024-01-17 DIAGNOSIS — Z860101 Personal history of adenomatous and serrated colon polyps: Secondary | ICD-10-CM

## 2024-01-17 DIAGNOSIS — D123 Benign neoplasm of transverse colon: Secondary | ICD-10-CM | POA: Diagnosis not present

## 2024-01-17 DIAGNOSIS — D122 Benign neoplasm of ascending colon: Secondary | ICD-10-CM | POA: Diagnosis not present

## 2024-01-17 DIAGNOSIS — Z860100 Personal history of colon polyps, unspecified: Secondary | ICD-10-CM

## 2024-01-17 MED ORDER — SODIUM CHLORIDE 0.9 % IV SOLN
500.0000 mL | Freq: Once | INTRAVENOUS | Status: DC
Start: 2024-01-17 — End: 2024-01-17

## 2024-01-17 NOTE — Progress Notes (Signed)
 GASTROENTEROLOGY PROCEDURE H&P NOTE   Primary Care Physician: Collins Dean, NP  HPI: Dustin Hale is a 58 y.o. male who presents for Colonoscopy for surveillance of previous adenomas.  Past Medical History:  Diagnosis Date   Diabetes mellitus without complication (HCC)    Hyperlipidemia    Obesity    Obstructive sleep apnea (adult) (pediatric) 01/09/2022   Poor dental hygiene    Past Surgical History:  Procedure Laterality Date   COLONOSCOPY     NO PAST SURGERIES     Current Outpatient Medications  Medication Sig Dispense Refill   Blood Glucose Monitoring Suppl (TRUE METRIX METER) w/Device KIT Use as instructed. Check blood glucose level by fingerstick twice per day. 1 kit 0   glimepiride  (AMARYL ) 4 MG tablet Take 1 tablet (4 mg total) by mouth before first meal of the day to lower blood sugar. 30 tablet 4   metFORMIN  (GLUCOPHAGE -XR) 750 MG 24 hr tablet Take 1 tablet (750 mg total) by mouth daily with breakfast. 90 tablet 1   rosuvastatin  (CRESTOR ) 20 MG tablet TAKE 1 TABLET (20 MG TOTAL) BY MOUTH DAILY. TO LOWER LIPIDS/CHOLESTEROL 30 tablet 11   tamsulosin  (FLOMAX ) 0.4 MG CAPS capsule Take 1 capsule (0.4 mg total) by mouth daily. 90 capsule 1   Current Facility-Administered Medications  Medication Dose Route Frequency Provider Last Rate Last Admin   0.9 %  sodium chloride  infusion  500 mL Intravenous Once Mansouraty, Sonita Michiels Jr., MD        Current Outpatient Medications:    Blood Glucose Monitoring Suppl (TRUE METRIX METER) w/Device KIT, Use as instructed. Check blood glucose level by fingerstick twice per day., Disp: 1 kit, Rfl: 0   glimepiride  (AMARYL ) 4 MG tablet, Take 1 tablet (4 mg total) by mouth before first meal of the day to lower blood sugar., Disp: 30 tablet, Rfl: 4   metFORMIN  (GLUCOPHAGE -XR) 750 MG 24 hr tablet, Take 1 tablet (750 mg total) by mouth daily with breakfast., Disp: 90 tablet, Rfl: 1   rosuvastatin  (CRESTOR ) 20 MG tablet, TAKE 1 TABLET (20 MG  TOTAL) BY MOUTH DAILY. TO LOWER LIPIDS/CHOLESTEROL, Disp: 30 tablet, Rfl: 11   tamsulosin  (FLOMAX ) 0.4 MG CAPS capsule, Take 1 capsule (0.4 mg total) by mouth daily., Disp: 90 capsule, Rfl: 1  Current Facility-Administered Medications:    0.9 %  sodium chloride  infusion, 500 mL, Intravenous, Once, Mansouraty, Albino Alu., MD No Known Allergies Family History  Problem Relation Age of Onset   Hypertension Mother    Other Mother        fire   Hypertension Father    Other Father        car crash   Hypertension Maternal Grandmother    Heart attack Maternal Grandmother    Arthritis Maternal Grandmother    Hypertension Maternal Grandfather    Hypertension Paternal Grandmother    Stroke Other    Rectal cancer Neg Hx    Stomach cancer Neg Hx    Colon cancer Neg Hx    Esophageal cancer Neg Hx    Social History   Socioeconomic History   Marital status: Single    Spouse name: Not on file   Number of children: 4   Years of education: 2 yrs college   Highest education level: Not on file  Occupational History   Not on file  Tobacco Use   Smoking status: Former    Current packs/day: 0.33    Average packs/day: 0.3 packs/day for 32.0 years (10.6 ttl  pk-yrs)    Types: Cigarettes   Smokeless tobacco: Never   Tobacco comments:    quit smoking on 08/27/14  Vaping Use   Vaping status: Never Used  Substance and Sexual Activity   Alcohol use: Yes    Comment: occasional    Drug use: Not Currently    Types: Marijuana    Comment: Last use August 2019   Sexual activity: Yes    Partners: Female  Other Topics Concern   Not on file  Social History Narrative   Lives with sig other   No caffeine   Social Drivers of Corporate investment banker Strain: Low Risk  (01/13/2024)   Overall Financial Resource Strain (CARDIA)    Difficulty of Paying Living Expenses: Not very hard  Food Insecurity: No Food Insecurity (01/13/2024)   Hunger Vital Sign    Worried About Running Out of Food in the Last  Year: Never true    Ran Out of Food in the Last Year: Never true  Transportation Needs: No Transportation Needs (01/13/2024)   PRAPARE - Administrator, Civil Service (Medical): No    Lack of Transportation (Non-Medical): No  Physical Activity: Insufficiently Active (01/13/2024)   Exercise Vital Sign    Days of Exercise per Week: 2 days    Minutes of Exercise per Session: 50 min  Stress: No Stress Concern Present (01/13/2024)   [employer] of Occupational Health - Occupational Stress Questionnaire    Feeling of Stress : Not at all  Social Connections: Moderately Integrated (01/13/2024)   Social Connection and Isolation Panel [NHANES]    Frequency of Communication with Friends and Family: More than three times a week    Frequency of Social Gatherings with Friends and Family: More than three times a week    Attends Religious Services: More than 4 times per year    Active Member of [employer] or Organizations: Yes    Attends Banker Meetings: More than 4 times per year    Marital Status: Divorced  Intimate Partner Violence: Not At Risk (01/13/2024)   Humiliation, Afraid, Rape, and Kick questionnaire    Fear of Current or Ex-Partner: No    Emotionally Abused: No    Physically Abused: No    Sexually Abused: No    Physical Exam: Today's Vitals   01/17/24 0724  BP: 123/71  Pulse: 75  Temp: 98.6 F (37 C)  SpO2: 97%  Weight: 235 lb (106.6 kg)  Height: 6' (1.829 m)   Body mass index is 31.87 kg/m. GEN: NAD EYE: Sclerae anicteric ENT: MMM CV: Non-tachycardic GI: Soft, NT/ND NEURO:  Alert & Oriented x 3  Lab Results: No results for input(s): "WBC", "HGB", "HCT", "PLT" in the last 72 hours. BMET No results for input(s): "NA", "K", "CL", "CO2", "GLUCOSE", "BUN", "CREATININE", "CALCIUM " in the last 72 hours. LFT No results for input(s): "PROT", "ALBUMIN", "AST", "ALT", "ALKPHOS", "BILITOT", "BILIDIR", "IBILI" in the last 72 hours. PT/INR No results for  input(s): "LABPROT", "INR" in the last 72 hours.   Impression / Plan: This is a 58 y.o.male who presents for Colonoscopy for surveillance of previous adenomas.  The risks and benefits of endoscopic evaluation/treatment were discussed with the patient and/or family; these include but are not limited to the risk of perforation, infection, bleeding, missed lesions, lack of diagnosis, severe illness requiring hospitalization, as well as anesthesia and sedation related illnesses.  The patient's history has been reviewed, patient examined, no change in status, and deemed  stable for procedure.  The patient and/or family is agreeable to proceed.    Yong Henle, MD [facility] Advanced Endoscopy Office # [phone]

## 2024-01-17 NOTE — Op Note (Signed)
 [facility] Patient Name: Dustin Hale Procedure Date: 01/17/2024 7:34 AM MRN: [id number] Endoscopist: Yong Henle , MD, [id number] Age: 58 Referring MD:  Date of Birth: [birth year] Gender: Male Account #: [id number] Procedure:                Colonoscopy Indications:              Surveillance: Personal history of adenomatous                            polyps on last colonoscopy 5 years ago Medicines:                Monitored Anesthesia Care Procedure:                Pre-Anesthesia Assessment:                           - Prior to the procedure, a History and Physical                            was performed, and patient medications and                            allergies were reviewed. The patient's tolerance of                            previous anesthesia was also reviewed. The risks                            and benefits of the procedure and the sedation                            options and risks were discussed with the patient.                            All questions were answered, and informed consent                            was obtained. Prior Anticoagulants: The patient has                            taken no anticoagulant or antiplatelet agents. ASA                            Grade Assessment: III - A patient with severe                            systemic disease. After reviewing the risks and                            benefits, the patient was deemed in satisfactory                            condition to undergo the procedure.  After obtaining informed consent, the colonoscope                            was passed under direct vision. Throughout the                            procedure, the patient's blood pressure, pulse, and                            oxygen saturations were monitored continuously. The                            CF [id number] #[id number] was introduced through the anus                            and advanced to  the the cecum, identified by                            appendiceal orifice and ileocecal valve. The                            colonoscopy was performed without difficulty. The                            patient tolerated the procedure. The quality of the                            bowel preparation was adequate. The ileocecal                            valve, appendiceal orifice, and rectum were                            photographed. Scope In: 7:58:23 AM Scope Out: 8:14:26 AM Scope Withdrawal Time: 0 hours 12 minutes 56 seconds  Total Procedure Duration: 0 hours 16 minutes 3 seconds  Findings:                 The digital rectal exam findings include                            hemorrhoids. Pertinent negatives include no                            palpable rectal lesions.                           A large amount of semi-liquid stool was found in                            the entire colon, interfering with visualization.                            Lavage of the area was performed using copious  amounts, resulting in clearance with adequate                            visualization.                           Three sessile polyps were found in the splenic                            flexure, transverse colon and ascending colon. The                            polyps were 4 to 8 mm in size. These polyps were                            removed with a cold snare. Resection and retrieval                            were complete.                           Normal mucosa was found in the entire colon                            otherwise.                           Non-bleeding non-thrombosed internal hemorrhoids                            were found during retroflexion, during perianal                            exam and during digital exam. The hemorrhoids were                            Grade II (internal hemorrhoids that prolapse but                            reduce  spontaneously). Complications:            No immediate complications. Estimated Blood Loss:     Estimated blood loss was minimal. Impression:               - Hemorrhoids found on digital rectal exam.                           - Stool in the entire examined colon. Lavaged with                            adequate visualization.                           - Three 4 to 8 mm polyps at the splenic flexure, in  the transverse colon and in the ascending colon,                            removed with a cold snare. Resected and retrieved.                           - Normal mucosa in the entire examined colon                            otherwise.                           - Non-bleeding non-thrombosed internal hemorrhoids. Recommendation:           - The patient will be observed post-procedure,                            until all discharge criteria are met.                           - Discharge patient to home.                           - Patient has a contact number available for                            emergencies. The signs and symptoms of potential                            delayed complications were discussed with the                            patient. Return to normal activities tomorrow.                            Written discharge instructions were provided to the                            patient.                           - High fiber diet.                           - Use FiberCon 1-2 tablets PO daily.                           - Continue present medications.                           - Await pathology results.                           - Repeat colonoscopy in 3/5/7 years for                            surveillance based on  pathology results. Recommend                            adding 1 week of daily MiraLAX to improve                            visualization prior to next reparation.                           - The findings and recommendations were  discussed                            with the patient.                           - The findings and recommendations were discussed                            with the designated responsible adult. Yong Henle, MD 01/17/2024 8:19:06 AM

## 2024-01-17 NOTE — Progress Notes (Signed)
 Vss nad trans to pacu

## 2024-01-17 NOTE — Progress Notes (Signed)
 Pt's states no medical or surgical changes since previsit or office visit.

## 2024-01-17 NOTE — Patient Instructions (Signed)
 Handout provided on polyps.  Follow up recommendations will be sent via MyChart or letter.    YOU HAD AN ENDOSCOPIC PROCEDURE TODAY AT THE [facility]:   Refer to the procedure report that was given to you for any specific questions about what was found during the examination.  If the procedure report does not answer your questions, please call your gastroenterologist to clarify.  If you requested that your care partner not be given the details of your procedure findings, then the procedure report has been included in a sealed envelope for you to review at your convenience later.  YOU SHOULD EXPECT: Some feelings of bloating in the abdomen. Passage of more gas than usual.  Walking can help get rid of the air that was put into your GI tract during the procedure and reduce the bloating. If you had a lower endoscopy (such as a colonoscopy or flexible sigmoidoscopy) you may notice spotting of blood in your stool or on the toilet paper. If you underwent a bowel prep for your procedure, you may not have a normal bowel movement for a few days.  Please Note:  You might notice some irritation and congestion in your nose or some drainage.  This is from the oxygen used during your procedure.  There is no need for concern and it should clear up in a day or so.  SYMPTOMS TO REPORT IMMEDIATELY:  Following lower endoscopy (colonoscopy or flexible sigmoidoscopy):  Excessive amounts of blood in the stool  Significant tenderness or worsening of abdominal pains  Swelling of the abdomen that is new, acute  Fever of 100F or higher  For urgent or emergent issues, a gastroenterologist can be reached at any hour by calling [phone]. Do not use MyChart messaging for urgent concerns.    DIET:  We do recommend a small meal at first, but then you may proceed to your regular diet.  Drink plenty of fluids but you should avoid alcoholic beverages for 24 hours.  ACTIVITY:  You should plan to take it easy  for the rest of today and you should NOT DRIVE or use heavy machinery until tomorrow (because of the sedation medicines used during the test).    FOLLOW UP: Our staff will call the number listed on your records the next business day following your procedure.  We will call around 7:15- 8:00 am to check on you and address any questions or concerns that you may have regarding the information given to you following your procedure. If we do not reach you, we will leave a message.     If any biopsies were taken you will be contacted by phone or by letter within the next 1-3 weeks.  Please call us  at [phone] if you have not heard about the biopsies in 3 weeks.    SIGNATURES/CONFIDENTIALITY: You and/or your care partner have signed paperwork which will be entered into your electronic medical record.  These signatures attest to the fact that that the information above on your After Visit Summary has been reviewed and is understood.  Full responsibility of the confidentiality of this discharge information lies with you and/or your care-partner.

## 2024-01-17 NOTE — Progress Notes (Signed)
 Pt verbally aggressive to staff in recovery.  Pt repeated that he was ready to go and using curse words.  Education provided on recovery time in PACU.  Pt refused education and continued to state that he is ready to go.  AVS reviewed with pt and care partner at bedside.  Instructions given on pt not to drive today as he would be driving while impaired.  Pt transported to his car via wheelchair by transport staff.  Per transport staff, pt refused to let care partner drive home and drove himself home.  MD notified.

## 2024-01-17 NOTE — Progress Notes (Signed)
 Called to room to assist during endoscopic procedure.  Patient ID and intended procedure confirmed with present staff. Received instructions for my participation in the procedure from the performing physician.

## 2024-01-18 ENCOUNTER — Telehealth: Payer: Self-pay

## 2024-01-18 NOTE — Telephone Encounter (Signed)
  Follow up Call-     01/17/2024    7:25 AM 12/07/2023    7:58 AM  Call back number  Post procedure Call Back phone  # [phone]  Permission to leave phone message Yes Yes     Patient questions:  Do you have a fever, pain , or abdominal swelling? No. Pain Score  0 *  Have you tolerated food without any problems? Yes.    Have you been able to return to your normal activities? Yes.    Do you have any questions about your discharge instructions: Diet   No. Medications  No. Follow up visit  No.  Do you have questions or concerns about your Care? No.  Actions: * If pain score is 4 or above: No action needed, pain <4.

## 2024-01-19 LAB — SURGICAL PATHOLOGY

## 2024-01-22 ENCOUNTER — Ambulatory Visit: Payer: Self-pay | Admitting: Gastroenterology

## 2024-04-13 ENCOUNTER — Telehealth: Payer: Self-pay | Admitting: Nurse Practitioner

## 2024-04-13 NOTE — Telephone Encounter (Signed)
 Pt confirmed appt

## 2024-04-16 ENCOUNTER — Ambulatory Visit: Admitting: Nurse Practitioner

## 2024-05-09 ENCOUNTER — Telehealth: Payer: Self-pay | Admitting: Nurse Practitioner

## 2024-05-09 NOTE — Telephone Encounter (Signed)
 Pt confirmed appt 9/2 (per vr)

## 2024-05-11 ENCOUNTER — Ambulatory Visit: Admitting: Nurse Practitioner

## 2024-06-08 ENCOUNTER — Telehealth: Payer: Self-pay | Admitting: Nurse Practitioner

## 2024-06-08 NOTE — Telephone Encounter (Signed)
 Patient identified by name and date of birth.  Patient has been made aware that the forms have to be completed by him. There is nowhere on the forms that the provider has to fill out and or sign.  Patient was a confused until I advised him that the provider did take a look at the paper work and confirmed. Patient then voiced understanding and hung up.

## 2024-06-08 NOTE — Telephone Encounter (Addendum)
 Patient came in the office today and dropped off [id number] Form. Form has been placed in the providers box for review.   Patient was informed that any form requiring completion will take 7-14 business days.

## 2024-06-12 ENCOUNTER — Encounter: Payer: Self-pay | Admitting: Nurse Practitioner

## 2024-06-12 ENCOUNTER — Ambulatory Visit: Attending: Nurse Practitioner | Admitting: Nurse Practitioner

## 2024-06-12 VITALS — BP 129/85 | HR 88 | Resp 19 | Ht 72.0 in | Wt 220.2 lb

## 2024-06-12 DIAGNOSIS — Z7984 Long term (current) use of oral hypoglycemic drugs: Secondary | ICD-10-CM

## 2024-06-12 DIAGNOSIS — E7800 Pure hypercholesterolemia, unspecified: Secondary | ICD-10-CM

## 2024-06-12 DIAGNOSIS — E1165 Type 2 diabetes mellitus with hyperglycemia: Secondary | ICD-10-CM | POA: Diagnosis not present

## 2024-06-12 NOTE — Progress Notes (Signed)
 Assessment & Plan:  Dustin Hale was seen today for medical management of chronic issues.  Diagnoses and all orders for this visit:  Type 2 diabetes mellitus with hyperglycemia, without long-term current use of insulin  (HCC) -     Hemoglobin A1c -     CMP14+EGFR Managing with glimepiride  and metformin , though metformin  adherence is inconsistent. Previous A1c was well-controlled. - Order A1c test. - Order kidney function tests.   Hypercholesterolemia -     Lipid panel INSTRUCTIONS: Work on a low fat, heart healthy diet and participate in regular aerobic exercise program by working out at least 150 minutes per week; 5 days a week-30 minutes per day. Avoid red meat/beef/steak,  fried foods. junk foods, sodas, sugary drinks, unhealthy snacking, alcohol and smoking.  Drink at least 80 oz of water per day and monitor your carbohydrate intake daily.     Patient has been counseled on age-appropriate routine health concerns for screening and prevention. These are reviewed and up-to-date. Referrals have been placed accordingly. Immunizations are up-to-date or declined.    Subjective:   Chief Complaint  Patient presents with   Medical Management of Chronic Issues    History of Present Illness Dustin Hale is a 58 year old male with diabetes who presents for routine lab work and medication review.  DM 2 A1c at goal. He is managing his diabetes with glimepiride , taken regularly, but does not consistently take metformin , describing his use as 'dibble and dabble' due to GI upset. Lab Results  Component Value Date   HGBA1C 6.2 01/13/2024    He has disability paperwork today that he needs to complete and we have been requested to submit his medical records.    ROS  Past Medical History:  Diagnosis Date   Diabetes mellitus without complication (HCC)    Hyperlipidemia    Obesity    Obstructive sleep apnea (adult) (pediatric) 01/09/2022   Poor dental hygiene     Past Surgical History:   Procedure Laterality Date   COLONOSCOPY     NO PAST SURGERIES      Family History  Problem Relation Age of Onset   Hypertension Mother    Other Mother        fire   Hypertension Father    Other Father        car crash   Hypertension Maternal Grandmother    Heart attack Maternal Grandmother    Arthritis Maternal Grandmother    Hypertension Maternal Grandfather    Hypertension Paternal Grandmother    Stroke Other    Rectal cancer Neg Hx    Stomach cancer Neg Hx    Colon cancer Neg Hx    Esophageal cancer Neg Hx     Social History Reviewed with no changes to be made today.   Outpatient Medications Prior to Visit  Medication Sig Dispense Refill   glimepiride  (AMARYL ) 4 MG tablet Take 1 tablet (4 mg total) by mouth before first meal of the day to lower blood sugar. 30 tablet 4   metFORMIN  (GLUCOPHAGE -XR) 750 MG 24 hr tablet Take 1 tablet (750 mg total) by mouth daily with breakfast. 90 tablet 1   rosuvastatin  (CRESTOR ) 20 MG tablet TAKE 1 TABLET (20 MG TOTAL) BY MOUTH DAILY. TO LOWER LIPIDS/CHOLESTEROL 30 tablet 11   tamsulosin  (FLOMAX ) 0.4 MG CAPS capsule Take 1 capsule (0.4 mg total) by mouth daily. 90 capsule 1   Blood Glucose Monitoring Suppl (TRUE METRIX METER) w/Device KIT Use as instructed. Check blood glucose level  by fingerstick twice per day. (Patient not taking: Reported on 06/12/2024) 1 kit 0   No facility-administered medications prior to visit.    No Known Allergies     Objective:    BP 129/85 (BP Location: Left Arm, Patient Position: Sitting, Cuff Size: Normal)   Pulse 88   Resp 19   Ht 6' (1.829 m)   Wt 220 lb 3.2 oz (99.9 kg)   SpO2 97%   BMI 29.86 kg/m  Wt Readings from Last 3 Encounters:  06/12/24 220 lb 3.2 oz (99.9 kg)  01/17/24 235 lb (106.6 kg)  01/13/24 215 lb (97.5 kg)    Physical Exam Vitals and nursing note reviewed.  Constitutional:      Appearance: He is well-developed.  HENT:     Head: Normocephalic and atraumatic.   Cardiovascular:     Rate and Rhythm: Normal rate and regular rhythm.     Heart sounds: Normal heart sounds. No murmur heard.    No friction rub. No gallop.  Pulmonary:     Effort: Pulmonary effort is normal. No tachypnea or respiratory distress.     Breath sounds: Normal breath sounds. No decreased breath sounds, wheezing, rhonchi or rales.  Chest:     Chest wall: No tenderness.  Abdominal:     General: Bowel sounds are normal.     Palpations: Abdomen is soft.  Musculoskeletal:        General: Normal range of motion.     Cervical back: Normal range of motion.  Skin:    General: Skin is warm and dry.  Neurological:     Mental Status: He is alert and oriented to person, place, and time.     Coordination: Coordination normal.  Psychiatric:        Behavior: Behavior normal. Behavior is cooperative.        Thought Content: Thought content normal.        Judgment: Judgment normal.          Patient has been counseled extensively about nutrition and exercise as well as the importance of adherence with medications and regular follow-up. The patient was given clear instructions to go to ER or return to medical center if symptoms don't improve, worsen or new problems develop. The patient verbalized understanding.   Follow-up: Return in about 6 months (around 12/11/2024).   Haze LELON Servant, FNP-BC [facility] [place], [place] [phone]   06/12/2024, 10:50 AM

## 2024-06-13 LAB — CMP14+EGFR
ALT: 19 IU/L (ref 0–44)
AST: 16 IU/L (ref 0–40)
Albumin: 4.3 g/dL (ref 3.8–4.9)
Alkaline Phosphatase: 145 IU/L — ABNORMAL HIGH (ref 47–123)
BUN/Creatinine Ratio: 11 (ref 9–20)
BUN: 12 mg/dL (ref 6–24)
Bilirubin Total: 0.3 mg/dL (ref 0.0–1.2)
CO2: 23 mmol/L (ref 20–29)
Calcium: 9.5 mg/dL (ref 8.7–10.2)
Chloride: 104 mmol/L (ref 96–106)
Creatinine, Ser: 1.06 mg/dL (ref 0.76–1.27)
Globulin, Total: 2.4 g/dL (ref 1.5–4.5)
Glucose: 124 mg/dL — ABNORMAL HIGH (ref 70–99)
Potassium: 3.9 mmol/L (ref 3.5–5.2)
Sodium: 142 mmol/L (ref 134–144)
Total Protein: 6.7 g/dL (ref 6.0–8.5)
eGFR: 82 mL/min/1.73 (ref >59)

## 2024-06-13 LAB — LIPID PANEL
Chol/HDL Ratio: 4.3 ratio (ref 0.0–5.0)
Cholesterol, Total: 184 mg/dL (ref 100–199)
HDL: 43 mg/dL (ref >39)
LDL Chol Calc (NIH): 120 mg/dL — ABNORMAL HIGH (ref 0–99)
Triglycerides: 115 mg/dL (ref 0–149)
VLDL Cholesterol Cal: 21 mg/dL (ref 5–40)

## 2024-06-13 LAB — HEMOGLOBIN A1C
Est. average glucose Bld gHb Est-mCnc: 169 mg/dL
Hgb A1c MFr Bld: 7.5 % — ABNORMAL HIGH (ref 4.8–5.6)

## 2024-06-20 ENCOUNTER — Other Ambulatory Visit: Payer: Self-pay | Admitting: Nurse Practitioner

## 2024-06-20 ENCOUNTER — Ambulatory Visit: Payer: Self-pay | Admitting: Nurse Practitioner

## 2024-06-20 DIAGNOSIS — E782 Mixed hyperlipidemia: Secondary | ICD-10-CM

## 2024-06-20 MED ORDER — ROSUVASTATIN CALCIUM 20 MG PO TABS
20.0000 mg | ORAL_TABLET | Freq: Every day | ORAL | 3 refills | Status: AC
  Filled 2024-06-20: qty 90, 90d supply, fill #0

## 2024-06-21 ENCOUNTER — Other Ambulatory Visit: Payer: Self-pay

## 2024-07-02 ENCOUNTER — Other Ambulatory Visit: Payer: Self-pay

## 2024-12-11 ENCOUNTER — Ambulatory Visit: Admitting: Nurse Practitioner
